# Patient Record
Sex: Female | Born: 1955 | Race: White | Hispanic: No | Smoking: Former smoker
Health system: Southern US, Community
[De-identification: ages and names within clinical notes are randomized; demographics above are authoritative.]

## PROBLEM LIST (undated history)

## (undated) DIAGNOSIS — F419 Anxiety disorder, unspecified: Secondary | ICD-10-CM

## (undated) DIAGNOSIS — J45909 Unspecified asthma, uncomplicated: Secondary | ICD-10-CM

## (undated) DIAGNOSIS — G4730 Sleep apnea, unspecified: Secondary | ICD-10-CM

## (undated) DIAGNOSIS — O24419 Gestational diabetes mellitus in pregnancy, unspecified control: Secondary | ICD-10-CM

## (undated) DIAGNOSIS — R112 Nausea with vomiting, unspecified: Secondary | ICD-10-CM

## (undated) DIAGNOSIS — K5900 Constipation, unspecified: Secondary | ICD-10-CM

## (undated) DIAGNOSIS — I10 Essential (primary) hypertension: Secondary | ICD-10-CM

## (undated) DIAGNOSIS — E079 Disorder of thyroid, unspecified: Secondary | ICD-10-CM

## (undated) DIAGNOSIS — E785 Hyperlipidemia, unspecified: Secondary | ICD-10-CM

## (undated) DIAGNOSIS — M1990 Unspecified osteoarthritis, unspecified site: Secondary | ICD-10-CM

## (undated) DIAGNOSIS — N200 Calculus of kidney: Secondary | ICD-10-CM

## (undated) DIAGNOSIS — T8859XA Other complications of anesthesia, initial encounter: Secondary | ICD-10-CM

## (undated) DIAGNOSIS — F329 Major depressive disorder, single episode, unspecified: Secondary | ICD-10-CM

## (undated) DIAGNOSIS — C73 Malignant neoplasm of thyroid gland: Secondary | ICD-10-CM

## (undated) DIAGNOSIS — C801 Malignant (primary) neoplasm, unspecified: Secondary | ICD-10-CM

## (undated) DIAGNOSIS — E039 Hypothyroidism, unspecified: Secondary | ICD-10-CM

## (undated) DIAGNOSIS — F32A Depression, unspecified: Secondary | ICD-10-CM

## (undated) DIAGNOSIS — K219 Gastro-esophageal reflux disease without esophagitis: Secondary | ICD-10-CM

## (undated) DIAGNOSIS — Z87828 Personal history of other (healed) physical injury and trauma: Secondary | ICD-10-CM

## (undated) DIAGNOSIS — T7840XA Allergy, unspecified, initial encounter: Secondary | ICD-10-CM

## (undated) DIAGNOSIS — F4310 Post-traumatic stress disorder, unspecified: Secondary | ICD-10-CM

## (undated) DIAGNOSIS — Z9889 Other specified postprocedural states: Secondary | ICD-10-CM

## (undated) DIAGNOSIS — T4145XA Adverse effect of unspecified anesthetic, initial encounter: Secondary | ICD-10-CM

## (undated) DIAGNOSIS — Z87442 Personal history of urinary calculi: Secondary | ICD-10-CM

## (undated) HISTORY — DX: Essential (primary) hypertension: I10

## (undated) HISTORY — DX: Allergy, unspecified, initial encounter: T78.40XA

## (undated) HISTORY — PX: TUBAL LIGATION: SHX77

## (undated) HISTORY — DX: Sleep apnea, unspecified: G47.30

## (undated) HISTORY — DX: Major depressive disorder, single episode, unspecified: F32.9

## (undated) HISTORY — DX: Anxiety disorder, unspecified: F41.9

## (undated) HISTORY — PX: THYROID SURGERY: SHX805

## (undated) HISTORY — DX: Hyperlipidemia, unspecified: E78.5

## (undated) HISTORY — DX: Gastro-esophageal reflux disease without esophagitis: K21.9

## (undated) HISTORY — DX: Malignant (primary) neoplasm, unspecified: C80.1

## (undated) HISTORY — DX: Unspecified osteoarthritis, unspecified site: M19.90

## (undated) HISTORY — PX: COLONOSCOPY: SHX174

## (undated) HISTORY — DX: Malignant neoplasm of thyroid gland: C73

## (undated) HISTORY — DX: Calculus of kidney: N20.0

## (undated) HISTORY — DX: Unspecified asthma, uncomplicated: J45.909

## (undated) HISTORY — DX: Depression, unspecified: F32.A

## (undated) HISTORY — DX: Disorder of thyroid, unspecified: E07.9

---

## 2007-03-20 HISTORY — PX: KNEE SURGERY: SHX244

## 2013-01-13 ENCOUNTER — Ambulatory Visit (INDEPENDENT_AMBULATORY_CARE_PROVIDER_SITE_OTHER): Payer: BC Managed Care – PPO | Admitting: Family Medicine

## 2013-01-13 ENCOUNTER — Encounter: Payer: Self-pay | Admitting: Family Medicine

## 2013-01-13 VITALS — BP 110/62 | HR 84 | Temp 98.2°F | Resp 18 | Ht 67.0 in | Wt 243.0 lb

## 2013-01-13 DIAGNOSIS — M179 Osteoarthritis of knee, unspecified: Secondary | ICD-10-CM

## 2013-01-13 DIAGNOSIS — F329 Major depressive disorder, single episode, unspecified: Secondary | ICD-10-CM

## 2013-01-13 DIAGNOSIS — I10 Essential (primary) hypertension: Secondary | ICD-10-CM

## 2013-01-13 DIAGNOSIS — E039 Hypothyroidism, unspecified: Secondary | ICD-10-CM

## 2013-01-13 DIAGNOSIS — M1710 Unilateral primary osteoarthritis, unspecified knee: Secondary | ICD-10-CM

## 2013-01-13 DIAGNOSIS — F5104 Psychophysiologic insomnia: Secondary | ICD-10-CM

## 2013-01-13 DIAGNOSIS — F411 Generalized anxiety disorder: Secondary | ICD-10-CM

## 2013-01-13 DIAGNOSIS — E669 Obesity, unspecified: Secondary | ICD-10-CM

## 2013-01-13 DIAGNOSIS — IMO0002 Reserved for concepts with insufficient information to code with codable children: Secondary | ICD-10-CM

## 2013-01-13 DIAGNOSIS — G4700 Insomnia, unspecified: Secondary | ICD-10-CM

## 2013-01-13 MED ORDER — LISINOPRIL 10 MG PO TABS: 10.0000 mg | ORAL_TABLET | Freq: Every day | ORAL | Status: DC

## 2013-01-13 MED ORDER — RANITIDINE HCL 150 MG PO TABS: 150.0000 mg | ORAL_TABLET | Freq: Two times a day (BID) | ORAL | Status: DC

## 2013-01-13 NOTE — Patient Instructions (Addendum)
Release of records Dr. Joylene Draft - [place]  Release or records from [facility] Continue current medications I will review records Work on the exercise F/U 4 months

## 2013-01-15 DIAGNOSIS — E039 Hypothyroidism, unspecified: Secondary | ICD-10-CM | POA: Insufficient documentation

## 2013-01-15 DIAGNOSIS — E66812 Obesity, class 2: Secondary | ICD-10-CM | POA: Insufficient documentation

## 2013-01-15 DIAGNOSIS — F5104 Psychophysiologic insomnia: Secondary | ICD-10-CM | POA: Insufficient documentation

## 2013-01-15 DIAGNOSIS — M179 Osteoarthritis of knee, unspecified: Secondary | ICD-10-CM | POA: Insufficient documentation

## 2013-01-15 DIAGNOSIS — M1710 Unilateral primary osteoarthritis, unspecified knee: Secondary | ICD-10-CM | POA: Insufficient documentation

## 2013-01-15 DIAGNOSIS — E669 Obesity, unspecified: Secondary | ICD-10-CM | POA: Insufficient documentation

## 2013-01-15 DIAGNOSIS — F411 Generalized anxiety disorder: Secondary | ICD-10-CM | POA: Insufficient documentation

## 2013-01-15 DIAGNOSIS — F329 Major depressive disorder, single episode, unspecified: Secondary | ICD-10-CM | POA: Insufficient documentation

## 2013-01-15 DIAGNOSIS — I10 Essential (primary) hypertension: Secondary | ICD-10-CM | POA: Insufficient documentation

## 2013-01-15 NOTE — Assessment & Plan Note (Signed)
Armour thyroid, has been stable, obtain recent labs Discussed with pt, I am not comfortable with this type of replacement hormone and if her levels become a problem will have to be transitioned to endocrinology

## 2013-01-15 NOTE — Assessment & Plan Note (Signed)
Continue wellbutrin

## 2013-01-15 NOTE — Assessment & Plan Note (Signed)
Continue OTC supplement, declines other meds

## 2013-01-15 NOTE — Progress Notes (Signed)
  Subjective:    Patient ID: Crystal Waters, female    DOB: [birth year], 57 y.o.   MRN: [id number]  HPI  Pt here to establish care, previous PCP in [place]. She is establishing care with her adult son who has Autism disorder as well. Moved due to transfer of job with [employer] Medications and history reviewed GAD/Depression - on meds many years, tried on a couple of things, wellbutrin works well, uses xanax very rarely has bottle for > 6 months ago ( 30 tablets). Had divorce from husband which caused a lot of stressors, son also with autism requiring more care. She is closer to family here. HTN- on meds past couple of years, no history of hear disease Hypothyrodism- history of thyroid cancer, s/p thyroidectomy resulting in current state. Did not do well with levothyroxine. Chronic knee pain- history of arthroscopy, told she has severe OA and history of torn meniscus, does not want surgical intervention takes NEM Egg shell membrane for pain. Did not like NSAIDS. Obesity- gained 20-30lbs past 4 months since move here.  Note gluten sensitive   TDAP 2013  Review of Systems  GEN- denies fatigue, fever, weight loss,weakness, recent illness HEENT- denies eye drainage, change in vision, nasal discharge, CVS- denies chest pain, palpitations RESP- denies SOB, cough, wheeze ABD- denies N/V, change in stools, abd pain GU- denies dysuria, hematuria, dribbling, incontinence MSK- + joint pain, muscle aches, injury Neuro- denies headache, dizziness, syncope, seizure activity      Objective:   Physical Exam GEN- NAD, alert and oriented x3, obese HEENT- PERRL, EOMI, non injected sclera, pink conjunctiva, MMM, oropharynx clear Neck- Supple,  CVS- RRR, no murmur RESP-CTAB MSK- Bilateral knees, normal inspection, mild crepitus, fair ROM, decreased flexion, no effusion EXT- No edema Psych- normal affect and mood Pulses- Radial, DP- 2+        Assessment & Plan:

## 2013-01-15 NOTE — Assessment & Plan Note (Signed)
Continue xanax 

## 2013-01-15 NOTE — Assessment & Plan Note (Signed)
Well controlled, obtain PCP records and labs

## 2013-04-19 ENCOUNTER — Other Ambulatory Visit: Payer: Self-pay | Admitting: Family Medicine

## 2013-06-18 ENCOUNTER — Other Ambulatory Visit: Payer: Self-pay | Admitting: Family Medicine

## 2013-06-18 NOTE — Telephone Encounter (Signed)
Medication filled x1 with no refills.   Requires office visit before any further refills can be given.  

## 2013-07-15 ENCOUNTER — Other Ambulatory Visit: Payer: Self-pay | Admitting: Family Medicine

## 2013-07-16 NOTE — Telephone Encounter (Signed)
Medication filled x1 with no refills.   Requires office visit before any further refills can be given.  

## 2013-08-03 ENCOUNTER — Encounter: Payer: Self-pay | Admitting: Family Medicine

## 2013-08-03 ENCOUNTER — Ambulatory Visit (INDEPENDENT_AMBULATORY_CARE_PROVIDER_SITE_OTHER): Payer: BC Managed Care – PPO | Admitting: Family Medicine

## 2013-08-03 VITALS — BP 122/78 | HR 78 | Temp 98.3°F | Resp 14 | Ht 64.5 in | Wt 249.0 lb

## 2013-08-03 DIAGNOSIS — Z1321 Encounter for screening for nutritional disorder: Secondary | ICD-10-CM

## 2013-08-03 DIAGNOSIS — F329 Major depressive disorder, single episode, unspecified: Secondary | ICD-10-CM

## 2013-08-03 DIAGNOSIS — E039 Hypothyroidism, unspecified: Secondary | ICD-10-CM

## 2013-08-03 DIAGNOSIS — M179 Osteoarthritis of knee, unspecified: Secondary | ICD-10-CM

## 2013-08-03 DIAGNOSIS — IMO0002 Reserved for concepts with insufficient information to code with codable children: Secondary | ICD-10-CM

## 2013-08-03 DIAGNOSIS — F411 Generalized anxiety disorder: Secondary | ICD-10-CM

## 2013-08-03 DIAGNOSIS — G4700 Insomnia, unspecified: Secondary | ICD-10-CM

## 2013-08-03 DIAGNOSIS — Z13228 Encounter for screening for other metabolic disorders: Secondary | ICD-10-CM

## 2013-08-03 DIAGNOSIS — I10 Essential (primary) hypertension: Secondary | ICD-10-CM

## 2013-08-03 DIAGNOSIS — M1710 Unilateral primary osteoarthritis, unspecified knee: Secondary | ICD-10-CM

## 2013-08-03 DIAGNOSIS — F5104 Psychophysiologic insomnia: Secondary | ICD-10-CM

## 2013-08-03 DIAGNOSIS — Z1329 Encounter for screening for other suspected endocrine disorder: Secondary | ICD-10-CM

## 2013-08-03 DIAGNOSIS — E669 Obesity, unspecified: Secondary | ICD-10-CM

## 2013-08-03 DIAGNOSIS — Z130 Encounter for screening for diseases of the blood and blood-forming organs and certain disorders involving the immune mechanism: Secondary | ICD-10-CM

## 2013-08-03 MED ORDER — LISINOPRIL 10 MG PO TABS: 10.0000 mg | ORAL_TABLET | Freq: Every day | ORAL | Status: DC

## 2013-08-03 MED ORDER — ALPRAZOLAM 0.25 MG PO TABS: 0.2500 mg | ORAL_TABLET | Freq: Every evening | ORAL | Status: DC | PRN

## 2013-08-03 MED ORDER — DOXEPIN HCL 25 MG PO CAPS: ORAL_CAPSULE | ORAL | Status: DC

## 2013-08-03 MED ORDER — NAPROXEN 500 MG PO TABS: 500.0000 mg | ORAL_TABLET | Freq: Two times a day (BID) | ORAL | Status: DC

## 2013-08-03 MED ORDER — THYROID 60 MG PO TABS: ORAL_TABLET | ORAL | Status: DC

## 2013-08-03 MED ORDER — BUPROPION HCL ER (XL) 300 MG PO TB24: ORAL_TABLET | ORAL | Status: DC

## 2013-08-03 MED ORDER — RANITIDINE HCL 150 MG PO TABS: 150.0000 mg | ORAL_TABLET | Freq: Two times a day (BID) | ORAL | Status: DC

## 2013-08-03 NOTE — Patient Instructions (Addendum)
We will call with lab results Try new medication for knees- Naprosyn twice a dya New medication doxepin for sleep  [facility] 3 months

## 2013-08-03 NOTE — Assessment & Plan Note (Signed)
Naprosyn twice a day to be started. Referral to orthopedic

## 2013-08-03 NOTE — Assessment & Plan Note (Signed)
Blood pressure well controlled check nonfasting labs today

## 2013-08-03 NOTE — Assessment & Plan Note (Signed)
Continue Wellbutrin. 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

## 2013-08-03 NOTE — Progress Notes (Signed)
Patient ID: Crystal Waters, female   DOB: 08/14/1955, 59 y.o.   MRN: [id number]   Subjective:    Patient ID: Crystal Waters, female    DOB: October 27, 1955, 58 y.o.   MRN: [id number]  Patient presents for Stress issues, thyroid check and B knee OA pain  patient here to followup chronic medical problems. She's due for repeat thyroid studies as well as labs. She is nonfasting today but has never had any difficulties with her lipid panel.  Number a lot of stress with work and continues to have chronic insomnia. She's tried Ambien in the past as well as trazodone she believes it other sleeping aids such as melatonin with no improvement. She's tried to be transferred to another branch within her job due to the stressful conditions at work.  She also continues to have pain in her knees. She has known osteoporosis of the knees and has been told that she will likely need a second surgery which she does not want to have. She hasn't taken some ibuprofen which gives her some relief the morning she is very stiff especially after sitting. She also feels like her legs lock up on her and occasionally give out. She would like to have referral for a consultation with a new orthopedist here she's not been seen since she was in [place]:  GEN- denies fatigue, fever, weight loss,weakness, recent illness HEENT- denies eye drainage, change in vision, nasal discharge, CVS- denies chest pain, palpitations RESP- denies SOB, cough, wheeze ABD- denies N/V, change in stools, abd pain GU- denies dysuria, hematuria, dribbling, incontinence MSK- + joint pain, muscle aches, injury Neuro- denies headache, dizziness, syncope, seizure activity       Objective:    BP 122/78  Pulse 78  Temp(Src) 98.3 F (36.8 C) (Oral)  Resp 14  Ht 5' 4.5" (1.638 m)  Wt 249 lb (112.946 kg)  BMI 42.10 kg/m2 GEN- NAD, alert and oriented x3, obese HEENT- PERRL, EOMI, non injected sclera, pink conjunctiva,  MMM, oropharynx clear Neck- Supple,  CVS- RRR, no murmur RESP-CTAB MSK- Bilateral knees, normal inspection, mild crepitus, fair ROM, decreased flexion, no effusion EXT- No edema Psych- normal affect and mood Pulses- Radial, DP- 2+         Assessment & Plan:      Problem List Items Addressed This Visit   Obesity, unspecified   OA (osteoarthritis) of knee - Primary   Relevant Medications      naproxen (NAPROSYN) tablet   Major depressive disorder   Relevant Medications      doxepin (SINEQUAN) capsule      buPROPion (WELLBUTRIN XL) 24 hr tablet      ALPRAZolam (XANAX) tablet   Hypothyroidism   Relevant Medications      thyroid (ARMOUR) tablet   Other Relevant Orders      TSH      T3, Free      T4, Free   GAD (generalized anxiety disorder)   Essential hypertension, benign   Relevant Medications      lisinopril (PRINIVIL,ZESTRIL) tablet   Other Relevant Orders      CBC with Differential      Comprehensive metabolic panel      Lipid panel   Chronic insomnia    Other Visit Diagnoses   Encounter for vitamin deficiency screening        Relevant Orders       Vitamin D, 25-hydroxy       Note:  This dictation was prepared with Dragon dictation along with smaller phrase technology. Any transcriptional errors that result from this process are unintentional.

## 2013-08-03 NOTE — Assessment & Plan Note (Signed)
Trial of doxepin 25-50 mg each bedtime

## 2013-08-04 LAB — LIPID PANEL
Cholesterol: 250 mg/dL — ABNORMAL HIGH (ref 0–200)
HDL: 70 mg/dL (ref >39)
LDL Cholesterol: 116 mg/dL — ABNORMAL HIGH (ref 0–99)
Total CHOL/HDL Ratio: 3.6 Ratio
Triglycerides: 318 mg/dL — ABNORMAL HIGH (ref <150)
VLDL: 64 mg/dL — ABNORMAL HIGH (ref 0–40)

## 2013-08-04 LAB — CBC WITH DIFFERENTIAL/PLATELET
Basophils Absolute: 0 10*3/uL (ref 0.0–0.1)
Basophils Relative: 0 % (ref 0–1)
Eosinophils Absolute: 0.1 10*3/uL (ref 0.0–0.7)
Eosinophils Relative: 2 % (ref 0–5)
HEMATOCRIT: 40.3 % (ref 36.0–46.0)
Hemoglobin: 13.5 g/dL (ref 12.0–15.0)
Lymphocytes Relative: 30 % (ref 12–46)
Lymphs Abs: 2.1 10*3/uL (ref 0.7–4.0)
MCH: 30 pg (ref 26.0–34.0)
MCHC: 33.5 g/dL (ref 30.0–36.0)
MCV: 89.6 fL (ref 78.0–100.0)
MONO ABS: 0.6 10*3/uL (ref 0.1–1.0)
Monocytes Relative: 9 % (ref 3–12)
Neutro Abs: 4.1 10*3/uL (ref 1.7–7.7)
Neutrophils Relative %: 59 % (ref 43–77)
Platelets: 222 10*3/uL (ref 150–400)
RBC: 4.5 MIL/uL (ref 3.87–5.11)
RDW: 13.8 % (ref 11.5–15.5)
WBC: 7 10*3/uL (ref 4.0–10.5)

## 2013-08-04 LAB — COMPREHENSIVE METABOLIC PANEL
ALBUMIN: 4.1 g/dL (ref 3.5–5.2)
ALT: 15 U/L (ref 0–35)
AST: 12 U/L (ref 0–37)
Alkaline Phosphatase: 55 U/L (ref 39–117)
BUN: 21 mg/dL (ref 6–23)
CALCIUM: 9.5 mg/dL (ref 8.4–10.5)
CHLORIDE: 104 meq/L (ref 96–112)
CO2: 28 mEq/L (ref 19–32)
Creat: 1.08 mg/dL (ref 0.50–1.10)
Glucose, Bld: 80 mg/dL (ref 70–99)
Potassium: 4.6 mEq/L (ref 3.5–5.3)
Sodium: 140 mEq/L (ref 135–145)
Total Bilirubin: 0.3 mg/dL (ref 0.2–1.2)
Total Protein: 6.8 g/dL (ref 6.0–8.3)

## 2013-08-04 LAB — T3, FREE: T3 FREE: 3 pg/mL (ref 2.3–4.2)

## 2013-08-04 LAB — VITAMIN D 25 HYDROXY (VIT D DEFICIENCY, FRACTURES): VIT D 25 HYDROXY: 29 ng/mL — AB (ref 30–89)

## 2013-08-04 LAB — TSH: TSH: 1.78 u[IU]/mL (ref 0.350–4.500)

## 2013-08-04 LAB — T4, FREE: Free T4: 0.83 ng/dL (ref 0.80–1.80)

## 2013-08-23 ENCOUNTER — Other Ambulatory Visit: Payer: Self-pay | Admitting: Family Medicine

## 2013-08-24 NOTE — Telephone Encounter (Signed)
Refill appropriate and filled per protocol. 

## 2013-09-20 ENCOUNTER — Other Ambulatory Visit: Payer: Self-pay | Admitting: Family Medicine

## 2013-09-21 NOTE — Telephone Encounter (Signed)
Refill appropriate and filled per protocol. 

## 2013-10-20 ENCOUNTER — Other Ambulatory Visit: Payer: Self-pay | Admitting: Family Medicine

## 2013-10-21 NOTE — Telephone Encounter (Signed)
Refill appropriate and filled per protocol. 

## 2013-10-25 ENCOUNTER — Other Ambulatory Visit: Payer: Self-pay | Admitting: Family Medicine

## 2013-10-26 NOTE — Telephone Encounter (Signed)
Refill appropriate and filled per protocol. 

## 2013-10-27 ENCOUNTER — Telehealth: Payer: Self-pay | Admitting: *Deleted

## 2013-10-27 NOTE — Telephone Encounter (Signed)
Call pt she will need to go to another pharmacy, since she in on the NP thyroid and not the synthroid,okay to provide a new script

## 2013-10-27 NOTE — Telephone Encounter (Signed)
Call placed to pharmacy.   Was advised that medication is not on back order, the manufacturer is out of stock.   Medication should be in pharmacy within 2 days.   Will F/U on Thursday.

## 2013-10-27 NOTE — Telephone Encounter (Signed)
Received fax from pharmacy.   Reports that NP thyroid is on back order from the manufacturer and is unavailable at this time.   Requested to have MD change prescription.   MD please advise.

## 2013-10-28 NOTE — Telephone Encounter (Signed)
Call placed to patient. LMTRC.  

## 2013-10-28 NOTE — Telephone Encounter (Signed)
Did you speak to pt, so that she knows her options, I think it is best to keep her on the same brand as it is an alternative thyroid, other option is armour thyroid which should work the same.  She can either go to a different pharmacy or switch to Armour same dose

## 2013-10-28 NOTE — Telephone Encounter (Signed)
Christy from [facility] calling you regarding this patients medication please call her back at [phone]

## 2013-10-28 NOTE — Telephone Encounter (Signed)
Returned call to [facility].   Reports that the NP thyroid is on backorder per manufacturer, but there are (2) alternatives to the natural thyroid: Armour Thyroid and WP Thyroid.   States that both medications are also measured in grains and work in the same manner.   MD please advise.

## 2013-10-29 ENCOUNTER — Encounter: Payer: Self-pay | Admitting: Family Medicine

## 2013-10-29 ENCOUNTER — Ambulatory Visit (INDEPENDENT_AMBULATORY_CARE_PROVIDER_SITE_OTHER): Payer: BC Managed Care – PPO | Admitting: Family Medicine

## 2013-10-29 VITALS — BP 126/74 | HR 66 | Temp 98.2°F | Resp 12 | Ht 66.0 in | Wt 254.0 lb

## 2013-10-29 DIAGNOSIS — G4700 Insomnia, unspecified: Secondary | ICD-10-CM

## 2013-10-29 DIAGNOSIS — E669 Obesity, unspecified: Secondary | ICD-10-CM

## 2013-10-29 DIAGNOSIS — D229 Melanocytic nevi, unspecified: Secondary | ICD-10-CM | POA: Insufficient documentation

## 2013-10-29 DIAGNOSIS — I10 Essential (primary) hypertension: Secondary | ICD-10-CM

## 2013-10-29 DIAGNOSIS — E038 Other specified hypothyroidism: Secondary | ICD-10-CM

## 2013-10-29 DIAGNOSIS — F5104 Psychophysiologic insomnia: Secondary | ICD-10-CM

## 2013-10-29 DIAGNOSIS — D239 Other benign neoplasm of skin, unspecified: Secondary | ICD-10-CM

## 2013-10-29 MED ORDER — THYROID 60 MG PO TABS: 150.0000 mg | ORAL_TABLET | Freq: Every day | ORAL | Status: DC

## 2013-10-29 MED ORDER — NAPROXEN 500 MG PO TABS: 500.0000 mg | ORAL_TABLET | Freq: Two times a day (BID) | ORAL | Status: DC

## 2013-10-29 MED ORDER — BUPROPION HCL ER (XL) 300 MG PO TB24: ORAL_TABLET | ORAL | Status: DC

## 2013-10-29 MED ORDER — DOXEPIN HCL 25 MG PO CAPS: ORAL_CAPSULE | ORAL | Status: DC

## 2013-10-29 MED ORDER — LISINOPRIL 10 MG PO TABS: ORAL_TABLET | ORAL | Status: DC

## 2013-10-29 NOTE — Assessment & Plan Note (Signed)
Doing well on doxepin

## 2013-10-29 NOTE — Telephone Encounter (Signed)
Patient has appointment scheduled on 10/29/2013. Will discuss with patient then.

## 2013-10-29 NOTE — Assessment & Plan Note (Signed)
Well controlled, no change to meds 

## 2013-10-29 NOTE — Assessment & Plan Note (Signed)
Dermatology referral 

## 2013-10-29 NOTE — Assessment & Plan Note (Signed)
Change to armour thyroid

## 2013-10-29 NOTE — Progress Notes (Signed)
Patient ID: Crystal Waters, female   DOB: 01/06/1956, 58 y.o.   MRN: [id number]   Subjective:    Patient ID: Crystal Waters, female    DOB: Nov 30, 1955, 58 y.o.   MRN: [id number]  Patient presents for 3 month F/U and Assess mole on L leg Pt here to f/u chronic medical problems, concerned about 2 moles on her legs that appear darker and have changed shape and size. She was followed by derm before she moved here.  Her NP thyroid is on backorder, will change to Armour thyroid HTN- taking meds as prescribed, she has not been very adherent to low fat diet/low carb diet     Review Of Systems:  GEN- denies fatigue, fever, weight loss,weakness, recent illness HEENT- denies eye drainage, change in vision, nasal discharge, CVS- denies chest pain, palpitations RESP- denies SOB, cough, wheeze ABD- denies N/V, change in stools, abd pain GU- denies dysuria, hematuria, dribbling, incontinence MSK- + joint pain, muscle aches, injury Neuro- denies headache, dizziness, syncope, seizure activity       Objective:    BP 126/74  Pulse 66  Temp(Src) 98.2 F (36.8 C) (Oral)  Resp 12  Ht 5\' 6"  (1.676 m)  Wt 254 lb (115.214 kg)  BMI 41.02 kg/m2 GEN- NAD, alert and oriented x3 HEENT- PERRL, EOMI, non injected sclera, pink conjunctiva, MMM, oropharynx clear Neck- Supple, no thyromegaly CVS- RRR, no murmur RESP-CTAB EXT- No edema Pulses- Radial, DP- 2+ Skin- multiple nevi, irregular enlongted hyperpigemented nevus right lower leg inner aspect and 1 lesion on left thigh       Assessment & Plan:      Problem List Items Addressed This Visit   Obesity, unspecified - Primary   Hypothyroidism   Relevant Medications      thyroid (ARMOUR) tablet   Essential hypertension, benign   Relevant Medications      lisinopril (PRINIVIL,ZESTRIL) tablet   Chronic insomnia   Atypical nevi      Note: This dictation was prepared with Dragon dictation along with smaller phrase technology. Any transcriptional  errors that result from this process are unintentional.

## 2013-10-29 NOTE — Patient Instructions (Signed)
Return for fasting labs next Wednesday Continue current medications Work on the low fat , low carb diet Referral to dermatology F/U 4 months

## 2013-10-29 NOTE — Telephone Encounter (Signed)
Patient in office and states that Armour thyroid works the same as NP thyroid.   Prescription sent to pharmacy.

## 2013-11-04 ENCOUNTER — Ambulatory Visit: Payer: BC Managed Care – PPO | Admitting: Family Medicine

## 2013-11-06 ENCOUNTER — Other Ambulatory Visit: Payer: BC Managed Care – PPO

## 2013-11-06 ENCOUNTER — Other Ambulatory Visit: Payer: Self-pay | Admitting: Family Medicine

## 2013-11-06 DIAGNOSIS — I10 Essential (primary) hypertension: Secondary | ICD-10-CM

## 2013-11-06 LAB — COMPREHENSIVE METABOLIC PANEL
ALT: 13 U/L (ref 0–35)
AST: 14 U/L (ref 0–37)
Albumin: 4.1 g/dL (ref 3.5–5.2)
Alkaline Phosphatase: 55 U/L (ref 39–117)
BILIRUBIN TOTAL: 0.3 mg/dL (ref 0.2–1.2)
BUN: 23 mg/dL (ref 6–23)
CALCIUM: 9.3 mg/dL (ref 8.4–10.5)
CHLORIDE: 106 meq/L (ref 96–112)
CO2: 24 meq/L (ref 19–32)
CREATININE: 0.9 mg/dL (ref 0.50–1.10)
Glucose, Bld: 108 mg/dL — ABNORMAL HIGH (ref 70–99)
Potassium: 5 mEq/L (ref 3.5–5.3)
Sodium: 140 mEq/L (ref 135–145)
Total Protein: 6.7 g/dL (ref 6.0–8.3)

## 2013-11-06 LAB — CBC WITH DIFFERENTIAL/PLATELET
BASOS ABS: 0 10*3/uL (ref 0.0–0.1)
Basophils Relative: 0 % (ref 0–1)
EOS PCT: 3 % (ref 0–5)
Eosinophils Absolute: 0.2 10*3/uL (ref 0.0–0.7)
HCT: 41.1 % (ref 36.0–46.0)
Hemoglobin: 13.6 g/dL (ref 12.0–15.0)
LYMPHS ABS: 1.7 10*3/uL (ref 0.7–4.0)
Lymphocytes Relative: 33 % (ref 12–46)
MCH: 29.2 pg (ref 26.0–34.0)
MCHC: 33.1 g/dL (ref 30.0–36.0)
MCV: 88.2 fL (ref 78.0–100.0)
MONO ABS: 0.5 10*3/uL (ref 0.1–1.0)
Monocytes Relative: 10 % (ref 3–12)
Neutro Abs: 2.8 10*3/uL (ref 1.7–7.7)
Neutrophils Relative %: 54 % (ref 43–77)
PLATELETS: 203 10*3/uL (ref 150–400)
RBC: 4.66 MIL/uL (ref 3.87–5.11)
RDW: 14 % (ref 11.5–15.5)
WBC: 5.1 10*3/uL (ref 4.0–10.5)

## 2013-11-06 LAB — LIPID PANEL
CHOL/HDL RATIO: 3 ratio
CHOLESTEROL: 213 mg/dL — AB (ref 0–200)
HDL: 71 mg/dL (ref >39)
LDL Cholesterol: 124 mg/dL — ABNORMAL HIGH (ref 0–99)
Triglycerides: 90 mg/dL (ref <150)
VLDL: 18 mg/dL (ref 0–40)

## 2013-11-09 LAB — HEMOGLOBIN A1C
Hgb A1c MFr Bld: 5.7 % — ABNORMAL HIGH (ref <5.7)
MEAN PLASMA GLUCOSE: 117 mg/dL — AB (ref <117)

## 2013-11-24 ENCOUNTER — Other Ambulatory Visit: Payer: Self-pay | Admitting: Family Medicine

## 2014-01-07 ENCOUNTER — Other Ambulatory Visit: Payer: Self-pay | Admitting: Family Medicine

## 2014-01-08 NOTE — Telephone Encounter (Signed)
Refill appropriate and filled per protocol. 

## 2014-01-27 ENCOUNTER — Other Ambulatory Visit: Payer: Self-pay | Admitting: Family Medicine

## 2014-01-27 NOTE — Telephone Encounter (Signed)
Medication refilled per protocol. 

## 2014-03-02 ENCOUNTER — Other Ambulatory Visit: Payer: BC Managed Care – PPO

## 2014-03-02 DIAGNOSIS — I10 Essential (primary) hypertension: Secondary | ICD-10-CM

## 2014-03-02 DIAGNOSIS — E039 Hypothyroidism, unspecified: Secondary | ICD-10-CM

## 2014-03-02 DIAGNOSIS — R739 Hyperglycemia, unspecified: Secondary | ICD-10-CM

## 2014-03-02 DIAGNOSIS — Z79899 Other long term (current) drug therapy: Secondary | ICD-10-CM

## 2014-03-02 LAB — LIPID PANEL
CHOL/HDL RATIO: 2.8 ratio
Cholesterol: 213 mg/dL — ABNORMAL HIGH (ref 0–200)
HDL: 76 mg/dL (ref >39)
LDL Cholesterol: 122 mg/dL — ABNORMAL HIGH (ref 0–99)
Triglycerides: 75 mg/dL (ref <150)
VLDL: 15 mg/dL (ref 0–40)

## 2014-03-02 LAB — HEMOGLOBIN A1C
Hgb A1c MFr Bld: 5.6 % (ref <5.7)
Mean Plasma Glucose: 114 mg/dL (ref <117)

## 2014-03-02 LAB — COMPLETE METABOLIC PANEL WITH GFR
ALK PHOS: 59 U/L (ref 39–117)
ALT: 13 U/L (ref 0–35)
AST: 15 U/L (ref 0–37)
Albumin: 4 g/dL (ref 3.5–5.2)
BUN: 16 mg/dL (ref 6–23)
CALCIUM: 9.6 mg/dL (ref 8.4–10.5)
CO2: 29 meq/L (ref 19–32)
Chloride: 106 mEq/L (ref 96–112)
Creat: 0.97 mg/dL (ref 0.50–1.10)
GFR, EST AFRICAN AMERICAN: 74 mL/min
GFR, EST NON AFRICAN AMERICAN: 65 mL/min
GLUCOSE: 93 mg/dL (ref 70–99)
POTASSIUM: 4.7 meq/L (ref 3.5–5.3)
Sodium: 142 mEq/L (ref 135–145)
TOTAL PROTEIN: 6.5 g/dL (ref 6.0–8.3)
Total Bilirubin: 0.4 mg/dL (ref 0.2–1.2)

## 2014-03-02 LAB — TSH: TSH: 1.63 u[IU]/mL (ref 0.350–4.500)

## 2014-03-09 ENCOUNTER — Ambulatory Visit (INDEPENDENT_AMBULATORY_CARE_PROVIDER_SITE_OTHER): Payer: BC Managed Care – PPO | Admitting: Family Medicine

## 2014-03-09 ENCOUNTER — Encounter: Payer: Self-pay | Admitting: Family Medicine

## 2014-03-09 VITALS — BP 142/88 | HR 78 | Temp 98.4°F | Resp 16 | Ht 66.0 in | Wt 248.0 lb

## 2014-03-09 DIAGNOSIS — F5104 Psychophysiologic insomnia: Secondary | ICD-10-CM

## 2014-03-09 DIAGNOSIS — G4700 Insomnia, unspecified: Secondary | ICD-10-CM

## 2014-03-09 DIAGNOSIS — E669 Obesity, unspecified: Secondary | ICD-10-CM

## 2014-03-09 DIAGNOSIS — E038 Other specified hypothyroidism: Secondary | ICD-10-CM

## 2014-03-09 DIAGNOSIS — I10 Essential (primary) hypertension: Secondary | ICD-10-CM

## 2014-03-09 MED ORDER — LISINOPRIL 10 MG PO TABS: ORAL_TABLET | ORAL | Status: DC

## 2014-03-09 MED ORDER — BUPROPION HCL ER (XL) 300 MG PO TB24: 300.0000 mg | ORAL_TABLET | Freq: Every day | ORAL | Status: DC

## 2014-03-09 MED ORDER — ALPRAZOLAM 0.25 MG PO TABS: 0.2500 mg | ORAL_TABLET | Freq: Every evening | ORAL | Status: DC | PRN

## 2014-03-09 MED ORDER — DOXEPIN HCL 25 MG PO CAPS: ORAL_CAPSULE | ORAL | Status: DC

## 2014-03-09 NOTE — Assessment & Plan Note (Signed)
Continue doxepin, if she has any other episodes  With amnesia, sleep walking will discontinue

## 2014-03-09 NOTE — Progress Notes (Signed)
Patient ID: Crystal Waters, female   DOB: [birth year], 58 y.o.   MRN: [id number]   Subjective:    Patient ID: Crystal Waters, female    DOB: 1955-04-11, 58 y.o.   MRN: [id number]  Patient presents for F/U  patient here to follow chronic medical problems. She has no specific concerns today. She still going to orthopedics airplane for Synvisc shot in her knees. She is back on her regular in NP thyroid medication.  her mood has been good with her current medications. She does not use her Xanax very often. Declines flu shot   her doxepin works very well for her. She did experience one night when she did not remember a phone call with a friend but this was after she had taken her medication. She has not had any sleepwalking her other abnormal behavior  Review Of Systems:  GEN- denies fatigue, fever, weight loss,weakness, recent illness HEENT- denies eye drainage, change in vision, nasal discharge, CVS- denies chest pain, palpitations RESP- denies SOB, cough, wheeze ABD- denies N/V, change in stools, abd pain GU- denies dysuria, hematuria, dribbling, incontinence MSK- denies joint pain, muscle aches, injury Neuro- denies headache, dizziness, syncope, seizure activity       Objective:    BP 142/88 mmHg  Pulse 78  Temp(Src) 98.4 F (36.9 C) (Oral)  Resp 16  Ht 5\' 6"  (1.676 m)  Wt 248 lb (112.492 kg)  BMI 40.05 kg/m2 GEN- NAD, alert and oriented x3 HEENT- PERRL, EOMI, non injected sclera, pink conjunctiva, MMM, oropharynx clear Neck- Supple, no thyromegaly CVS- RRR, no murmur RESP-CTAB Psych- normal affect and mood EXT- No edema Pulses- Radial 2+        Assessment & Plan:      Problem List Items Addressed This Visit      Unprioritized   Hypothyroidism   Essential hypertension, benign - Primary   Chronic insomnia      Note: This dictation was prepared with Dragon dictation along with smaller phrase technology. Any transcriptional errors that result from this process are  unintentional.

## 2014-03-09 NOTE — Patient Instructions (Signed)
Continue current medications Keep working on weight loss F/U 4 months

## 2014-03-09 NOTE — Assessment & Plan Note (Signed)
BP elevated a little today, most readings, normal , no change to dose

## 2014-03-09 NOTE — Assessment & Plan Note (Signed)
TSH normal, continue current dose.

## 2014-03-09 NOTE — Assessment & Plan Note (Signed)
Weight loss noted  

## 2014-04-05 ENCOUNTER — Other Ambulatory Visit: Payer: Self-pay | Admitting: Family Medicine

## 2014-04-05 NOTE — Telephone Encounter (Signed)
Refill appropriate and filled per protocol. 

## 2014-04-23 ENCOUNTER — Other Ambulatory Visit: Payer: Self-pay | Admitting: Family Medicine

## 2014-04-23 NOTE — Telephone Encounter (Signed)
Refill appropriate and filled per protocol. 

## 2014-07-13 ENCOUNTER — Ambulatory Visit: Payer: Self-pay | Admitting: Family Medicine

## 2014-07-14 ENCOUNTER — Ambulatory Visit: Payer: Self-pay | Admitting: Family Medicine

## 2014-07-21 ENCOUNTER — Other Ambulatory Visit: Payer: Self-pay | Admitting: Family Medicine

## 2014-07-21 ENCOUNTER — Other Ambulatory Visit: Payer: Self-pay

## 2014-07-21 DIAGNOSIS — I10 Essential (primary) hypertension: Secondary | ICD-10-CM

## 2014-07-21 DIAGNOSIS — E669 Obesity, unspecified: Secondary | ICD-10-CM

## 2014-07-21 DIAGNOSIS — Z79899 Other long term (current) drug therapy: Secondary | ICD-10-CM

## 2014-07-21 DIAGNOSIS — F411 Generalized anxiety disorder: Secondary | ICD-10-CM

## 2014-07-21 DIAGNOSIS — E038 Other specified hypothyroidism: Secondary | ICD-10-CM

## 2014-07-21 LAB — CBC WITH DIFFERENTIAL/PLATELET
BASOS ABS: 0 10*3/uL (ref 0.0–0.1)
BASOS PCT: 0 % (ref 0–1)
Eosinophils Absolute: 0.2 10*3/uL (ref 0.0–0.7)
Eosinophils Relative: 3 % (ref 0–5)
HEMATOCRIT: 42.1 % (ref 36.0–46.0)
Hemoglobin: 13.5 g/dL (ref 12.0–15.0)
Lymphocytes Relative: 32 % (ref 12–46)
Lymphs Abs: 1.8 10*3/uL (ref 0.7–4.0)
MCH: 28.9 pg (ref 26.0–34.0)
MCHC: 32.1 g/dL (ref 30.0–36.0)
MCV: 90.1 fL (ref 78.0–100.0)
MPV: 11.2 fL (ref 8.6–12.4)
Monocytes Absolute: 0.5 10*3/uL (ref 0.1–1.0)
Monocytes Relative: 9 % (ref 3–12)
NEUTROS ABS: 3.1 10*3/uL (ref 1.7–7.7)
Neutrophils Relative %: 56 % (ref 43–77)
PLATELETS: 194 10*3/uL (ref 150–400)
RBC: 4.67 MIL/uL (ref 3.87–5.11)
RDW: 13.6 % (ref 11.5–15.5)
WBC: 5.5 10*3/uL (ref 4.0–10.5)

## 2014-07-21 LAB — COMPLETE METABOLIC PANEL WITH GFR
ALBUMIN: 3.7 g/dL (ref 3.5–5.2)
ALK PHOS: 46 U/L (ref 39–117)
ALT: 17 U/L (ref 0–35)
AST: 15 U/L (ref 0–37)
BUN: 23 mg/dL (ref 6–23)
CALCIUM: 8.9 mg/dL (ref 8.4–10.5)
CO2: 24 mEq/L (ref 19–32)
CREATININE: 0.97 mg/dL (ref 0.50–1.10)
Chloride: 107 mEq/L (ref 96–112)
GFR, Est African American: 74 mL/min
GFR, Est Non African American: 65 mL/min
Glucose, Bld: 92 mg/dL (ref 70–99)
Potassium: 4.5 mEq/L (ref 3.5–5.3)
Sodium: 141 mEq/L (ref 135–145)
Total Bilirubin: 0.4 mg/dL (ref 0.2–1.2)
Total Protein: 6.5 g/dL (ref 6.0–8.3)

## 2014-07-21 LAB — HEMOGLOBIN A1C
HEMOGLOBIN A1C: 5.7 % — AB (ref <5.7)
MEAN PLASMA GLUCOSE: 117 mg/dL — AB (ref <117)

## 2014-07-21 LAB — LIPID PANEL
Cholesterol: 197 mg/dL (ref 0–200)
HDL: 74 mg/dL (ref >=46)
LDL CALC: 104 mg/dL — AB (ref 0–99)
TRIGLYCERIDES: 94 mg/dL (ref <150)
Total CHOL/HDL Ratio: 2.7 Ratio
VLDL: 19 mg/dL (ref 0–40)

## 2014-07-21 LAB — TSH: TSH: 5.686 u[IU]/mL — ABNORMAL HIGH (ref 0.350–4.500)

## 2014-07-23 ENCOUNTER — Encounter: Payer: Self-pay | Admitting: Family Medicine

## 2014-07-23 ENCOUNTER — Ambulatory Visit (INDEPENDENT_AMBULATORY_CARE_PROVIDER_SITE_OTHER): Payer: BLUE CROSS/BLUE SHIELD | Admitting: Family Medicine

## 2014-07-23 VITALS — BP 130/68 | HR 68 | Temp 98.4°F | Resp 14 | Ht 67.0 in | Wt 254.0 lb

## 2014-07-23 DIAGNOSIS — E669 Obesity, unspecified: Secondary | ICD-10-CM

## 2014-07-23 DIAGNOSIS — I10 Essential (primary) hypertension: Secondary | ICD-10-CM

## 2014-07-23 DIAGNOSIS — G4700 Insomnia, unspecified: Secondary | ICD-10-CM | POA: Diagnosis not present

## 2014-07-23 DIAGNOSIS — F5104 Psychophysiologic insomnia: Secondary | ICD-10-CM

## 2014-07-23 DIAGNOSIS — E038 Other specified hypothyroidism: Secondary | ICD-10-CM

## 2014-07-23 LAB — T4, FREE: FREE T4: 0.77 ng/dL — AB (ref 0.80–1.80)

## 2014-07-23 LAB — T3, FREE: T3 FREE: 2.4 pg/mL (ref 2.3–4.2)

## 2014-07-23 NOTE — Assessment & Plan Note (Addendum)
T3 and T4 pending. We may need to adjust her Armour Thyroid and if that is the case I will call in endocrinology as I do not feel comfortable adjusting the alternative hormones I also reiterated how she is to take her thyroid medication before any other medications.

## 2014-07-23 NOTE — Patient Instructions (Signed)
Continue current medications We will call with thyroid medication and adjustments Take the thyroid medication by itself nothing else within 30 minute of the dose F/U 6 months for PHYSICAL

## 2014-07-23 NOTE — Assessment & Plan Note (Signed)
Blood pressure is well-controlled and change in medication

## 2014-07-23 NOTE — Assessment & Plan Note (Signed)
Unfortunately she is significantly overeating at night time and very unhealthy foods as well. With her blood pressure as well as her regular medication she needs to be able to evenly space out her meals and her lunch break that she is given his extremely early for an 8-6pm job. I've written a letter discussing my concerns and need for later lunch for her to help with her glucose metabolism and to help with her medication management.

## 2014-07-23 NOTE — Assessment & Plan Note (Signed)
Continue doxepin as needed

## 2014-07-23 NOTE — Progress Notes (Signed)
Patient ID: Crystal Waters, female   DOB: 01-05-1956, 59 y.o.   MRN: [id number]   Subjective:    Patient ID: Crystal Waters, female    DOB: 30-Sep-1955, 59 y.o.   MRN: [id number]  Patient presents for 4 month F/U  Patient follow-up chronic medical problems. She has no particular concerns today. She is upset that she continues to gain weight. Unfortunately with her job they're making her eat lunch at 10:30 in the morning before she goes about 6 or 7 eyes before eating anything else and then overeats significantly at home with a lot of junk food and snack food plus her regular dinner. She is not exercising on a regular basis either. She has done some improvement in her joints from her injections that she had done a few months ago. On review of medications there no concern with the meds. We also reviewed fasting labs she is on Armour Thyroid which she has been on the same dose for many years however her TSH was a little elevated free T3 and T4 now pending.   Review Of Systems:  GEN- denies fatigue, fever, weight loss,weakness, recent illness HEENT- denies eye drainage, change in vision, nasal discharge, CVS- denies chest pain, palpitations RESP- denies SOB, cough, wheeze ABD- denies N/V, change in stools, abd pain GU- denies dysuria, hematuria, dribbling, incontinence MSK- +joint pain, muscle aches, injury Neuro- denies headache, dizziness, syncope, seizure activity       Objective:    BP 130/68 mmHg  Pulse 68  Temp(Src) 98.4 F (36.9 C) (Oral)  Resp 14  Ht 5\' 7"  (1.702 m)  Wt 254 lb (115.214 kg)  BMI 39.77 kg/m2 GEN- NAD, alert and oriented x3.obese HEENT- PERRL, EOMI, non injected sclera, pink conjunctiva, MMM, oropharynx clear Neck- Supple, no thyromegaly, no LAD CVS- RRR, no murmur RESP-CTAB EXT- No edema Pulses- Radial, DP- 2+        Assessment & Plan:      Problem List Items Addressed This Visit    Obesity - Primary   Hypothyroidism   Essential hypertension, benign    Chronic insomnia      Note: This dictation was prepared with Dragon dictation along with smaller phrase technology. Any transcriptional errors that result from this process are unintentional.

## 2014-07-28 ENCOUNTER — Other Ambulatory Visit: Payer: Self-pay | Admitting: *Deleted

## 2014-07-28 DIAGNOSIS — E039 Hypothyroidism, unspecified: Secondary | ICD-10-CM

## 2014-09-21 ENCOUNTER — Telehealth: Payer: Self-pay | Admitting: Family Medicine

## 2014-09-21 NOTE — Telephone Encounter (Signed)
Call placed to patient. LMTRC.  

## 2014-09-21 NOTE — Telephone Encounter (Signed)
Please call patient back as his messages left on Saturday and today is Tuesday likely her symptoms have resolved and not I need to update on what's going on

## 2014-09-21 NOTE — Telephone Encounter (Signed)
MD please advise

## 2014-09-21 NOTE — Telephone Encounter (Signed)
[phone] PT called and left VM stating that on Saturday she was out in the sun to long and she believes she may had got dehydrated and she has been sick on her stomach since then and she is wanting to know what she needs to do.

## 2014-09-22 NOTE — Telephone Encounter (Signed)
Call placed to patient. LMTRC.  

## 2014-09-23 NOTE — Telephone Encounter (Signed)
Call placed to patient.   States that she noted symptoms after being outside over the weekend and assumed she was dehydrated. Reports that it began with upset stomach, and has since progressed to overall weakness.   Reports that she has not had much of an appetite, and has felt fatigued since this weekend. States that she was out of work on 09/22/2014, and returned on 09/23/2014, but still feels very tired and weak.   Reports that she is improving slowly, so she thinks it may have been GI issue and not dehydration.   Advised to continue to push fluids, especially if she has no appetite, and increase rest. If S/Sx persist, patient should contact office on Monday for OV.   MD to be made aware.

## 2014-09-24 NOTE — Telephone Encounter (Signed)
Agree with above 

## 2014-10-02 ENCOUNTER — Other Ambulatory Visit: Payer: Self-pay | Admitting: Family Medicine

## 2014-10-05 NOTE — Telephone Encounter (Signed)
Medication refilled per protocol. 

## 2014-10-05 NOTE — Telephone Encounter (Signed)
Ok to refill 

## 2014-10-25 ENCOUNTER — Other Ambulatory Visit: Payer: Self-pay | Admitting: Family Medicine

## 2014-10-25 DIAGNOSIS — Z1231 Encounter for screening mammogram for malignant neoplasm of breast: Secondary | ICD-10-CM

## 2014-10-29 ENCOUNTER — Ambulatory Visit (HOSPITAL_COMMUNITY)
Admission: RE | Admit: 2014-10-29 | Discharge: 2014-10-29 | Disposition: A | Discharge status: Home / Self Care | Payer: BLUE CROSS/BLUE SHIELD | Source: Ambulatory Visit | Attending: Family Medicine | Admitting: Family Medicine

## 2014-10-29 DIAGNOSIS — Z1231 Encounter for screening mammogram for malignant neoplasm of breast: Secondary | ICD-10-CM | POA: Diagnosis not present

## 2014-11-07 ENCOUNTER — Other Ambulatory Visit: Payer: Self-pay | Admitting: Family Medicine

## 2014-11-08 NOTE — Telephone Encounter (Signed)
Patient requires labs before any further refills can be given.   Medication filled x1 with no refills.

## 2014-12-08 ENCOUNTER — Other Ambulatory Visit: Payer: Self-pay | Admitting: Family Medicine

## 2014-12-08 ENCOUNTER — Other Ambulatory Visit: Payer: BLUE CROSS/BLUE SHIELD

## 2014-12-08 DIAGNOSIS — E039 Hypothyroidism, unspecified: Secondary | ICD-10-CM

## 2014-12-08 LAB — T4, FREE: FREE T4: 0.62 ng/dL — AB (ref 0.80–1.80)

## 2014-12-08 LAB — TSH: TSH: 1.171 u[IU]/mL (ref 0.350–4.500)

## 2014-12-08 LAB — T3, FREE: T3 FREE: 2.3 pg/mL (ref 2.3–4.2)

## 2014-12-09 NOTE — Telephone Encounter (Signed)
Refill appropriate and filled per protocol. 

## 2015-01-07 ENCOUNTER — Other Ambulatory Visit: Payer: Self-pay | Admitting: Family Medicine

## 2015-01-07 NOTE — Telephone Encounter (Signed)
Medication refilled per protocol. 

## 2015-02-07 ENCOUNTER — Encounter: Payer: Self-pay | Admitting: Family Medicine

## 2015-02-07 ENCOUNTER — Ambulatory Visit (INDEPENDENT_AMBULATORY_CARE_PROVIDER_SITE_OTHER): Payer: BLUE CROSS/BLUE SHIELD | Admitting: Family Medicine

## 2015-02-07 VITALS — BP 136/72 | HR 74 | Temp 98.7°F | Resp 14 | Ht 67.0 in | Wt 254.0 lb

## 2015-02-07 DIAGNOSIS — Z0000 Encounter for general adult medical examination without abnormal findings: Secondary | ICD-10-CM | POA: Diagnosis not present

## 2015-02-07 DIAGNOSIS — Z1159 Encounter for screening for other viral diseases: Secondary | ICD-10-CM

## 2015-02-07 DIAGNOSIS — I10 Essential (primary) hypertension: Secondary | ICD-10-CM

## 2015-02-07 DIAGNOSIS — Z124 Encounter for screening for malignant neoplasm of cervix: Secondary | ICD-10-CM | POA: Diagnosis not present

## 2015-02-07 DIAGNOSIS — R7302 Impaired glucose tolerance (oral): Secondary | ICD-10-CM | POA: Diagnosis not present

## 2015-02-07 DIAGNOSIS — R7303 Prediabetes: Secondary | ICD-10-CM | POA: Insufficient documentation

## 2015-02-07 DIAGNOSIS — E669 Obesity, unspecified: Secondary | ICD-10-CM

## 2015-02-07 DIAGNOSIS — F3342 Major depressive disorder, recurrent, in full remission: Secondary | ICD-10-CM | POA: Diagnosis not present

## 2015-02-07 MED ORDER — LISINOPRIL 10 MG PO TABS: 10.0000 mg | ORAL_TABLET | Freq: Every day | ORAL | Status: DC

## 2015-02-07 MED ORDER — BUPROPION HCL ER (XL) 300 MG PO TB24: 300.0000 mg | ORAL_TABLET | Freq: Every day | ORAL | Status: DC

## 2015-02-07 MED ORDER — ALPRAZOLAM 0.25 MG PO TABS: 0.2500 mg | ORAL_TABLET | Freq: Every evening | ORAL | Status: DC | PRN

## 2015-02-07 NOTE — Progress Notes (Signed)
Patient ID: Crystal Waters, female   DOB: 09-22-1955, 59 y.o.   MRN: [id number]   Subjective:    Patient ID: Crystal Waters, female    DOB: 02/03/1956, 59 y.o.   MRN: [id number]  Patient presents for CPE with PAP  here for complete physical exam. She is also due for Pap smear last was in 2011. Her colonoscopy is up-to-date her mammogram is up-to-date. She declines flu shot otherwise immunizations are up-to-date. She is menopausal.  She does not have any particular health concerns. She has been trying to eat a little better but has noticed that if she does not eat she feels like her blood glucoses dropping.  She does request a hepatitis C screen her sister was recently positive for hepatitis C.  Depression/insomnia. She is doing well with her Wellbutrin. 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 She often will just break this in half. When she tried the doxepin this makes her too groggy into the next day.  Review Of Systems:  GEN- denies fatigue, fever, weight loss,weakness, recent illness HEENT- denies eye drainage, change in vision, nasal discharge, CVS- denies chest pain, palpitations RESP- denies SOB, cough, wheeze ABD- denies N/V, change in stools, abd pain GU- denies dysuria, hematuria, dribbling, incontinence MSK- + joint pain, muscle aches, injury Neuro- denies headache, dizziness, syncope, seizure activity       Objective:    BP 136/72 mmHg  Pulse 74  Temp(Src) 98.7 F (37.1 C) (Oral)  Resp 14  Ht 5\' 7"  (1.702 m)  Wt 254 lb (115.214 kg)  BMI 39.77 kg/m2 GEN- NAD, alert and oriented x3,obese HEENT- PERRL, EOMI, non injected sclera, pink conjunctiva, MMM, oropharynx clear Neck- Supple, no thyromegaly CVS- RRR, no murmur RESP-CTAB Breast- normal symmetry, no nipple inversion,no nipple drainage, no nodules or lumps felt Nodes- no axillary nodes ABD-NABS,soft,NT,ND GU- normal external genitalia,  vaginal mucosa pink and moist, cervix visualized no growth, no blood form os, No discharge, no CMT, no ovarian masses, uterus normal size,rectum normal tone, FOBT negative Psych- normal affect and mood  EXT- No edema Pulses- Radial, DP- 2+        Assessment & Plan:      Problem List Items Addressed This Visit    None      Note: This dictation was prepared with Dragon dictation along with smaller phrase technology. Any transcriptional errors that result from this process are unintentional.

## 2015-02-07 NOTE — Assessment & Plan Note (Signed)
Check CBG, small meals throughout the day

## 2015-02-07 NOTE — Assessment & Plan Note (Signed)
Continue wellbutrin and xanax, advised 1 at bedtime

## 2015-02-07 NOTE — Patient Instructions (Signed)
I recommend eye visit once a year I recommend dental visit every 6 months Goal is to  Exercise 30 minutes 5 days a week We will send a letter with lab results  F/U 6 months  

## 2015-02-07 NOTE — Assessment & Plan Note (Signed)
Well controlled, no change to meds 

## 2015-02-08 LAB — HEMOGLOBIN A1C
Hgb A1c MFr Bld: 5.8 % — ABNORMAL HIGH (ref <5.7)
MEAN PLASMA GLUCOSE: 120 mg/dL — AB (ref <117)

## 2015-02-08 LAB — COMPREHENSIVE METABOLIC PANEL
ALBUMIN: 3.6 g/dL (ref 3.6–5.1)
ALT: 13 U/L (ref 6–29)
AST: 14 U/L (ref 10–35)
Alkaline Phosphatase: 56 U/L (ref 33–130)
BUN: 18 mg/dL (ref 7–25)
CALCIUM: 8.7 mg/dL (ref 8.6–10.4)
CO2: 28 mmol/L (ref 20–31)
Chloride: 103 mmol/L (ref 98–110)
Creat: 0.93 mg/dL (ref 0.50–1.05)
Glucose, Bld: 74 mg/dL (ref 70–99)
Potassium: 4.1 mmol/L (ref 3.5–5.3)
Sodium: 140 mmol/L (ref 135–146)
Total Bilirubin: 0.3 mg/dL (ref 0.2–1.2)
Total Protein: 6.3 g/dL (ref 6.1–8.1)

## 2015-02-08 LAB — PAP, THIN PREP W/HPV RFLX HPV TYPE 16/18: HPV DNA HIGH RISK: NOT DETECTED

## 2015-02-08 LAB — CBC WITH DIFFERENTIAL/PLATELET
BASOS ABS: 0 10*3/uL (ref 0.0–0.1)
Basophils Relative: 0 % (ref 0–1)
EOS ABS: 0.1 10*3/uL (ref 0.0–0.7)
EOS PCT: 2 % (ref 0–5)
HEMATOCRIT: 39.6 % (ref 36.0–46.0)
Hemoglobin: 13.2 g/dL (ref 12.0–15.0)
Lymphocytes Relative: 25 % (ref 12–46)
Lymphs Abs: 1.9 10*3/uL (ref 0.7–4.0)
MCH: 29.3 pg (ref 26.0–34.0)
MCHC: 33.3 g/dL (ref 30.0–36.0)
MCV: 87.8 fL (ref 78.0–100.0)
MPV: 10.8 fL (ref 8.6–12.4)
Monocytes Absolute: 0.6 10*3/uL (ref 0.1–1.0)
Monocytes Relative: 8 % (ref 3–12)
Neutro Abs: 4.8 10*3/uL (ref 1.7–7.7)
Neutrophils Relative %: 65 % (ref 43–77)
Platelets: 212 10*3/uL (ref 150–400)
RBC: 4.51 MIL/uL (ref 3.87–5.11)
RDW: 14.2 % (ref 11.5–15.5)
WBC: 7.4 10*3/uL (ref 4.0–10.5)

## 2015-02-08 LAB — HEPATITIS C ANTIBODY: HCV Ab: NEGATIVE

## 2015-04-05 ENCOUNTER — Ambulatory Visit: Payer: BLUE CROSS/BLUE SHIELD | Admitting: Family Medicine

## 2015-04-05 VITALS — BP 131/72 | HR 82

## 2015-04-05 DIAGNOSIS — I10 Essential (primary) hypertension: Secondary | ICD-10-CM

## 2015-04-05 NOTE — Progress Notes (Signed)
Pt came for BP check by nurse.  Brought her meter from home.  Was concerned because her BP was up when she saw eye doctor.  Michela Pitcher they has used a wrist monitor.  Recent readings off her machine were 134/80, 141/81, 146/79, 151/86, 145/93, 135/81 and 141/82.

## 2015-04-10 ENCOUNTER — Encounter: Payer: Self-pay | Admitting: Family Medicine

## 2015-04-11 ENCOUNTER — Ambulatory Visit (INDEPENDENT_AMBULATORY_CARE_PROVIDER_SITE_OTHER): Payer: BLUE CROSS/BLUE SHIELD | Admitting: Physician Assistant

## 2015-04-11 ENCOUNTER — Ambulatory Visit: Payer: Self-pay | Admitting: Family Medicine

## 2015-04-11 VITALS — BP 120/80 | HR 82 | Temp 98.4°F | Resp 18 | Wt 252.0 lb

## 2015-04-11 DIAGNOSIS — J029 Acute pharyngitis, unspecified: Secondary | ICD-10-CM | POA: Diagnosis not present

## 2015-04-11 DIAGNOSIS — R509 Fever, unspecified: Secondary | ICD-10-CM | POA: Diagnosis not present

## 2015-04-11 DIAGNOSIS — R5383 Other fatigue: Secondary | ICD-10-CM

## 2015-04-11 LAB — URINALYSIS, ROUTINE W REFLEX MICROSCOPIC
BILIRUBIN URINE: NEGATIVE
Glucose, UA: NEGATIVE
Ketones, ur: NEGATIVE
NITRITE: NEGATIVE
Specific Gravity, Urine: 1.025 (ref 1.001–1.035)
pH: 5.5 (ref 5.0–8.0)

## 2015-04-11 LAB — URINALYSIS, MICROSCOPIC ONLY
Casts: NONE SEEN [LPF]
Crystals: NONE SEEN [HPF]
Yeast: NONE SEEN [HPF]

## 2015-04-11 NOTE — Progress Notes (Signed)
Patient ID: Crystal Waters MRN: [id number], DOB: 1956-03-14, 60 y.o. Date of Encounter: @DATE @  Chief Complaint:  Chief Complaint  Patient presents with  . OTHER    been running low grade fever on and off since X friday the 13th  . Fatigue    HPI: 60 y.o. year old female  presents with above.   She says that usually she feels cold. Says that on Friday, January 13 she felt feverish. Says that weekend she felt feverish. Says that that Tuesday she took off work "because she felt wiped out". Says her temperature was 99.3 this morning and 99.1 last night. Says that usually her temperature runs low.  Says that back around the time of January 13 she had a little bit of congestion and a little bit of sore throat at night but that has resolved. Having no cough or chest congestion. Says that she has had no abdominal pain no nausea vomiting or diarrhea. Has had no dysuria frequency or urgency with urination.   Past Medical History  Diagnosis Date  . Allergy     gluten, seasonal  . Anxiety   . Asthma   . Cancer ([place])   . Thyroid disease   . Hypertension   . Depression   . GERD (gastroesophageal reflux disease)      Home Meds: Outpatient Prescriptions Prior to Visit  Medication Sig Dispense Refill  . ALPRAZolam (XANAX) 0.25 MG tablet Take 1 tablet (0.25 mg total) by mouth at bedtime as needed for sleep. 30 tablet 3  . buPROPion (WELLBUTRIN XL) 300 MG 24 hr tablet Take 1 tablet (300 mg total) by mouth daily. 90 tablet 3  . Cholecalciferol (VITAMIN D) 2000 UNITS tablet Take 2,000 Units by mouth daily.    Marland Kitchen doxepin (SINEQUAN) 25 MG capsule Take 1-2 tabs QHS prn insomnia 60 capsule 6  . lisinopril (PRINIVIL,ZESTRIL) 10 MG tablet Take 1 tablet (10 mg total) by mouth daily. 90 tablet 3  . loratadine (CLARITIN) 10 MG tablet Take 10 mg by mouth daily.    . naproxen (NAPROSYN) 500 MG tablet TAKE 1 TABLET BY MOUTH TWICE A DAY WITH A MEAL 60 tablet 5  . NP THYROID 60 MG tablet TAKE 2 AND  1/2 TABLET BY MOUTH DAILY BEFORE BREAKFAST 75 tablet 5  . ranitidine (ZANTAC) 150 MG tablet Take 1 tablet (150 mg total) by mouth 2 (two) times daily. 180 tablet 0   No facility-administered medications prior to visit.    Allergies:  Allergies  Allergen Reactions  . Zoloft [Sertraline Hcl]     Causes stomach aches    Social History   Social History  . Marital Status: Unknown    Spouse Name: N/A  . Number of Children: N/A  . Years of Education: N/A   Occupational History  . Not on file.   Social History Main Topics  . Smoking status: Former Research scientist (life sciences)  . Smokeless tobacco: Never Used     Comment: social smoker  . Alcohol Use: Yes     Comment: occasionally   . Drug Use: No  . Sexual Activity: Not Currently   Other Topics Concern  . Not on file   Social History Narrative    Family History  Problem Relation Age of Onset  . Diabetes Father   . Heart disease Father   . Hyperlipidemia Father   . Hypertension Father   . Diabetes Brother   . Autism Son   . Cancer Paternal Grandmother  Review of Systems:  See HPI for pertinent ROS. All other ROS negative.    Physical Exam: Blood pressure 120/80, pulse 82, temperature 98.4 F (36.9 C), temperature source Oral, resp. rate 18, weight 252 lb (114.306 kg)., Body mass index is 39.46 kg/(m^2). General: Obese WF. Appears in no acute distress. Head: Normocephalic, atraumatic, eyes without discharge, sclera non-icteric, nares are without discharge. Bilateral auditory canals clear, TM's are without perforation, pearly grey and translucent with reflective cone of light bilaterally. Oral cavity moist, posterior pharynx without exudate, erythema, peritonsillar abscess.  Neck: Supple. No thyromegaly. No lymphadenopathy. Lungs: Clear bilaterally to auscultation without wheezes, rales, or rhonchi. Breathing is unlabored. Heart: RRR with S1 S2. No murmurs, rubs, or gallops. Abdomen: Soft, non-tender, non-distended with normoactive  bowel sounds. No hepatomegaly. No rebound/guarding. No obvious abdominal masses. Musculoskeletal:  Strength and tone normal for age. Extremities/Skin: Warm and dry.  No rashes. Neuro: Alert and oriented X 3. Moves all extremities spontaneously. Gait is normal. CNII-XII grossly in tact. Psych:  Responds to questions appropriately with a normal affect.     ASSESSMENT AND PLAN:  60 y.o. year old female with  1. Fever, unspecified -Rapid Strep Test - CBC with Differential/Platelet - COMPLETE METABOLIC PANEL WITH GFR - TSH - Mononucleosis screen - Urine culture - Urinalysis, Routine w reflex microscopic (not at [facility]) - Culture, blood (single) w Reflex to ID Panel  2. Acute pharyngitis, unspecified etiology -Rapid Strep Test - Mononucleosis screen  3. Other fatigue -Rapid Strep Test - CBC with Differential/Platelet - COMPLETE METABOLIC PANEL WITH GFR - TSH - Mononucleosis screen - Urine culture - Urinalysis, Routine w reflex microscopic (not at [facility]) - Culture, blood (single) w Reflex to ID Panel  Will check above labs. Will Follow-up with patient once I get lab results. Told her to follow-up with [place] sooner if fever increases in the interim.  [address] [place], [place], [facility] 04/11/2015 4:55 PM

## 2015-04-12 LAB — COMPLETE METABOLIC PANEL WITH GFR
ALT: 14 U/L (ref 6–29)
AST: 15 U/L (ref 10–35)
Albumin: 4 g/dL (ref 3.6–5.1)
Alkaline Phosphatase: 59 U/L (ref 33–130)
BUN: 24 mg/dL (ref 7–25)
CHLORIDE: 104 mmol/L (ref 98–110)
CO2: 27 mmol/L (ref 20–31)
Calcium: 9.1 mg/dL (ref 8.6–10.4)
Creat: 1.03 mg/dL (ref 0.50–1.05)
GFR, Est African American: 69 mL/min (ref >=60)
GFR, Est Non African American: 60 mL/min (ref >=60)
Glucose, Bld: 98 mg/dL (ref 70–99)
Potassium: 4.1 mmol/L (ref 3.5–5.3)
Sodium: 140 mmol/L (ref 135–146)
Total Bilirubin: 0.3 mg/dL (ref 0.2–1.2)
Total Protein: 6.7 g/dL (ref 6.1–8.1)

## 2015-04-12 LAB — CBC WITH DIFFERENTIAL/PLATELET
BASOS PCT: 0 % (ref 0–1)
Basophils Absolute: 0 10*3/uL (ref 0.0–0.1)
EOS PCT: 1 % (ref 0–5)
Eosinophils Absolute: 0.1 10*3/uL (ref 0.0–0.7)
HCT: 42.3 % (ref 36.0–46.0)
HEMOGLOBIN: 13.5 g/dL (ref 12.0–15.0)
Lymphocytes Relative: 25 % (ref 12–46)
Lymphs Abs: 1.9 10*3/uL (ref 0.7–4.0)
MCH: 28.4 pg (ref 26.0–34.0)
MCHC: 31.9 g/dL (ref 30.0–36.0)
MCV: 88.9 fL (ref 78.0–100.0)
MONO ABS: 0.5 10*3/uL (ref 0.1–1.0)
MPV: 11.4 fL (ref 8.6–12.4)
Monocytes Relative: 7 % (ref 3–12)
NEUTROS ABS: 5 10*3/uL (ref 1.7–7.7)
Neutrophils Relative %: 67 % (ref 43–77)
PLATELETS: 218 10*3/uL (ref 150–400)
RBC: 4.76 MIL/uL (ref 3.87–5.11)
RDW: 13.5 % (ref 11.5–15.5)
WBC: 7.4 10*3/uL (ref 4.0–10.5)

## 2015-04-12 LAB — TSH: TSH: 1.088 u[IU]/mL (ref 0.350–4.500)

## 2015-04-12 LAB — MONONUCLEOSIS SCREEN: Heterophile, Mono Screen: NEGATIVE

## 2015-04-13 LAB — URINE CULTURE

## 2015-04-14 ENCOUNTER — Other Ambulatory Visit: Payer: Self-pay | Admitting: Family Medicine

## 2015-04-14 DIAGNOSIS — Z87448 Personal history of other diseases of urinary system: Secondary | ICD-10-CM

## 2015-04-18 LAB — CULTURE, BLOOD (SINGLE): Organism ID, Bacteria: NO GROWTH

## 2015-04-25 ENCOUNTER — Other Ambulatory Visit: Payer: BLUE CROSS/BLUE SHIELD

## 2015-04-25 DIAGNOSIS — Z87448 Personal history of other diseases of urinary system: Secondary | ICD-10-CM

## 2015-04-25 LAB — URINALYSIS, ROUTINE W REFLEX MICROSCOPIC
BILIRUBIN URINE: NEGATIVE
GLUCOSE, UA: NEGATIVE
Ketones, ur: NEGATIVE
Nitrite: NEGATIVE
PH: 6.5 (ref 5.0–8.0)
Protein, ur: NEGATIVE
Specific Gravity, Urine: 1.015 (ref 1.001–1.035)

## 2015-04-25 LAB — URINALYSIS, MICROSCOPIC ONLY
CRYSTALS: NONE SEEN [HPF]
Casts: NONE SEEN [LPF]
YEAST: NONE SEEN [HPF]

## 2015-04-28 ENCOUNTER — Other Ambulatory Visit: Payer: Self-pay | Admitting: Family Medicine

## 2015-04-28 DIAGNOSIS — R319 Hematuria, unspecified: Secondary | ICD-10-CM

## 2015-05-16 ENCOUNTER — Encounter: Payer: Self-pay | Admitting: Family Medicine

## 2015-05-17 MED ORDER — THYROID 97.5 MG PO TABS
ORAL_TABLET | ORAL | Status: DC
Start: 2015-05-17 — End: 2015-07-11

## 2015-05-18 ENCOUNTER — Other Ambulatory Visit: Payer: BLUE CROSS/BLUE SHIELD

## 2015-05-18 ENCOUNTER — Other Ambulatory Visit: Payer: Self-pay | Admitting: *Deleted

## 2015-05-18 DIAGNOSIS — R319 Hematuria, unspecified: Secondary | ICD-10-CM

## 2015-05-18 LAB — URINALYSIS, MICROSCOPIC ONLY: Yeast: NONE SEEN [HPF]

## 2015-05-18 LAB — URINALYSIS, ROUTINE W REFLEX MICROSCOPIC
BILIRUBIN URINE: NEGATIVE
Glucose, UA: NEGATIVE
Ketones, ur: NEGATIVE
Nitrite: NEGATIVE
SPECIFIC GRAVITY, URINE: 1.02 (ref 1.001–1.035)
pH: 5.5 (ref 5.0–8.0)

## 2015-05-21 LAB — URINE CULTURE: Colony Count: 15000

## 2015-05-31 ENCOUNTER — Encounter: Payer: Self-pay | Admitting: Family Medicine

## 2015-05-31 ENCOUNTER — Ambulatory Visit (INDEPENDENT_AMBULATORY_CARE_PROVIDER_SITE_OTHER): Payer: BLUE CROSS/BLUE SHIELD | Admitting: Family Medicine

## 2015-05-31 VITALS — BP 122/78 | HR 72 | Temp 98.4°F | Resp 14 | Ht 67.0 in | Wt 249.0 lb

## 2015-05-31 DIAGNOSIS — R6889 Other general symptoms and signs: Secondary | ICD-10-CM

## 2015-05-31 DIAGNOSIS — B349 Viral infection, unspecified: Secondary | ICD-10-CM

## 2015-05-31 MED ORDER — GUAIFENESIN-CODEINE 100-10 MG/5ML PO SOLN: 5.0000 mL | Freq: Four times a day (QID) | ORAL | Status: DC | PRN

## 2015-05-31 NOTE — Patient Instructions (Signed)
Push fluids Rest Prescription sent  Give work note covering 3/13-3/15, can return on 06/02/15 Viral Infections A virus is a type of germ. Viruses can cause:  Minor sore throats.  Aches and pains.  Headaches.  Runny nose.  Rashes.  Watery eyes.  Tiredness.  Coughs.  Loss of appetite.  Feeling sick to your stomach (nausea).  Throwing up (vomiting).  Watery poop (diarrhea). HOME CARE   Only take medicines as told by your doctor.  Drink enough water and fluids to keep your pee (urine) clear or pale yellow. Sports drinks are a good choice.  Get plenty of rest and eat healthy. Soups and broths with crackers or rice are fine. GET HELP RIGHT AWAY IF:   You have a very bad headache.  You have shortness of breath.  You have chest pain or neck pain.  You have an unusual rash.  You cannot stop throwing up.  You have watery poop that does not stop.  You cannot keep fluids down.  You or your child has a temperature by mouth above 102 F (38.9 C), not controlled by medicine.  Your baby is older than 3 months with a rectal temperature of 102 F (38.9 C) or higher.  Your baby is 84 months old or younger with a rectal temperature of 100.4 F (38 C) or higher. MAKE SURE YOU:   Understand these instructions.  Will watch this condition.  Will get help right away if you are not doing well or get worse.   This information is not intended to replace advice given to you by your health care provider. Make sure you discuss any questions you have with your health care provider.   Document Released: 02/16/2008 Document Revised: 05/28/2011 Document Reviewed: 08/11/2014 Elsevier Interactive Patient Education [employer].

## 2015-05-31 NOTE — Progress Notes (Signed)
Patient ID: Crystal Waters, female   DOB: 06/03/1955, 60 y.o.   MRN: [id number]   Subjective:    Patient ID: Crystal Waters, female    DOB: 27-Oct-1955, 60 y.o.   MRN: [id number]  Patient presents for Illness Patient here with illness for the past 4 days. On Thursday she began having cough with mild production this worsened on Friday. Positive sick contact with some coworkers.  The weekend she then developed nausea vomiting and some diarrhea rehabilitation which resolved by Sunday. She's also had some mild muscle aches. She is now left with residual cough with mild production. She does use spell some which helps some. She did have some fever last night 100.82F was the maximum. She has been unable to tolerate food now.    Review Of Systems:  GEN- + fatigue,+ fever, weight loss,weakness, recent illness HEENT- denies eye drainage, change in vision,+ nasal discharge, CVS- denies chest pain, palpitations RESP- denies SOB,+ cough, wheeze ABD- denies N/V, change in stools, abd pain GU- denies dysuria, hematuria, dribbling, incontinence MSK- denies joint pain, muscle aches, injury Neuro- denies headache, dizziness, syncope, seizure activity       Objective:    BP 122/78 mmHg  Pulse 72  Temp(Src) 98.4 F (36.9 C) (Oral)  Resp 14  Ht 5\' 7"  (1.702 m)  Wt 249 lb (112.946 kg)  BMI 38.99 kg/m2  SpO2 98% GEN- NAD, alert and oriented x3,fatigued appearing  HEENT- PERRL, EOMI, non injected sclera, pink conjunctiva, MMM, oropharynx clear TM clear bilat no effusion, no  maxillary sinus tenderness, clear  Nasal drainage  Neck- Supple, no LAD CVS- RRR, no murmur RESP-CTAB EXT- No edema Pulses- Radial 2+         Assessment & Plan:      Problem List Items Addressed This Visit    None    Visit Diagnoses    Viral illness    -  Primary    Based on history flu like illness. Now 4 days in, so Tamiflu not helpful. Continue fever reducer, change to robitussin with codiene, advised to stay out  of work today and tomorrow. Fluids.     Flu-like symptoms           Note: This dictation was prepared with Dragon dictation along with smaller phrase technology. Any transcriptional errors that result from this process are unintentional.

## 2015-06-02 ENCOUNTER — Encounter: Payer: Self-pay | Admitting: Family Medicine

## 2015-06-02 NOTE — Telephone Encounter (Signed)
Pt called back and new work note given per providers approval.

## 2015-06-25 ENCOUNTER — Other Ambulatory Visit: Payer: Self-pay | Admitting: Family Medicine

## 2015-06-27 NOTE — Telephone Encounter (Signed)
Refill appropriate and filled per protocol. 

## 2015-07-05 ENCOUNTER — Encounter: Payer: Self-pay | Admitting: Family Medicine

## 2015-07-06 ENCOUNTER — Encounter: Payer: Self-pay | Admitting: Family Medicine

## 2015-07-06 ENCOUNTER — Ambulatory Visit (INDEPENDENT_AMBULATORY_CARE_PROVIDER_SITE_OTHER): Payer: BLUE CROSS/BLUE SHIELD | Admitting: Family Medicine

## 2015-07-06 VITALS — BP 128/78 | HR 72 | Temp 98.4°F | Resp 16 | Ht 67.0 in | Wt 251.0 lb

## 2015-07-06 DIAGNOSIS — M25512 Pain in left shoulder: Secondary | ICD-10-CM

## 2015-07-06 DIAGNOSIS — F3342 Major depressive disorder, recurrent, in full remission: Secondary | ICD-10-CM | POA: Diagnosis not present

## 2015-07-06 DIAGNOSIS — F411 Generalized anxiety disorder: Secondary | ICD-10-CM

## 2015-07-06 DIAGNOSIS — I10 Essential (primary) hypertension: Secondary | ICD-10-CM | POA: Diagnosis not present

## 2015-07-06 LAB — CBC WITH DIFFERENTIAL/PLATELET
BASOS PCT: 0 %
Basophils Absolute: 0 cells/uL (ref 0–200)
EOS PCT: 2 %
Eosinophils Absolute: 116 cells/uL (ref 15–500)
HCT: 40.7 % (ref 35.0–45.0)
Hemoglobin: 13.4 g/dL (ref 12.0–15.0)
LYMPHS PCT: 25 %
Lymphs Abs: 1450 cells/uL (ref 850–3900)
MCH: 29.6 pg (ref 27.0–33.0)
MCHC: 32.9 g/dL (ref 32.0–36.0)
MCV: 90 fL (ref 80.0–100.0)
MPV: 10.5 fL (ref 7.5–12.5)
Monocytes Absolute: 406 cells/uL (ref 200–950)
Monocytes Relative: 7 %
NEUTROS PCT: 66 %
Neutro Abs: 3828 cells/uL (ref 1500–7800)
Platelets: 189 10*3/uL (ref 140–400)
RBC: 4.52 MIL/uL (ref 3.80–5.10)
RDW: 14 % (ref 11.0–15.0)
WBC: 5.8 10*3/uL (ref 3.8–10.8)

## 2015-07-06 LAB — COMPREHENSIVE METABOLIC PANEL
ALT: 17 U/L (ref 6–29)
AST: 17 U/L (ref 10–35)
Albumin: 3.7 g/dL (ref 3.6–5.1)
Alkaline Phosphatase: 58 U/L (ref 33–130)
BILIRUBIN TOTAL: 0.4 mg/dL (ref 0.2–1.2)
BUN: 20 mg/dL (ref 7–25)
CO2: 27 mmol/L (ref 20–31)
CREATININE: 1.08 mg/dL — AB (ref 0.50–1.05)
Calcium: 9.3 mg/dL (ref 8.6–10.4)
Chloride: 105 mmol/L (ref 98–110)
GLUCOSE: 97 mg/dL (ref 70–99)
Potassium: 5 mmol/L (ref 3.5–5.3)
SODIUM: 142 mmol/L (ref 135–146)
Total Protein: 6.6 g/dL (ref 6.1–8.1)

## 2015-07-06 LAB — LIPID PANEL
Cholesterol: 213 mg/dL — ABNORMAL HIGH (ref 125–200)
HDL: 76 mg/dL (ref >=46)
LDL Cholesterol: 115 mg/dL (ref <130)
Total CHOL/HDL Ratio: 2.8 Ratio (ref <=5.0)
Triglycerides: 108 mg/dL (ref <150)
VLDL: 22 mg/dL (ref <30)

## 2015-07-06 MED ORDER — TRIAMCINOLONE ACETONIDE 0.1 % EX CREA: 1.0000 "application " | TOPICAL_CREAM | Freq: Two times a day (BID) | CUTANEOUS | Status: DC

## 2015-07-06 NOTE — Patient Instructions (Addendum)
Take the naprosyn twice a day  Look into HR for leave of absence We will call with lab results  F/U as previous
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
## 2015-07-07 ENCOUNTER — Encounter: Payer: Self-pay | Admitting: Family Medicine

## 2015-07-07 NOTE — Assessment & Plan Note (Signed)
EKG reassuring, chest pain atypical, more MSK from shoulder pain, likley overuse But also with a lot of anxiety and stress from work. I think she would beneift from a leave of abscence for overall mental health reasons. She is describing a toxic work environment and she seems to have little support. I recommended that she go speak with her HR department or someone higher up to voice her concerns. She will also look into LOA paperwork

## 2015-07-07 NOTE — Assessment & Plan Note (Signed)
Continue wellbutrin and Xanax

## 2015-07-11 ENCOUNTER — Encounter: Payer: Self-pay | Admitting: Family Medicine

## 2015-07-11 ENCOUNTER — Other Ambulatory Visit: Payer: Self-pay | Admitting: *Deleted

## 2015-07-11 MED ORDER — THYROID 97.5 MG PO TABS: ORAL_TABLET | ORAL | Status: DC

## 2015-07-11 NOTE — Telephone Encounter (Signed)
Received fax requesting refill on nature thyroid.   Refill appropriate and filled per protocol.

## 2015-07-25 ENCOUNTER — Telehealth: Payer: Self-pay | Admitting: *Deleted

## 2015-07-25 NOTE — Telephone Encounter (Signed)
Received fax from [facility] for chart notes in regards to April 2017 for disability claim.   Chart notes printed and routed to provider.

## 2015-07-27 NOTE — Telephone Encounter (Signed)
Received completed notes from provider.   No charge per provider.   Faxed to [facility].

## 2015-08-01 ENCOUNTER — Encounter: Payer: Self-pay | Admitting: Family Medicine

## 2015-08-03 ENCOUNTER — Ambulatory Visit: Payer: Self-pay | Admitting: Family Medicine

## 2015-08-10 ENCOUNTER — Ambulatory Visit (INDEPENDENT_AMBULATORY_CARE_PROVIDER_SITE_OTHER): Payer: BLUE CROSS/BLUE SHIELD | Admitting: Family Medicine

## 2015-08-10 ENCOUNTER — Encounter: Payer: Self-pay | Admitting: Family Medicine

## 2015-08-10 VITALS — BP 128/74 | HR 62 | Temp 98.7°F | Resp 16 | Ht 67.0 in | Wt 252.0 lb

## 2015-08-10 DIAGNOSIS — F3342 Major depressive disorder, recurrent, in full remission: Secondary | ICD-10-CM | POA: Diagnosis not present

## 2015-08-10 DIAGNOSIS — G4700 Insomnia, unspecified: Secondary | ICD-10-CM

## 2015-08-10 DIAGNOSIS — M170 Bilateral primary osteoarthritis of knee: Secondary | ICD-10-CM

## 2015-08-10 DIAGNOSIS — S46912D Strain of unspecified muscle, fascia and tendon at shoulder and upper arm level, left arm, subsequent encounter: Secondary | ICD-10-CM

## 2015-08-10 DIAGNOSIS — F5104 Psychophysiologic insomnia: Secondary | ICD-10-CM

## 2015-08-10 DIAGNOSIS — F411 Generalized anxiety disorder: Secondary | ICD-10-CM | POA: Diagnosis not present

## 2015-08-10 NOTE — Assessment & Plan Note (Signed)
She will continue follow-up with Dr. Ricki Rodriguez she does need her knee injections which will improve her overall mobility and function this will be done the first week of June

## 2015-08-10 NOTE — Progress Notes (Signed)
Patient ID: Crystal Waters, female   DOB: 07/22/1955, 61 y.o.   MRN: [id number]    Subjective:    Patient ID: Crystal Waters, female    DOB: 06/03/1955, 60 y.o.   MRN: [id number]  Patient presents for Anxiety  Patient here for interim follow-up she's currently on medical leave due to increased stress and anxiety at work she also was having some shoulder difficulties more of a tendinitis which she's had treatment with her chiropractor and she also has Naprosyn. She's been on leave for the past 4 weeks and will like to extend another week as she has a couple of follow-up appointments with Chiropracter as well as her orthopedics to have Synvisc injections done for her bilateral osteoarthritis. She did speak with her human resources regarding her stressors and difficulties at work she is healthy old that something will come out of that. She is still taking her Wellbutrin and her xanax as needed. She does continue to have difficulty with sleep   Review Of Systems:  GEN- denies fatigue, fever, weight loss,weakness, recent illness HEENT- denies eye drainage, change in vision, nasal discharge, CVS- denies chest pain, palpitations RESP- denies SOB, cough, wheeze ABD- denies N/V, change in stools, abd pain GU- denies dysuria, hematuria, dribbling, incontinence MSK- + joint pain, muscle aches, injury Neuro- denies headache, dizziness, syncope, seizure activity       Objective:    BP 128/74 mmHg  Pulse 62  Temp(Src) 98.7 F (37.1 C) (Oral)  Resp 16  Ht 5\' 7"  (1.702 m)  Wt 252 lb (114.306 kg)  BMI 39.46 kg/m2 GEN- NAD, alert and oriented x3 Psych- Normal afffect and mood, more relaxed today , normal thought process.  MSK- Good ROM upper ext, shoulders, strength in tact        Assessment & Plan:    Approx 20 minutes spent with pt > 50% on counseling, medication management   Problem List Items Addressed This Visit    OA (osteoarthritis) of knee    She will continue follow-up with Dr.  Ricki Rodriguez she does need her knee injections which will improve her overall mobility and function this will be done the first week of June      Major depressive disorder ([place]) - Primary    We'll continue the Wellbutrin and alprazolam at the current dose. Hopefully she will gets improvement in her toxic work environment by discussing her concerns with human resources and how she has been treated. I think she can safely return to work on June 12      GAD (generalized anxiety disorder)   Chronic insomnia    Discussed turn off the TV radio and electronic devices when she does wake up in the middle the night as this hinders her from trying to return to sleep       Other Visit Diagnoses    Shoulder strain, left, subsequent encounter        complete therapy per chiropracter, much improved. I think this was result of repetitive movements at work       Note: This dictation was prepared with Diplomatic Services operational officer dictation along with smaller Company secretary. Any transcriptional errors that result from this process are unintentional.

## 2015-08-10 NOTE — Patient Instructions (Signed)
Release of records- [facility] to current medications F/U 3 months

## 2015-08-10 NOTE — Assessment & Plan Note (Signed)
We'll continue the Wellbutrin and alprazolam at the current dose. Hopefully she will gets improvement in her toxic work environment by discussing her concerns with human resources and how she has been treated. I think she can safely return to work on June 12

## 2015-08-10 NOTE — Assessment & Plan Note (Signed)
Discussed turn off the TV radio and electronic devices when she does wake up in the middle the night as this hinders her from trying to return to sleep

## 2015-09-05 ENCOUNTER — Encounter: Payer: Self-pay | Admitting: Family Medicine

## 2015-09-12 ENCOUNTER — Encounter: Payer: Self-pay | Admitting: Family Medicine

## 2015-09-12 ENCOUNTER — Ambulatory Visit (INDEPENDENT_AMBULATORY_CARE_PROVIDER_SITE_OTHER): Payer: BLUE CROSS/BLUE SHIELD | Admitting: Family Medicine

## 2015-09-12 VITALS — BP 142/82 | HR 82 | Temp 98.7°F | Resp 14 | Ht 67.0 in | Wt 252.0 lb

## 2015-09-12 DIAGNOSIS — F411 Generalized anxiety disorder: Secondary | ICD-10-CM | POA: Diagnosis not present

## 2015-09-12 DIAGNOSIS — G4700 Insomnia, unspecified: Secondary | ICD-10-CM

## 2015-09-12 DIAGNOSIS — F3342 Major depressive disorder, recurrent, in full remission: Secondary | ICD-10-CM

## 2015-09-12 DIAGNOSIS — F5104 Psychophysiologic insomnia: Secondary | ICD-10-CM

## 2015-09-12 DIAGNOSIS — Z566 Other physical and mental strain related to work: Secondary | ICD-10-CM | POA: Diagnosis not present

## 2015-09-12 NOTE — Patient Instructions (Signed)
Call [employer] for your work Continue meds Start leave on 7/3 - 8/7 F/U 3 weeks

## 2015-09-12 NOTE — Assessment & Plan Note (Signed)
Major depression along with anxiety which is worsening with her workplace situation. She is not functioning very well known getting other somatic issues from her stress. I think she would benefit from therapy counseling to just handle the situations at work going further.  Have her set up with psychologist through her company. I will continue the Wellbutrin and alprazolam for right now. She will follow-up in 3 weeks. For now will take her out of work for the next month she is also getting contact these Paramedic that she needs to speak with.
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
## 2015-09-21 ENCOUNTER — Telehealth: Payer: Self-pay | Admitting: *Deleted

## 2015-09-21 NOTE — Telephone Encounter (Signed)
Received fax from [facility] for FMLA forms.   Call placed to patient for more information.   Reason FMLA requested: stressful work environment causing somatic sx- last [place] 6/26 in regards to workplace stress  Requested Beginning Date: 09/19/2015 Return to Work Date: 10/24/2015.  Verbalized that fee may be charged and is per provider prerogative.   Forms routed to provider.

## 2015-09-23 ENCOUNTER — Other Ambulatory Visit: Payer: Self-pay | Admitting: Family Medicine

## 2015-09-23 NOTE — Telephone Encounter (Signed)
Refill appropriate and filled per protocol. 

## 2015-09-28 ENCOUNTER — Encounter: Payer: Self-pay | Admitting: Family Medicine

## 2015-09-28 NOTE — Telephone Encounter (Signed)
noted 

## 2015-09-28 NOTE — Telephone Encounter (Signed)
Received completed FMLA from provider.   No charge per provider.   Faxed to [facility].

## 2015-09-28 NOTE — Telephone Encounter (Signed)
Received forms from provider.   Provider states that forms include pages 1-2 of 14. Inquired about pages 3-14.  Call placed to [facility]. Was advised that entire packet was mailed to patient (pages 1-14). Advised that pages 1-2 are for MD to complete, and pages 3-14 are patient information.   MD to be made aware.

## 2015-10-03 ENCOUNTER — Encounter: Payer: Self-pay | Admitting: Family Medicine

## 2015-10-03 ENCOUNTER — Ambulatory Visit (INDEPENDENT_AMBULATORY_CARE_PROVIDER_SITE_OTHER): Payer: BLUE CROSS/BLUE SHIELD | Admitting: Family Medicine

## 2015-10-03 VITALS — BP 132/72 | HR 78 | Temp 97.8°F | Resp 12 | Ht 67.0 in | Wt 257.0 lb

## 2015-10-03 DIAGNOSIS — M25512 Pain in left shoulder: Secondary | ICD-10-CM | POA: Diagnosis not present

## 2015-10-03 DIAGNOSIS — F3342 Major depressive disorder, recurrent, in full remission: Secondary | ICD-10-CM

## 2015-10-03 DIAGNOSIS — G4700 Insomnia, unspecified: Secondary | ICD-10-CM | POA: Diagnosis not present

## 2015-10-03 DIAGNOSIS — F5104 Psychophysiologic insomnia: Secondary | ICD-10-CM

## 2015-10-03 NOTE — Assessment & Plan Note (Signed)
continue medications, I think she is benefiting from leave and working with therapist and groups She is learning how to handle her situation at work  Expect to return to work as scheduled on 10/24/15

## 2015-10-03 NOTE — Patient Instructions (Addendum)
F/U as previous on Sept  Continue current medications
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
## 2015-10-03 NOTE — Assessment & Plan Note (Signed)
Left shoulder pain, ? Nerve impingment ROM is normal, doubt significant tear She declines further evaluation due to cost at this time

## 2015-10-11 ENCOUNTER — Telehealth: Payer: Self-pay | Admitting: *Deleted

## 2015-10-11 NOTE — Telephone Encounter (Signed)
Received fax from [facility] for FMLA forms.   Call placed to patient for more information.   Of note, forms are blank copies of the ones completed on 09/27/2015.  Reason FMLA requested: stressful work environment causing somatic sx- last [place] 7/17 in regards to workplace stress  Requested Beginning Date: 09/19/2015 Return to Work Date: 10/24/2015  Verbalized that fee may be charged and is per provider prerogative.   Forms routed to provider.

## 2015-10-12 NOTE — Telephone Encounter (Signed)
Received completed FMLA forms from provider.   No charge per provider.   Faxed to [employer] [facility] [facility] .

## 2015-10-14 ENCOUNTER — Other Ambulatory Visit: Payer: Self-pay | Admitting: Family Medicine

## 2015-10-14 NOTE — Telephone Encounter (Signed)
Medication called to pharmacy. 

## 2015-10-14 NOTE — Telephone Encounter (Signed)
Okay to refill? 

## 2015-10-14 NOTE — Telephone Encounter (Signed)
Ok to refill??  Last office visit 10/03/2015.  Last refill 02/02/2015, #3 refills.

## 2015-10-19 ENCOUNTER — Encounter: Payer: Self-pay | Admitting: Family Medicine

## 2015-10-21 ENCOUNTER — Encounter: Payer: Self-pay | Admitting: *Deleted

## 2015-11-01 ENCOUNTER — Encounter: Payer: Self-pay | Admitting: Family Medicine

## 2015-11-22 ENCOUNTER — Encounter: Payer: Self-pay | Admitting: Family Medicine

## 2015-11-22 ENCOUNTER — Ambulatory Visit (INDEPENDENT_AMBULATORY_CARE_PROVIDER_SITE_OTHER): Payer: BLUE CROSS/BLUE SHIELD | Admitting: Family Medicine

## 2015-11-22 VITALS — BP 128/64 | HR 72 | Temp 98.5°F | Resp 14 | Ht 67.0 in | Wt 251.0 lb

## 2015-11-22 DIAGNOSIS — F3342 Major depressive disorder, recurrent, in full remission: Secondary | ICD-10-CM | POA: Diagnosis not present

## 2015-11-22 DIAGNOSIS — E038 Other specified hypothyroidism: Secondary | ICD-10-CM

## 2015-11-22 DIAGNOSIS — E669 Obesity, unspecified: Secondary | ICD-10-CM | POA: Diagnosis not present

## 2015-11-22 DIAGNOSIS — I10 Essential (primary) hypertension: Secondary | ICD-10-CM

## 2015-11-22 DIAGNOSIS — R7302 Impaired glucose tolerance (oral): Secondary | ICD-10-CM

## 2015-11-22 DIAGNOSIS — Z23 Encounter for immunization: Secondary | ICD-10-CM

## 2015-11-22 LAB — COMPREHENSIVE METABOLIC PANEL
ALBUMIN: 3.9 g/dL (ref 3.6–5.1)
ALT: 13 U/L (ref 6–29)
AST: 13 U/L (ref 10–35)
Alkaline Phosphatase: 54 U/L (ref 33–130)
BUN: 15 mg/dL (ref 7–25)
CALCIUM: 9 mg/dL (ref 8.6–10.4)
CO2: 24 mmol/L (ref 20–31)
Chloride: 107 mmol/L (ref 98–110)
Creat: 1.18 mg/dL — ABNORMAL HIGH (ref 0.50–1.05)
GLUCOSE: 117 mg/dL — AB (ref 70–99)
Potassium: 4.3 mmol/L (ref 3.5–5.3)
Sodium: 141 mmol/L (ref 135–146)
Total Bilirubin: 0.4 mg/dL (ref 0.2–1.2)
Total Protein: 6.6 g/dL (ref 6.1–8.1)

## 2015-11-22 LAB — LIPID PANEL
CHOL/HDL RATIO: 3.1 ratio (ref <=5.0)
Cholesterol: 218 mg/dL — ABNORMAL HIGH (ref 125–200)
HDL: 71 mg/dL (ref >=46)
LDL CALC: 119 mg/dL (ref <130)
TRIGLYCERIDES: 141 mg/dL (ref <150)
VLDL: 28 mg/dL (ref <30)

## 2015-11-22 LAB — T4, FREE: Free T4: 0.7 ng/dL — ABNORMAL LOW (ref 0.8–1.8)

## 2015-11-22 LAB — CBC WITH DIFFERENTIAL/PLATELET
Basophils Absolute: 0 cells/uL (ref 0–200)
Basophils Relative: 0 %
Eosinophils Absolute: 112 cells/uL (ref 15–500)
Eosinophils Relative: 2 %
HEMATOCRIT: 41.2 % (ref 35.0–45.0)
HEMOGLOBIN: 13.3 g/dL (ref 12.0–15.0)
LYMPHS ABS: 1400 {cells}/uL (ref 850–3900)
Lymphocytes Relative: 25 %
MCH: 29.4 pg (ref 27.0–33.0)
MCHC: 32.3 g/dL (ref 32.0–36.0)
MCV: 90.9 fL (ref 80.0–100.0)
MONO ABS: 448 {cells}/uL (ref 200–950)
MPV: 11.7 fL (ref 7.5–12.5)
Monocytes Relative: 8 %
Neutro Abs: 3640 cells/uL (ref 1500–7800)
Neutrophils Relative %: 65 %
Platelets: 198 10*3/uL (ref 140–400)
RBC: 4.53 MIL/uL (ref 3.80–5.10)
RDW: 13.6 % (ref 11.0–15.0)
WBC: 5.6 10*3/uL (ref 3.8–10.8)

## 2015-11-22 LAB — TSH: TSH: 9.05 mIU/L — ABNORMAL HIGH

## 2015-11-22 LAB — T3, FREE: T3, Free: 2.3 pg/mL (ref 2.3–4.2)

## 2015-11-22 NOTE — Patient Instructions (Signed)
We will call with lab results  F/U 3 months for Physical

## 2015-11-22 NOTE — Assessment & Plan Note (Signed)
Overall mood is much improved today. She will continue her Wellbutrin and her Xanax she will continue  with her therapist as needed

## 2015-11-22 NOTE — Progress Notes (Signed)
   Subjective:    Patient ID: Crystal Waters, female    DOB: October 27, 1955, 60 y.o.   MRN: [id number]  Patient presents for Follow-up (3 month- is fasting) Crystal Waters here to follow-up chronic medical problems she is also due for fasting labs. She's currently being treated for the thyroidism her symptoms have been stable no changes in her dose. Her cholesterol increased back in April she's working on dietary changes.  She's also on treatment for hypertension blood pressure has been stable with lisinopril  She is Now sees her therapist as needed. She's still taking her Wellbutrin her Xanax , she is looking for another job   History of glucose intolerance in setting of her obesity last A1c 5.7% a year ago. Weight is down 6lbs since last visit July , and try to follow ketogenic diet has had some cheek basophils like her weight is tall in. She would like to have her thyroid checked  No new concerns today   Review Of Systems:  GEN- denies fatigue, fever, weight loss,weakness, recent illness HEENT- denies eye drainage, change in vision, nasal discharge, CVS- denies chest pain, palpitations RESP- denies SOB, cough, wheeze ABD- denies N/V, change in stools, abd pain GU- denies dysuria, hematuria, dribbling, incontinence MSK- denies joint pain, muscle aches, injury Neuro- denies headache, dizziness, syncope, seizure activity       Objective:    BP 128/64 (BP Location: Left Arm, Patient Position: Sitting, Cuff Size: Large)   Pulse 72   Temp 98.5 F (36.9 C) (Oral)   Resp 14   Ht 5\' 7"  (1.702 m)   Wt 251 lb (113.9 kg)   BMI 39.31 kg/m  GEN- NAD, alert and oriented x3 HEENT- PERRL, EOMI, non injected sclera, pink conjunctiva, MMM, oropharynx clear Neck- Supple, CVS- RRR, no murmur RESP-CTAB Psych- normal affect and mood  EXT- No edema Pulses- Radial, DP- 2+        Assessment & Plan:      Problem List Items Addressed This Visit    Obesity    She does have some mild glucose  intolerance advised to continue with low-carb high-fat diet Watch her carbs a little bit closer.      Major depressive disorder (HCC)    Overall mood is much improved today. She will continue her Wellbutrin and her Xanax she will continue  with her therapist as needed      Hypothyroidism    We will check her thyroid levels today      Relevant Orders   TSH   T3, free   T4, free   Glucose intolerance (impaired glucose tolerance) - Primary   Relevant Orders   Hemoglobin A1c   Essential hypertension, benign    Blood pressure much improved      Relevant Orders   CBC with Differential/Platelet   Comprehensive metabolic panel   Lipid panel    Other Visit Diagnoses    Need for prophylactic vaccination and inoculation against influenza       Relevant Orders   Flu Vaccine QUAD 36+ mos PF IM (Fluarix & Fluzone Quad PF) (Completed)      Note: This dictation was prepared with Dragon dictation along with smaller phrase technology. Any transcriptional errors that result from this process are unintentional.

## 2015-11-22 NOTE — Assessment & Plan Note (Signed)
We will check her thyroid levels today

## 2015-11-22 NOTE — Assessment & Plan Note (Signed)
Blood pressure much improved 

## 2015-11-22 NOTE — Assessment & Plan Note (Signed)
She does have some mild glucose intolerance advised to continue with low-carb high-fat diet Watch her carbs a little bit closer.

## 2015-11-23 LAB — HEMOGLOBIN A1C
Hgb A1c MFr Bld: 5.4 % (ref <5.7)
Mean Plasma Glucose: 108 mg/dL

## 2015-11-28 ENCOUNTER — Other Ambulatory Visit: Payer: Self-pay | Admitting: *Deleted

## 2015-11-28 MED ORDER — THYROID 65 MG PO TABS: 162.5000 mg | ORAL_TABLET | Freq: Every day | ORAL | 3 refills | Status: DC

## 2015-12-30 ENCOUNTER — Encounter: Payer: Self-pay | Admitting: Family Medicine

## 2015-12-30 NOTE — Telephone Encounter (Signed)
Call placed to patient. LMTRC.  

## 2016-01-02 MED ORDER — THYROID 60 MG PO TABS: 150.0000 mg | ORAL_TABLET | Freq: Every day | ORAL | 1 refills | Status: DC

## 2016-01-02 NOTE — Telephone Encounter (Signed)
Call placed to patient and patient made aware.   Prescription sent to pharmacy.  

## 2016-01-19 ENCOUNTER — Other Ambulatory Visit: Payer: Self-pay | Admitting: Family Medicine

## 2016-01-19 DIAGNOSIS — Z1231 Encounter for screening mammogram for malignant neoplasm of breast: Secondary | ICD-10-CM

## 2016-01-26 ENCOUNTER — Other Ambulatory Visit: Payer: Self-pay | Admitting: *Deleted

## 2016-01-26 MED ORDER — THYROID 60 MG PO TABS: 150.0000 mg | ORAL_TABLET | Freq: Every day | ORAL | 1 refills | Status: DC

## 2016-01-26 NOTE — Telephone Encounter (Signed)
Received fax requesting refill on thyroid medication.   Refill appropriate and filled per protocol.

## 2016-02-14 ENCOUNTER — Other Ambulatory Visit: Payer: Self-pay | Admitting: Family Medicine

## 2016-02-20 ENCOUNTER — Ambulatory Visit
Admission: RE | Admit: 2016-02-20 | Discharge: 2016-02-20 | Disposition: A | Discharge status: Home / Self Care | Payer: BLUE CROSS/BLUE SHIELD | Source: Ambulatory Visit | Attending: Family Medicine | Admitting: Family Medicine

## 2016-02-20 DIAGNOSIS — Z1231 Encounter for screening mammogram for malignant neoplasm of breast: Secondary | ICD-10-CM

## 2016-02-21 ENCOUNTER — Ambulatory Visit (INDEPENDENT_AMBULATORY_CARE_PROVIDER_SITE_OTHER): Payer: BLUE CROSS/BLUE SHIELD | Admitting: Family Medicine

## 2016-02-21 ENCOUNTER — Encounter: Payer: Self-pay | Admitting: Family Medicine

## 2016-02-21 VITALS — BP 128/78 | HR 72 | Temp 98.6°F | Resp 14 | Ht 67.0 in | Wt 245.0 lb

## 2016-02-21 DIAGNOSIS — E038 Other specified hypothyroidism: Secondary | ICD-10-CM

## 2016-02-21 DIAGNOSIS — Z6838 Body mass index (BMI) 38.0-38.9, adult: Secondary | ICD-10-CM

## 2016-02-21 DIAGNOSIS — Z0000 Encounter for general adult medical examination without abnormal findings: Secondary | ICD-10-CM

## 2016-02-21 DIAGNOSIS — E6609 Other obesity due to excess calories: Secondary | ICD-10-CM

## 2016-02-21 DIAGNOSIS — I10 Essential (primary) hypertension: Secondary | ICD-10-CM

## 2016-02-21 DIAGNOSIS — IMO0001 Reserved for inherently not codable concepts without codable children: Secondary | ICD-10-CM

## 2016-02-21 DIAGNOSIS — R109 Unspecified abdominal pain: Secondary | ICD-10-CM | POA: Diagnosis not present

## 2016-02-21 LAB — LIPID PANEL
Cholesterol: 200 mg/dL — ABNORMAL HIGH (ref <200)
HDL: 69 mg/dL (ref >50)
LDL CALC: 104 mg/dL — AB (ref <100)
TRIGLYCERIDES: 135 mg/dL (ref <150)
Total CHOL/HDL Ratio: 2.9 Ratio (ref <5.0)
VLDL: 27 mg/dL (ref <30)

## 2016-02-21 LAB — URINALYSIS, MICROSCOPIC ONLY
CRYSTALS: NONE SEEN [HPF]
Casts: NONE SEEN [LPF]
Yeast: NONE SEEN [HPF]

## 2016-02-21 LAB — CBC WITH DIFFERENTIAL/PLATELET
BASOS PCT: 0 %
Basophils Absolute: 0 cells/uL (ref 0–200)
EOS PCT: 1 %
Eosinophils Absolute: 60 cells/uL (ref 15–500)
HEMATOCRIT: 41.5 % (ref 35.0–45.0)
HEMOGLOBIN: 13.3 g/dL (ref 12.0–15.0)
LYMPHS ABS: 1440 {cells}/uL (ref 850–3900)
Lymphocytes Relative: 24 %
MCH: 29.4 pg (ref 27.0–33.0)
MCHC: 32 g/dL (ref 32.0–36.0)
MCV: 91.8 fL (ref 80.0–100.0)
MONO ABS: 480 {cells}/uL (ref 200–950)
MPV: 11.3 fL (ref 7.5–12.5)
Monocytes Relative: 8 %
NEUTROS ABS: 4020 {cells}/uL (ref 1500–7800)
NEUTROS PCT: 67 %
Platelets: 202 10*3/uL (ref 140–400)
RBC: 4.52 MIL/uL (ref 3.80–5.10)
RDW: 13.1 % (ref 11.0–15.0)
WBC: 6 10*3/uL (ref 3.8–10.8)

## 2016-02-21 LAB — COMPREHENSIVE METABOLIC PANEL
ALBUMIN: 3.8 g/dL (ref 3.6–5.1)
ALK PHOS: 60 U/L (ref 33–130)
ALT: 13 U/L (ref 6–29)
AST: 13 U/L (ref 10–35)
BILIRUBIN TOTAL: 0.4 mg/dL (ref 0.2–1.2)
BUN: 18 mg/dL (ref 7–25)
CALCIUM: 8.9 mg/dL (ref 8.6–10.4)
CO2: 27 mmol/L (ref 20–31)
CREATININE: 0.91 mg/dL (ref 0.50–0.99)
Chloride: 106 mmol/L (ref 98–110)
Glucose, Bld: 98 mg/dL (ref 70–99)
Potassium: 4.7 mmol/L (ref 3.5–5.3)
SODIUM: 139 mmol/L (ref 135–146)
Total Protein: 6.3 g/dL (ref 6.1–8.1)

## 2016-02-21 LAB — T4, FREE: FREE T4: 0.8 ng/dL (ref 0.8–1.8)

## 2016-02-21 LAB — URINALYSIS, ROUTINE W REFLEX MICROSCOPIC
Bilirubin Urine: NEGATIVE
GLUCOSE, UA: NEGATIVE
Ketones, ur: NEGATIVE
Nitrite: NEGATIVE
SPECIFIC GRAVITY, URINE: 1.02 (ref 1.001–1.035)
pH: 7 (ref 5.0–8.0)

## 2016-02-21 LAB — TSH: TSH: 0.51 m[IU]/L

## 2016-02-21 LAB — T3, FREE: T3, Free: 2.6 pg/mL (ref 2.3–4.2)

## 2016-02-21 MED ORDER — DICLOFENAC SODIUM 1 % TD GEL: TRANSDERMAL | 2 refills | Status: DC

## 2016-02-21 NOTE — Assessment & Plan Note (Signed)
Recheck TFT 

## 2016-02-21 NOTE — Patient Instructions (Signed)
I recommend eye visit once a year I recommend dental visit every 6 months Goal is to  Exercise 30 minutes 5 days a week We will send a letter with lab results  F/U 6 months  

## 2016-02-21 NOTE — Progress Notes (Signed)
   Subjective:    Patient ID: Crystal Waters, female    DOB: [birth year], 60 y.o.   MRN: [id number]  Patient presents for CPE (is fasting)  Medications reviewed Mammogram- Had yesterday Due for shingles vaccine- declines today  TDAP UTD Flu shot UTD   Has pain on right side, between RUQ andright flank, has urinary frequency at night which is not new ,She denies any hematuria denies any burning with urination. She's been going to the chiropractor but is still very tender on the right side especially if you push on it. She denies any particular injury but does often rest the right side on her desk at work.   Review Of Systems:  GEN- denies fatigue, fever, weight loss,weakness, recent illness HEENT- denies eye drainage, change in vision, nasal discharge, CVS- denies chest pain, palpitations RESP- denies SOB, cough, wheeze ABD- denies N/V, change in stools, abd pain GU- denies dysuria, hematuria, dribbling, incontinence MSK- denies joint pain, muscle aches, injury Neuro- denies headache, dizziness, syncope, seizure activity       Objective:    BP 128/78 (BP Location: Left Arm, Patient Position: Sitting, Cuff Size: Large)   Pulse 72   Temp 98.6 F (37 C) (Oral)   Resp 14   Ht 5\' 7"  (1.702 m)   Wt 245 lb (111.1 kg)   SpO2 98%   BMI 38.37 kg/m  GEN- NAD, alert and oriented x3 HEENT- PERRL, EOMI, non injected sclera, pink conjunctiva, MMM, oropharynx clear Neck- Supple, no thyromegaly CVS- RRR, no murmur RESP-CTAB ABD-NABS,soft,NT,ND, equivical CVA tenderness Chest wall- TTP Lower right ribs  EXT- No edema Pulses- Radial, DP- 2+        Assessment & Plan:      Problem List Items Addressed This Visit    Obesity   Hypothyroidism    Recheck TFT      Relevant Orders   TSH   T3, free   T4, free   Essential hypertension, benign    Controlled no changes       Relevant Orders   CBC with Differential/Platelet    Other Visit Diagnoses    Routine general medical  examination at a health care facility    -  Primary   CPE done, prevention UTD, fasting labs today, discussed weight, exercise   Relevant Orders   CBC with Differential/Platelet   Comprehensive metabolic panel   Lipid panel   Right flank pain       UA shows large hematuria, ? Stone but exam moer consistent with MSK pain on right side. Send culture, obtain KUB   Relevant Orders   Urinalysis, Routine w reflex microscopic (Completed)   Urine culture   DG Abd 1 View      Note: This dictation was prepared with Dragon dictation along with smaller phrase technology. Any transcriptional errors that result from this process are unintentional.

## 2016-02-21 NOTE — Assessment & Plan Note (Signed)
Controlled no changes 

## 2016-02-22 ENCOUNTER — Ambulatory Visit
Admission: RE | Admit: 2016-02-22 | Discharge: 2016-02-22 | Disposition: A | Discharge status: Home / Self Care | Payer: BLUE CROSS/BLUE SHIELD | Source: Ambulatory Visit | Attending: Family Medicine | Admitting: Family Medicine

## 2016-02-22 ENCOUNTER — Other Ambulatory Visit: Payer: Self-pay | Admitting: *Deleted

## 2016-02-22 DIAGNOSIS — R109 Unspecified abdominal pain: Secondary | ICD-10-CM

## 2016-02-22 DIAGNOSIS — K8000 Calculus of gallbladder with acute cholecystitis without obstruction: Secondary | ICD-10-CM

## 2016-02-22 LAB — URINE CULTURE

## 2016-02-27 ENCOUNTER — Telehealth: Payer: Self-pay | Admitting: *Deleted

## 2016-02-27 NOTE — Telephone Encounter (Signed)
Received call from patient.   Reports that she was reviewing imaging report and noted phlebolith finding.   States that she has some concerns in regards to phlebolith.   Advised that phlebolith is an incidental finding of calcification within a vein that is very common in the veins of the lower part of the pelvis, and are generally of no clinical importance.  Verbalized understanding.

## 2016-02-28 ENCOUNTER — Telehealth: Payer: Self-pay | Admitting: Family Medicine

## 2016-02-28 NOTE — Telephone Encounter (Signed)
Pt seen dentist on the 5th.  Seeing Surgery next week about gall bladder.  Dentist is reporting she has a tooth abscess.  Dentist is wanting to remove but pt wants to wait until after gall bladder surgery.  Will she need antibiotics to take to prevent problems??

## 2016-02-28 NOTE — Telephone Encounter (Signed)
Give amoxicillin 500mg  TID for 7 days

## 2016-02-29 MED ORDER — AMOXICILLIN 500 MG PO CAPS: 500.0000 mg | ORAL_CAPSULE | Freq: Three times a day (TID) | ORAL | 0 refills | Status: DC

## 2016-02-29 NOTE — Telephone Encounter (Signed)
Prescription sent to pharmacy. .   Call placed to patient and patient made aware.  

## 2016-03-07 ENCOUNTER — Ambulatory Visit: Payer: Self-pay | Admitting: General Surgery

## 2016-03-08 ENCOUNTER — Encounter: Payer: Self-pay | Admitting: Family Medicine

## 2016-03-09 MED ORDER — AMOXICILLIN 500 MG PO CAPS: 500.0000 mg | ORAL_CAPSULE | Freq: Three times a day (TID) | ORAL | 0 refills | Status: DC

## 2016-03-20 ENCOUNTER — Encounter: Payer: Self-pay | Admitting: Family Medicine

## 2016-04-03 ENCOUNTER — Inpatient Hospital Stay (HOSPITAL_COMMUNITY)
Admission: RE | Admit: 2016-04-03 | Discharge: 2016-04-03 | Disposition: A | Discharge status: Home / Self Care | Payer: BLUE CROSS/BLUE SHIELD | Source: Ambulatory Visit

## 2016-04-03 NOTE — Pre-Procedure Instructions (Signed)
Mahnoor Fitzner  04/03/2016      [id number] [place], [place] - [address] [place] [address] Phone: [phone] Fax: [phone]    Your procedure is scheduled on  Wednesday  04/11/16  Report to [facility] Admitting at 1145 A.M.  Call this number if you have problems the morning of surgery:  [phone]   Remember:  Do not eat food or drink liquids after midnight.  Take these medicines the morning of surgery with A SIP OF WATER   ALPRAZOLAM IF NEEDED, BUPROPION, RANITIDINE, THYROID   (STOP 7 DAYS PRIOR TO SURGERY ASPIRIN OR ASPIRIN PRODUCTS, IBUPROFEN/ ADVIL/ MOTRIN, DICLOFENAC/ VOLTAREN GEL, GOODY POWDERS, BC'S ,HERBAL MEDICINES)   Do not wear jewelry, make-up or nail polish.  Do not wear lotions, powders, or perfumes, or deoderant.  Do not shave 48 hours prior to surgery.  Men may shave face and neck.  Do not bring valuables to the hospital.  [facility] is not responsible for any belongings or valuables.  Contacts, dentures or bridgework may not be worn into surgery.  Leave your suitcase in the car.  After surgery it may be brought to your room.  For patients admitted to the hospital, discharge time will be determined by your treatment team.  Patients discharged the day of surgery will not be allowed to drive home.   Name and phone number of your driver:    Special instructions:  [facility] - Preparing for Surgery  Before surgery, you can play an important role.  Because skin is not sterile, your skin needs to be as free of germs as possible.  You can reduce the number of germs on you skin by washing with CHG (chlorahexidine gluconate) soap before surgery.  CHG is an antiseptic cleaner which kills germs and bonds with the skin to continue killing germs even after washing.  Please DO NOT use if you have an allergy to CHG or antibacterial soaps.  If your skin becomes reddened/irritated stop using  the CHG and inform your nurse when you arrive at Short Stay.  Do not shave (including legs and underarms) for at least 48 hours prior to the first CHG shower.  You may shave your face.  Please follow these instructions carefully:   1.  Shower with CHG Soap the night before surgery and the                                morning of Surgery.  2.  If you choose to wash your hair, wash your hair first as usual with your       normal shampoo.  3.  After you shampoo, rinse your hair and body thoroughly to remove the                      Shampoo.  4.  Use CHG as you would any other liquid soap.  You can apply chg directly       to the skin and wash gently with scrungie or a clean washcloth.  5.  Apply the CHG Soap to your body ONLY FROM THE NECK DOWN.        Do not use on open wounds or open sores.  Avoid contact with your eyes,       ears, mouth and genitals (private parts).  Wash genitals (private parts)  with your normal soap.  6.  Wash thoroughly, paying special attention to the area where your surgery        will be performed.  7.  Thoroughly rinse your body with warm water from the neck down.  8.  DO NOT shower/wash with your normal soap after using and rinsing off       the CHG Soap.  9.  Pat yourself dry with a clean towel.            10.  Wear clean pajamas.            11.  Place clean sheets on your bed the night of your first shower and do not        sleep with pets.  Day of Surgery  Do not apply any lotions/deoderants the morning of surgery.  Please wear clean clothes to the hospital/surgery center.    Please read over the following fact sheets that you were given. Surgical Site Infection Prevention

## 2016-04-10 ENCOUNTER — Encounter (HOSPITAL_COMMUNITY)
Admission: RE | Admit: 2016-04-10 | Discharge: 2016-04-10 | Disposition: A | Discharge status: Home / Self Care | Payer: BLUE CROSS/BLUE SHIELD | Source: Ambulatory Visit | Attending: General Surgery | Admitting: General Surgery

## 2016-04-10 ENCOUNTER — Encounter (HOSPITAL_COMMUNITY): Payer: Self-pay

## 2016-04-10 ENCOUNTER — Encounter (HOSPITAL_COMMUNITY): Payer: Self-pay | Admitting: *Deleted

## 2016-04-10 DIAGNOSIS — I10 Essential (primary) hypertension: Secondary | ICD-10-CM | POA: Diagnosis not present

## 2016-04-10 DIAGNOSIS — K8010 Calculus of gallbladder with chronic cholecystitis without obstruction: Secondary | ICD-10-CM | POA: Diagnosis not present

## 2016-04-10 DIAGNOSIS — Z87891 Personal history of nicotine dependence: Secondary | ICD-10-CM | POA: Diagnosis not present

## 2016-04-10 DIAGNOSIS — K219 Gastro-esophageal reflux disease without esophagitis: Secondary | ICD-10-CM | POA: Diagnosis not present

## 2016-04-10 DIAGNOSIS — E039 Hypothyroidism, unspecified: Secondary | ICD-10-CM | POA: Diagnosis not present

## 2016-04-10 DIAGNOSIS — Z79899 Other long term (current) drug therapy: Secondary | ICD-10-CM | POA: Diagnosis not present

## 2016-04-10 DIAGNOSIS — R1011 Right upper quadrant pain: Secondary | ICD-10-CM | POA: Diagnosis present

## 2016-04-10 DIAGNOSIS — F329 Major depressive disorder, single episode, unspecified: Secondary | ICD-10-CM | POA: Diagnosis not present

## 2016-04-10 HISTORY — DX: Personal history of other (healed) physical injury and trauma: Z87.828

## 2016-04-10 HISTORY — DX: Hypothyroidism, unspecified: E03.9

## 2016-04-10 HISTORY — DX: Personal history of urinary calculi: Z87.442

## 2016-04-10 HISTORY — DX: Constipation, unspecified: K59.00

## 2016-04-10 HISTORY — DX: Gestational diabetes mellitus in pregnancy, unspecified control: O24.419

## 2016-04-10 HISTORY — DX: Post-traumatic stress disorder, unspecified: F43.10

## 2016-04-10 LAB — CBC WITH DIFFERENTIAL/PLATELET
BASOS PCT: 0 %
Basophils Absolute: 0 10*3/uL (ref 0.0–0.1)
EOS ABS: 0.1 10*3/uL (ref 0.0–0.7)
EOS PCT: 1 %
HCT: 40.9 % (ref 36.0–46.0)
Hemoglobin: 13.3 g/dL (ref 12.0–15.0)
Lymphocytes Relative: 26 %
Lymphs Abs: 1.5 10*3/uL (ref 0.7–4.0)
MCH: 29 pg (ref 26.0–34.0)
MCHC: 32.5 g/dL (ref 30.0–36.0)
MCV: 89.1 fL (ref 78.0–100.0)
MONO ABS: 0.5 10*3/uL (ref 0.1–1.0)
Monocytes Relative: 8 %
NEUTROS PCT: 65 %
Neutro Abs: 3.6 10*3/uL (ref 1.7–7.7)
PLATELETS: 172 10*3/uL (ref 150–400)
RBC: 4.59 MIL/uL (ref 3.87–5.11)
RDW: 13.1 % (ref 11.5–15.5)
WBC: 5.6 10*3/uL (ref 4.0–10.5)

## 2016-04-10 LAB — COMPREHENSIVE METABOLIC PANEL
ALBUMIN: 3.7 g/dL (ref 3.5–5.0)
ALT: 17 U/L (ref 14–54)
ANION GAP: 7 (ref 5–15)
AST: 19 U/L (ref 15–41)
Alkaline Phosphatase: 57 U/L (ref 38–126)
BUN: 11 mg/dL (ref 6–20)
CO2: 25 mmol/L (ref 22–32)
Calcium: 9.5 mg/dL (ref 8.9–10.3)
Chloride: 107 mmol/L (ref 101–111)
Creatinine, Ser: 1.03 mg/dL — ABNORMAL HIGH (ref 0.44–1.00)
GFR calc non Af Amer: 58 mL/min — ABNORMAL LOW (ref >60)
GLUCOSE: 91 mg/dL (ref 65–99)
POTASSIUM: 4.5 mmol/L (ref 3.5–5.1)
SODIUM: 139 mmol/L (ref 135–145)
TOTAL PROTEIN: 6.9 g/dL (ref 6.5–8.1)
Total Bilirubin: 0.4 mg/dL (ref 0.3–1.2)

## 2016-04-10 NOTE — Progress Notes (Signed)
Crystal Waters had not been instructed to stop NSAIDs, she took 2, 200 mg this am.  Patient instructed to not take Advil or use Voltaren gel or take vitamins, herbal products ,naproxen or Aspirin, Aspirin Products.

## 2016-04-11 ENCOUNTER — Encounter (HOSPITAL_COMMUNITY)
Admission: RE | Disposition: A | Discharge status: Home / Self Care | Payer: Self-pay | Source: Ambulatory Visit | Attending: General Surgery

## 2016-04-11 ENCOUNTER — Encounter (HOSPITAL_COMMUNITY): Payer: Self-pay | Admitting: *Deleted

## 2016-04-11 ENCOUNTER — Ambulatory Visit (HOSPITAL_COMMUNITY)
Admission: RE | Admit: 2016-04-11 | Discharge: 2016-04-11 | Disposition: A | Discharge status: Home / Self Care | Payer: BLUE CROSS/BLUE SHIELD | Source: Ambulatory Visit | Attending: General Surgery | Admitting: General Surgery

## 2016-04-11 ENCOUNTER — Ambulatory Visit (HOSPITAL_COMMUNITY): Payer: BLUE CROSS/BLUE SHIELD | Admitting: Certified Registered Nurse Anesthetist

## 2016-04-11 DIAGNOSIS — K8010 Calculus of gallbladder with chronic cholecystitis without obstruction: Secondary | ICD-10-CM | POA: Diagnosis not present

## 2016-04-11 DIAGNOSIS — F329 Major depressive disorder, single episode, unspecified: Secondary | ICD-10-CM | POA: Insufficient documentation

## 2016-04-11 DIAGNOSIS — Z79899 Other long term (current) drug therapy: Secondary | ICD-10-CM | POA: Insufficient documentation

## 2016-04-11 DIAGNOSIS — K219 Gastro-esophageal reflux disease without esophagitis: Secondary | ICD-10-CM | POA: Insufficient documentation

## 2016-04-11 DIAGNOSIS — Z87891 Personal history of nicotine dependence: Secondary | ICD-10-CM | POA: Insufficient documentation

## 2016-04-11 DIAGNOSIS — I10 Essential (primary) hypertension: Secondary | ICD-10-CM | POA: Insufficient documentation

## 2016-04-11 DIAGNOSIS — E039 Hypothyroidism, unspecified: Secondary | ICD-10-CM | POA: Insufficient documentation

## 2016-04-11 HISTORY — DX: Other specified postprocedural states: Z98.890

## 2016-04-11 HISTORY — PX: CHOLECYSTECTOMY: SHX55

## 2016-04-11 HISTORY — DX: Adverse effect of unspecified anesthetic, initial encounter: T41.45XA

## 2016-04-11 HISTORY — DX: Other complications of anesthesia, initial encounter: T88.59XA

## 2016-04-11 HISTORY — DX: Other specified postprocedural states: R11.2

## 2016-04-11 SURGERY — LAPAROSCOPIC CHOLECYSTECTOMY WITH INTRAOPERATIVE CHOLANGIOGRAM
Anesthesia: General | Site: Abdomen

## 2016-04-11 MED ORDER — LACTATED RINGERS IV SOLN
INTRAVENOUS | Status: DC
Start: 2016-04-11 — End: 2016-04-11
  Administered 2016-04-11 (×3): via INTRAVENOUS

## 2016-04-11 MED ORDER — ROCURONIUM BROMIDE 100 MG/10ML IV SOLN
INTRAVENOUS | Status: DC | PRN
  Administered 2016-04-11 (×4): 10 mg via INTRAVENOUS
  Administered 2016-04-11: 50 mg via INTRAVENOUS

## 2016-04-11 MED ORDER — SUGAMMADEX SODIUM 200 MG/2ML IV SOLN
INTRAVENOUS | Status: DC | PRN
Start: 2016-04-11 — End: 2016-04-11
  Administered 2016-04-11: 200 mg via INTRAVENOUS

## 2016-04-11 MED ORDER — FENTANYL CITRATE (PF) 100 MCG/2ML IJ SOLN
INTRAMUSCULAR | Status: DC | PRN
  Administered 2016-04-11: 50 ug via INTRAVENOUS
  Administered 2016-04-11 (×2): 25 ug via INTRAVENOUS
  Administered 2016-04-11: 50 ug via INTRAVENOUS
  Administered 2016-04-11: 100 ug via INTRAVENOUS

## 2016-04-11 MED ORDER — FENTANYL CITRATE (PF) 100 MCG/2ML IJ SOLN
INTRAMUSCULAR | Status: AC
  Filled 2016-04-11: qty 2

## 2016-04-11 MED ORDER — MIDAZOLAM HCL 5 MG/5ML IJ SOLN
INTRAMUSCULAR | Status: DC | PRN
  Administered 2016-04-11: 2 mg via INTRAVENOUS

## 2016-04-11 MED ORDER — IOPAMIDOL (ISOVUE-300) INJECTION 61%
INTRAVENOUS | Status: AC
  Filled 2016-04-11: qty 50

## 2016-04-11 MED ORDER — LIDOCAINE 2% (20 MG/ML) 5 ML SYRINGE
INTRAMUSCULAR | Status: AC
  Filled 2016-04-11: qty 5

## 2016-04-11 MED ORDER — HEMOSTATIC AGENTS (NO CHARGE) OPTIME
TOPICAL | Status: DC | PRN
  Administered 2016-04-11: 1 via TOPICAL

## 2016-04-11 MED ORDER — SUGAMMADEX SODIUM 200 MG/2ML IV SOLN
INTRAVENOUS | Status: AC
  Filled 2016-04-11: qty 2

## 2016-04-11 MED ORDER — ACETAMINOPHEN 160 MG/5ML PO SOLN
960.0000 mg | Freq: Once | ORAL | Status: AC
  Administered 2016-04-11: 960 mg via ORAL
  Filled 2016-04-11: qty 30

## 2016-04-11 MED ORDER — SUCCINYLCHOLINE CHLORIDE 20 MG/ML IJ SOLN
INTRAMUSCULAR | Status: DC | PRN
  Administered 2016-04-11: 80 mg via INTRAVENOUS

## 2016-04-11 MED ORDER — BUPIVACAINE HCL (PF) 0.25 % IJ SOLN
INTRAMUSCULAR | Status: AC
  Filled 2016-04-11: qty 30

## 2016-04-11 MED ORDER — EPHEDRINE 5 MG/ML INJ
INTRAVENOUS | Status: AC
  Filled 2016-04-11: qty 10

## 2016-04-11 MED ORDER — LIDOCAINE HCL (CARDIAC) 20 MG/ML IV SOLN
INTRAVENOUS | Status: DC | PRN
  Administered 2016-04-11: 60 mg via INTRAVENOUS

## 2016-04-11 MED ORDER — SODIUM CHLORIDE 0.9 % IR SOLN
Status: DC | PRN
  Administered 2016-04-11: 1000 mL

## 2016-04-11 MED ORDER — BUPIVACAINE HCL (PF) 0.25 % IJ SOLN
INTRAMUSCULAR | Status: DC | PRN
  Administered 2016-04-11: 27 mL

## 2016-04-11 MED ORDER — ROCURONIUM BROMIDE 50 MG/5ML IV SOSY
PREFILLED_SYRINGE | INTRAVENOUS | Status: AC
  Filled 2016-04-11: qty 10

## 2016-04-11 MED ORDER — PHENYLEPHRINE 40 MCG/ML (10ML) SYRINGE FOR IV PUSH (FOR BLOOD PRESSURE SUPPORT)
PREFILLED_SYRINGE | INTRAVENOUS | Status: AC
  Filled 2016-04-11: qty 10

## 2016-04-11 MED ORDER — PHENYLEPHRINE HCL 10 MG/ML IJ SOLN
INTRAMUSCULAR | Status: DC | PRN
  Administered 2016-04-11: 80 ug via INTRAVENOUS

## 2016-04-11 MED ORDER — PROPOFOL 10 MG/ML IV BOLUS
INTRAVENOUS | Status: AC
  Filled 2016-04-11: qty 20

## 2016-04-11 MED ORDER — FENTANYL CITRATE (PF) 100 MCG/2ML IJ SOLN
INTRAMUSCULAR | Status: AC
  Filled 2016-04-11: qty 4

## 2016-04-11 MED ORDER — ONDANSETRON HCL 4 MG/2ML IJ SOLN
INTRAMUSCULAR | Status: AC
  Filled 2016-04-11: qty 2

## 2016-04-11 MED ORDER — SODIUM CHLORIDE 0.9 % IV SOLN
INTRAVENOUS | Status: DC | PRN
  Administered 2016-04-11: .1 mL

## 2016-04-11 MED ORDER — PROPOFOL 10 MG/ML IV BOLUS
INTRAVENOUS | Status: DC | PRN
  Administered 2016-04-11: 20 mg via INTRAVENOUS
  Administered 2016-04-11: 140 mg via INTRAVENOUS
  Administered 2016-04-11: 20 mg via INTRAVENOUS

## 2016-04-11 MED ORDER — MIDAZOLAM HCL 2 MG/2ML IJ SOLN
INTRAMUSCULAR | Status: AC
  Filled 2016-04-11: qty 2

## 2016-04-11 MED ORDER — FENTANYL CITRATE (PF) 100 MCG/2ML IJ SOLN
25.0000 ug | INTRAMUSCULAR | Status: DC | PRN
  Administered 2016-04-11 (×2): 50 ug via INTRAVENOUS

## 2016-04-11 MED ORDER — CEFAZOLIN SODIUM 1 G IJ SOLR
INTRAMUSCULAR | Status: DC | PRN
Start: 2016-04-11 — End: 2016-04-11
  Administered 2016-04-11: 2 g via INTRAMUSCULAR

## 2016-04-11 MED ORDER — ONDANSETRON HCL 4 MG/2ML IJ SOLN
INTRAMUSCULAR | Status: DC | PRN
  Administered 2016-04-11: 4 mg via INTRAVENOUS

## 2016-04-11 MED ORDER — 0.9 % SODIUM CHLORIDE (POUR BTL) OPTIME
TOPICAL | Status: DC | PRN
  Administered 2016-04-11: 1000 mL

## 2016-04-11 MED ORDER — SCOPOLAMINE 1 MG/3DAYS TD PT72
1.0000 | MEDICATED_PATCH | Freq: Once | TRANSDERMAL | Status: DC
  Administered 2016-04-11: 1.5 mg via TRANSDERMAL
  Filled 2016-04-11: qty 1

## 2016-04-11 MED ORDER — HYDROCODONE-ACETAMINOPHEN 5-325 MG PO TABS: 1.0000 | ORAL_TABLET | ORAL | 0 refills | Status: DC | PRN

## 2016-04-11 SURGICAL SUPPLY — 49 items
APPLIER CLIP 5 13 M/L LIGAMAX5 (MISCELLANEOUS) ×2
BLADE SURG ROTATE 9660 (MISCELLANEOUS) IMPLANT
CANISTER SUCTION 2500CC (MISCELLANEOUS) ×2 IMPLANT
CHLORAPREP W/TINT 26ML (MISCELLANEOUS) ×2 IMPLANT
CLIP APPLIE 5 13 M/L LIGAMAX5 (MISCELLANEOUS) ×1 IMPLANT
COVER MAYO STAND STRL (DRAPES) ×4 IMPLANT
COVER SURGICAL LIGHT HANDLE (MISCELLANEOUS) ×2 IMPLANT
DECANTER SPIKE VIAL GLASS SM (MISCELLANEOUS) ×2 IMPLANT
DERMABOND ADVANCED (GAUZE/BANDAGES/DRESSINGS) ×1
DERMABOND ADVANCED .7 DNX12 (GAUZE/BANDAGES/DRESSINGS) ×1 IMPLANT
DRAPE C-ARM 42X72 X-RAY (DRAPES) ×2 IMPLANT
ELECT REM PT RETURN 9FT ADLT (ELECTROSURGICAL) ×2
ELECTRODE REM PT RTRN 9FT ADLT (ELECTROSURGICAL) ×1 IMPLANT
ENDOLOOP SUT PDS II  0 18 (SUTURE) ×1
ENDOLOOP SUT PDS II 0 18 (SUTURE) ×1 IMPLANT
FILTER SMOKE EVAC LAPAROSHD (FILTER) ×2 IMPLANT
GLOVE BIO SURGEON STRL SZ8 (GLOVE) ×2 IMPLANT
GLOVE BIOGEL PI IND STRL 8 (GLOVE) ×1 IMPLANT
GLOVE BIOGEL PI INDICATOR 8 (GLOVE) ×1
GLOVE EUDERMIC 7 POWDERFREE (GLOVE) ×4 IMPLANT
GOWN STRL REUS W/ TWL LRG LVL3 (GOWN DISPOSABLE) ×3 IMPLANT
GOWN STRL REUS W/ TWL XL LVL3 (GOWN DISPOSABLE) ×2 IMPLANT
GOWN STRL REUS W/TWL LRG LVL3 (GOWN DISPOSABLE) ×3
GOWN STRL REUS W/TWL XL LVL3 (GOWN DISPOSABLE) ×2
KIT BASIN OR (CUSTOM PROCEDURE TRAY) ×2 IMPLANT
KIT ROOM TURNOVER OR (KITS) IMPLANT
L-HOOK LAP DISP 36CM (ELECTROSURGICAL) ×2
LHOOK LAP DISP 36CM (ELECTROSURGICAL) ×1 IMPLANT
NEEDLE 22X1 1/2 (OR ONLY) (NEEDLE) IMPLANT
NS IRRIG 1000ML POUR BTL (IV SOLUTION) ×2 IMPLANT
PAD ARMBOARD 7.5X6 YLW CONV (MISCELLANEOUS) ×2 IMPLANT
PENCIL BUTTON HOLSTER BLD 10FT (ELECTRODE) ×2 IMPLANT
POUCH RETRIEVAL ECOSAC 10 (ENDOMECHANICALS) ×1 IMPLANT
POUCH RETRIEVAL ECOSAC 10MM (ENDOMECHANICALS) ×1
SCISSORS LAP 5X35 DISP (ENDOMECHANICALS) ×2 IMPLANT
SET CHOLANGIOGRAPH 5 50 .035 (SET/KITS/TRAYS/PACK) ×2 IMPLANT
SET IRRIG TUBING LAPAROSCOPIC (IRRIGATION / IRRIGATOR) IMPLANT
SLEEVE ENDOPATH XCEL 5M (ENDOMECHANICALS) ×4 IMPLANT
SPECIMEN JAR SMALL (MISCELLANEOUS) ×2 IMPLANT
SUT NOVA 1 T20/GS 25DT (SUTURE) ×2 IMPLANT
SUT NOVA NAB DX-16 0-1 5-0 T12 (SUTURE) ×6 IMPLANT
SUT VIC AB 4-0 PS2 27 (SUTURE) ×2 IMPLANT
TOWEL OR 17X24 6PK STRL BLUE (TOWEL DISPOSABLE) IMPLANT
TOWEL OR 17X26 10 PK STRL BLUE (TOWEL DISPOSABLE) ×2 IMPLANT
TRAY LAPAROSCOPIC MC (CUSTOM PROCEDURE TRAY) ×2 IMPLANT
TROCAR BLADELESS 11MM (ENDOMECHANICALS) ×2 IMPLANT
TROCAR XCEL BLUNT TIP 100MML (ENDOMECHANICALS) ×2 IMPLANT
TROCAR XCEL NON-BLD 5MMX100MML (ENDOMECHANICALS) ×2 IMPLANT
TUBING INSUFFLATION (TUBING) ×2 IMPLANT

## 2016-04-11 NOTE — Transfer of Care (Signed)
Immediate Anesthesia Transfer of Care Note  Patient: Crystal Waters  Procedure(s) Performed: Procedure(s): LAPAROSCOPIC CHOLECYSTECTOMY WITH INTRAOPERATIVE CHOLANGIOGRAM (N/A)  Patient Location: PACU  Anesthesia Type:General  Level of Consciousness: lethargic and responds to stimulation  Airway & Oxygen Therapy: Patient Spontanous Breathing and Patient connected to nasal cannula oxygen  Post-op Assessment: Report given to RN  Post vital signs: Reviewed and stable  Last Vitals:  Vitals:   04/11/16 1143 04/11/16 1655  BP: (!) 171/69   Pulse: 86   Resp: 20   Temp: 37 C (P) 36.3 C    Last Pain:  Vitals:   04/11/16 1155  TempSrc:   PainSc: 1       Patients Stated Pain Goal: 4 ([id number])  Complications: No apparent anesthesia complications

## 2016-04-11 NOTE — Anesthesia Procedure Notes (Signed)
Procedure Name: Intubation Date/Time: 04/11/2016 2:41 PM Performed by: Scheryl Darter Pre-anesthesia Checklist: Patient identified, Emergency Drugs available, Suction available and Patient being monitored Patient Re-evaluated:Patient Re-evaluated prior to inductionOxygen Delivery Method: Circle System Utilized Preoxygenation: Pre-oxygenation with 100% oxygen Intubation Type: IV induction Ventilation: Mask ventilation without difficulty Laryngoscope Size: Miller and 2 Grade View: Grade I Tube type: Oral Tube size: 7.5 mm Number of attempts: 1 Airway Equipment and Method: Stylet and Oral airway Placement Confirmation: ETT inserted through vocal cords under direct vision,  positive ETCO2 and breath sounds checked- equal and bilateral Secured at: 21 cm Tube secured with: Tape Dental Injury: Teeth and Oropharynx as per pre-operative assessment
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
## 2016-04-11 NOTE — Anesthesia Preprocedure Evaluation (Signed)
Anesthesia Evaluation  Patient identified by MRN, date of birth, ID band Patient awake    Reviewed: Allergy & Precautions, H&P , Patient's Chart, lab work & pertinent test results, reviewed documented beta blocker date and time   Airway Mallampati: II  TM Distance: >3 FB Neck ROM: full    Dental no notable dental hx.    Pulmonary former smoker,    Pulmonary exam normal breath sounds clear to auscultation       Cardiovascular hypertension,  Rhythm:regular Rate:Normal     Neuro/Psych    GI/Hepatic   Endo/Other  diabetes  Renal/GU      Musculoskeletal   Abdominal   Peds  Hematology   Anesthesia Other Findings   Reproductive/Obstetrics                             Anesthesia Physical Anesthesia Plan  ASA: II  Anesthesia Plan: General   Post-op Pain Management:    Induction: Intravenous  Airway Management Planned: Oral ETT  Additional Equipment:   Intra-op Plan:   Post-operative Plan: Extubation in OR  Informed Consent: I have reviewed the patients History and Physical, chart, labs and discussed the procedure including the risks, benefits and alternatives for the proposed anesthesia with the patient or authorized representative who has indicated his/her understanding and acceptance.   Dental Advisory Given and Dental advisory given  Plan Discussed with: CRNA and Surgeon  Anesthesia Plan Comments: (  Discussed general anesthesia, including possible nausea, instrumentation of airway, sore throat,pulmonary aspiration, etc. I asked if the were any outstanding questions, or  concerns before we proceeded.)        Anesthesia Quick Evaluation

## 2016-04-11 NOTE — Op Note (Signed)
04/11/2016  4:43 PM  PATIENT:  Crystal Waters  61 y.o. female  PRE-OPERATIVE DIAGNOSIS:  CHRONIC CHOLECYSTITIS  POST-OPERATIVE DIAGNOSIS:  CHRONIC CHOLECYSTITIS  PROCEDURE:  Procedure(s): LAPAROSCOPIC CHOLECYSTECTOMY  SURGEON:  Georganna Skeans, MD  ASSISTANTS: Fanny Skates, MD   ANESTHESIA:   local and general  EBL:  Total I/O In: 1000 [I.V.:1000] Out: -   BLOOD ADMINISTERED:none  DRAINS: none   SPECIMEN:  Excision  DISPOSITION OF SPECIMEN:  PATHOLOGY  COUNTS:  YES  DICTATION: .Dragon Dictation Findings: Chronic cholecystitis, large gallstone cast of gallbladder  Procedure in detail: [name] presents for cholecystectomy. She was identified in the preop holding area. Informed consent was obtained. She received intravenous antibiotics. She was brought to the operating room and general endotracheal anesthesia was administered by the anesthesia staff. Her abdomen was prepped and draped in sterile fashion. Time out procedure was performed.The infraumbilical region was infiltrated with local. Infraumbilical incision was made. Subcutaneous tissues were dissected down revealing the anterior fascia. This was divided sharply along the midline. Peritoneal cavity was entered under direct vision without complication. A 0 Vicryl pursestring was placed around the fascial opening. Hassan trocar was inserted into the abdomen. The abdomen was insufflated with carbon dioxide in standard fashion. Under direct vision a 5 mm epigastric and 2 right-sided 5 mm ports were placed. Local was used at each port site. Laparoscopic expiration revealed evidence of chronic cholecystitis with omental adhesions to the gallbladder. These were swept away. The gallbladder was noted to be very firm consistent with a gallstone cast. We upsized the lateral right port to 11 mm to place a different retraction instrument. The dome the gallbladder was retracted superior medially and the infundibulum was retracted laterally.  We well-defined cystic artery initially. This was circumferentially dissected, clipped twice proximally and once distally and divided. We struggled with retraction as the gallbladder was difficult to grasp as it was entirely filled by one stone. Decision was made to transition to dome down approach. The gallbladder was dissected off the liver bed using cautery achieving excellent hemostasis. We were able to get down to the infundibulum carefully and with good visualization. Once we were happy with the anatomy, we divided the gallbladder at the distal infundibulum. It was placed in a bag and removed from the abdomen. A small stone was milked out of the cystic duct. We were unable to do a cholangiogram. The cystic duct was then secured with an Endoloop of PDS 2. There was excellent closure. Liver bed was cauterized to get good hemostasis and the whole area was copiously irrigated. Liver bed remained dry. Ports were removed under direct vision. Pneumoperitoneum was released. Infraumbilical fascia was closed in an open fashion with multiple interrupted #1 Novafil sutures. All 4 wounds were irrigated and the skin of each was closed with running 4 Vicryl followed by Dermabond. All counts were correct. She tolerated procedure well without apparent complications and was taken recovery in stable condition.  PATIENT DISPOSITION:  PACU - hemodynamically stable.   Delay start of Pharmacological VTE agent (>24hrs) due to surgical blood loss or risk of bleeding:  no  Georganna Skeans, MD, MPH, FACS Pager: [phone]  1/24/20184:43 PM

## 2016-04-11 NOTE — Interval H&P Note (Signed)
History and Physical Interval Note:  04/11/2016 1:47 PM  Crystal Waters  has presented today for surgery, with the diagnosis of CHRONIC CHOLECYSTITIS  The various methods of treatment have been discussed with the patient and family. After consideration of risks, benefits and other options for treatment, the patient has consented to  Procedure(s): LAPAROSCOPIC CHOLECYSTECTOMY WITH INTRAOPERATIVE CHOLANGIOGRAM (N/A) as a surgical intervention .  The patient's history has been reviewed, patient examined, no change in status, stable for surgery.  I have reviewed the patient's chart and labs.  Questions were answered to the patient's satisfaction.     Crystal Waters E

## 2016-04-12 ENCOUNTER — Encounter (HOSPITAL_COMMUNITY): Payer: Self-pay | Admitting: General Surgery

## 2016-04-12 NOTE — Anesthesia Postprocedure Evaluation (Signed)
Anesthesia Post Note  Patient: Crystal Waters  Procedure(s) Performed: Procedure(s) (LRB): LAPAROSCOPIC CHOLECYSTECTOMY WITH INTRAOPERATIVE CHOLANGIOGRAM (N/A)  Patient location during evaluation: PACU Anesthesia Type: General Level of consciousness: awake and alert Pain management: pain level controlled Vital Signs Assessment: post-procedure vital signs reviewed and stable Respiratory status: spontaneous breathing, nonlabored ventilation, respiratory function stable and patient connected to nasal cannula oxygen Cardiovascular status: blood pressure returned to baseline and stable Postop Assessment: no signs of nausea or vomiting Anesthetic complications: no       Last Vitals:  Vitals:   04/11/16 1925 04/11/16 1940  BP: (!) 154/75 (!) 150/89  Pulse: 71 76  Resp: 18 (!) 24  Temp:  36.1 C    Last Pain:  Vitals:   04/11/16 1940  TempSrc:   PainSc: 3                  Montez Hageman

## 2016-04-17 ENCOUNTER — Emergency Department (HOSPITAL_COMMUNITY): Payer: BLUE CROSS/BLUE SHIELD

## 2016-04-17 ENCOUNTER — Inpatient Hospital Stay (HOSPITAL_COMMUNITY)
Admission: EM | Admit: 2016-04-17 | Discharge: 2016-04-22 | DRG: 354 | Disposition: A | Discharge status: Home / Self Care | Payer: BLUE CROSS/BLUE SHIELD | Attending: General Surgery | Admitting: General Surgery

## 2016-04-17 ENCOUNTER — Encounter (HOSPITAL_COMMUNITY)
Admission: EM | Disposition: A | Discharge status: Home / Self Care | Payer: Self-pay | Source: Home / Self Care | Attending: General Surgery

## 2016-04-17 ENCOUNTER — Encounter (HOSPITAL_COMMUNITY): Payer: Self-pay | Admitting: Emergency Medicine

## 2016-04-17 DIAGNOSIS — K430 Incisional hernia with obstruction, without gangrene: Secondary | ICD-10-CM | POA: Diagnosis not present

## 2016-04-17 DIAGNOSIS — L0390 Cellulitis, unspecified: Secondary | ICD-10-CM | POA: Diagnosis not present

## 2016-04-17 DIAGNOSIS — R262 Difficulty in walking, not elsewhere classified: Secondary | ICD-10-CM

## 2016-04-17 DIAGNOSIS — Z87891 Personal history of nicotine dependence: Secondary | ICD-10-CM

## 2016-04-17 DIAGNOSIS — Z4659 Encounter for fitting and adjustment of other gastrointestinal appliance and device: Secondary | ICD-10-CM

## 2016-04-17 DIAGNOSIS — E119 Type 2 diabetes mellitus without complications: Secondary | ICD-10-CM | POA: Diagnosis present

## 2016-04-17 DIAGNOSIS — J45909 Unspecified asthma, uncomplicated: Secondary | ICD-10-CM | POA: Diagnosis present

## 2016-04-17 DIAGNOSIS — F329 Major depressive disorder, single episode, unspecified: Secondary | ICD-10-CM | POA: Diagnosis present

## 2016-04-17 DIAGNOSIS — E669 Obesity, unspecified: Secondary | ICD-10-CM | POA: Diagnosis present

## 2016-04-17 DIAGNOSIS — K56609 Unspecified intestinal obstruction, unspecified as to partial versus complete obstruction: Secondary | ICD-10-CM | POA: Diagnosis not present

## 2016-04-17 DIAGNOSIS — Z8249 Family history of ischemic heart disease and other diseases of the circulatory system: Secondary | ICD-10-CM

## 2016-04-17 DIAGNOSIS — F4310 Post-traumatic stress disorder, unspecified: Secondary | ICD-10-CM | POA: Diagnosis present

## 2016-04-17 DIAGNOSIS — K219 Gastro-esophageal reflux disease without esophagitis: Secondary | ICD-10-CM | POA: Diagnosis present

## 2016-04-17 DIAGNOSIS — Z91018 Allergy to other foods: Secondary | ICD-10-CM

## 2016-04-17 DIAGNOSIS — E039 Hypothyroidism, unspecified: Secondary | ICD-10-CM | POA: Diagnosis present

## 2016-04-17 DIAGNOSIS — I700 Atherosclerosis of aorta: Secondary | ICD-10-CM | POA: Diagnosis present

## 2016-04-17 DIAGNOSIS — E876 Hypokalemia: Secondary | ICD-10-CM | POA: Diagnosis present

## 2016-04-17 DIAGNOSIS — E871 Hypo-osmolality and hyponatremia: Secondary | ICD-10-CM | POA: Diagnosis present

## 2016-04-17 DIAGNOSIS — Z888 Allergy status to other drugs, medicaments and biological substances status: Secondary | ICD-10-CM

## 2016-04-17 DIAGNOSIS — F5104 Psychophysiologic insomnia: Secondary | ICD-10-CM | POA: Diagnosis present

## 2016-04-17 DIAGNOSIS — K567 Ileus, unspecified: Secondary | ICD-10-CM | POA: Diagnosis not present

## 2016-04-17 DIAGNOSIS — I10 Essential (primary) hypertension: Secondary | ICD-10-CM | POA: Diagnosis present

## 2016-04-17 DIAGNOSIS — M179 Osteoarthritis of knee, unspecified: Secondary | ICD-10-CM | POA: Diagnosis present

## 2016-04-17 HISTORY — PX: LAPAROTOMY: SHX154

## 2016-04-17 LAB — COMPREHENSIVE METABOLIC PANEL
ALBUMIN: 3.3 g/dL — AB (ref 3.5–5.0)
ALT: 88 U/L — ABNORMAL HIGH (ref 14–54)
ANION GAP: 12 (ref 5–15)
AST: 41 U/L (ref 15–41)
Alkaline Phosphatase: 74 U/L (ref 38–126)
BILIRUBIN TOTAL: 1.1 mg/dL (ref 0.3–1.2)
BUN: 14 mg/dL (ref 6–20)
CALCIUM: 9.6 mg/dL (ref 8.9–10.3)
CO2: 29 mmol/L (ref 22–32)
Chloride: 89 mmol/L — ABNORMAL LOW (ref 101–111)
Creatinine, Ser: 1.28 mg/dL — ABNORMAL HIGH (ref 0.44–1.00)
GFR calc Af Amer: 52 mL/min — ABNORMAL LOW (ref >60)
GFR, EST NON AFRICAN AMERICAN: 45 mL/min — AB (ref >60)
GLUCOSE: 158 mg/dL — AB (ref 65–99)
POTASSIUM: 3.6 mmol/L (ref 3.5–5.1)
Sodium: 130 mmol/L — ABNORMAL LOW (ref 135–145)
TOTAL PROTEIN: 6.7 g/dL (ref 6.5–8.1)

## 2016-04-17 LAB — CBC
HEMATOCRIT: 42.6 % (ref 36.0–46.0)
Hemoglobin: 14.5 g/dL (ref 12.0–15.0)
MCH: 29.2 pg (ref 26.0–34.0)
MCHC: 34 g/dL (ref 30.0–36.0)
MCV: 85.7 fL (ref 78.0–100.0)
Platelets: 269 10*3/uL (ref 150–400)
RBC: 4.97 MIL/uL (ref 3.87–5.11)
RDW: 12.7 % (ref 11.5–15.5)
WBC: 21.5 10*3/uL — ABNORMAL HIGH (ref 4.0–10.5)

## 2016-04-17 LAB — I-STAT CG4 LACTIC ACID, ED: LACTIC ACID, VENOUS: 1.82 mmol/L (ref 0.5–1.9)

## 2016-04-17 LAB — LIPASE, BLOOD: Lipase: 76 U/L — ABNORMAL HIGH (ref 11–51)

## 2016-04-17 SURGERY — LAPAROTOMY, EXPLORATORY
Anesthesia: General | Site: Abdomen

## 2016-04-17 MED ORDER — IOPAMIDOL (ISOVUE-300) INJECTION 61%
INTRAVENOUS | Status: AC
  Administered 2016-04-17: 100 mL
  Filled 2016-04-17: qty 100

## 2016-04-17 MED ORDER — ONDANSETRON HCL 4 MG/2ML IJ SOLN
4.0000 mg | Freq: Once | INTRAMUSCULAR | Status: AC
  Administered 2016-04-17: 4 mg via INTRAVENOUS

## 2016-04-17 MED ORDER — MIDAZOLAM HCL 2 MG/2ML IJ SOLN
INTRAMUSCULAR | Status: AC
  Filled 2016-04-17: qty 2

## 2016-04-17 MED ORDER — SUFENTANIL CITRATE 50 MCG/ML IV SOLN
INTRAVENOUS | Status: AC
  Filled 2016-04-17: qty 1

## 2016-04-17 MED ORDER — CEFAZOLIN SODIUM-DEXTROSE 2-4 GM/100ML-% IV SOLN
2.0000 g | Freq: Once | INTRAVENOUS | Status: AC
  Administered 2016-04-18: 2 g via INTRAVENOUS
  Filled 2016-04-17: qty 100

## 2016-04-17 MED ORDER — ONDANSETRON HCL 4 MG/2ML IJ SOLN
4.0000 mg | Freq: Once | INTRAMUSCULAR | Status: DC
  Filled 2016-04-17: qty 2

## 2016-04-17 MED ORDER — ONDANSETRON HCL 4 MG/2ML IJ SOLN
INTRAMUSCULAR | Status: AC
  Filled 2016-04-17: qty 2

## 2016-04-17 MED ORDER — SODIUM CHLORIDE 0.9 % IJ SOLN
INTRAMUSCULAR | Status: AC
  Filled 2016-04-17: qty 10

## 2016-04-17 MED ORDER — GI COCKTAIL ~~LOC~~
30.0000 mL | Freq: Once | ORAL | Status: DC
  Filled 2016-04-17: qty 30

## 2016-04-17 MED ORDER — SODIUM CHLORIDE 0.9 % IV BOLUS (SEPSIS)
1000.0000 mL | Freq: Once | INTRAVENOUS | Status: AC
  Administered 2016-04-17: 1000 mL via INTRAVENOUS

## 2016-04-17 MED ORDER — SUCCINYLCHOLINE CHLORIDE 200 MG/10ML IV SOSY
PREFILLED_SYRINGE | INTRAVENOUS | Status: AC
  Filled 2016-04-17: qty 10

## 2016-04-17 MED ORDER — ROCURONIUM BROMIDE 50 MG/5ML IV SOSY
PREFILLED_SYRINGE | INTRAVENOUS | Status: AC
  Filled 2016-04-17: qty 5

## 2016-04-17 MED ORDER — LACTATED RINGERS IV SOLN
INTRAVENOUS | Status: DC | PRN
  Administered 2016-04-17 – 2016-04-18 (×2): via INTRAVENOUS

## 2016-04-17 MED ORDER — SODIUM CHLORIDE 0.9 % IV BOLUS (SEPSIS): 500.0000 mL | Freq: Once | INTRAVENOUS | Status: DC

## 2016-04-17 MED ORDER — LIDOCAINE 2% (20 MG/ML) 5 ML SYRINGE
INTRAMUSCULAR | Status: AC
  Filled 2016-04-17: qty 5

## 2016-04-17 SURGICAL SUPPLY — 42 items
BLADE SURG ROTATE 9660 (MISCELLANEOUS) IMPLANT
CANISTER SUCTION 2500CC (MISCELLANEOUS) ×2 IMPLANT
CANISTER WOUND CARE 500ML ATS (WOUND CARE) ×2 IMPLANT
CHLORAPREP W/TINT 26ML (MISCELLANEOUS) ×2 IMPLANT
COVER SURGICAL LIGHT HANDLE (MISCELLANEOUS) ×2 IMPLANT
DRAPE LAPAROSCOPIC ABDOMINAL (DRAPES) ×2 IMPLANT
DRAPE WARM FLUID 44X44 (DRAPE) ×2 IMPLANT
DRSG OPSITE POSTOP 4X10 (GAUZE/BANDAGES/DRESSINGS) IMPLANT
DRSG OPSITE POSTOP 4X8 (GAUZE/BANDAGES/DRESSINGS) IMPLANT
DRSG VAC ATS MED SENSATRAC (GAUZE/BANDAGES/DRESSINGS) ×2 IMPLANT
ELECT BLADE 6.5 EXT (BLADE) ×2 IMPLANT
ELECT CAUTERY BLADE 6.4 (BLADE) IMPLANT
ELECT REM PT RETURN 9FT ADLT (ELECTROSURGICAL) ×2
ELECTRODE REM PT RTRN 9FT ADLT (ELECTROSURGICAL) ×1 IMPLANT
GLOVE BIO SURGEON STRL SZ7 (GLOVE) ×2 IMPLANT
GLOVE BIOGEL PI IND STRL 7.5 (GLOVE) ×1 IMPLANT
GLOVE BIOGEL PI INDICATOR 7.5 (GLOVE) ×1
GOWN STRL REUS W/ TWL LRG LVL3 (GOWN DISPOSABLE) ×2 IMPLANT
GOWN STRL REUS W/TWL LRG LVL3 (GOWN DISPOSABLE) ×2
KIT BASIN OR (CUSTOM PROCEDURE TRAY) ×2 IMPLANT
KIT ROOM TURNOVER OR (KITS) ×2 IMPLANT
LIGASURE IMPACT 36 18CM CVD LR (INSTRUMENTS) IMPLANT
NS IRRIG 1000ML POUR BTL (IV SOLUTION) ×4 IMPLANT
PACK GENERAL/GYN (CUSTOM PROCEDURE TRAY) ×2 IMPLANT
PAD ARMBOARD 7.5X6 YLW CONV (MISCELLANEOUS) ×2 IMPLANT
SPECIMEN JAR LARGE (MISCELLANEOUS) IMPLANT
SPONGE LAP 18X18 X RAY DECT (DISPOSABLE) IMPLANT
STAPLER VISISTAT 35W (STAPLE) ×2 IMPLANT
SUCTION POOLE TIP (SUCTIONS) ×2 IMPLANT
SUT NOVA 1 T20/GS 25DT (SUTURE) ×8 IMPLANT
SUT PDS AB 1 TP1 96 (SUTURE) ×4 IMPLANT
SUT SILK 2 0 (SUTURE) ×1
SUT SILK 2 0 SH CR/8 (SUTURE) ×2 IMPLANT
SUT SILK 2-0 18XBRD TIE 12 (SUTURE) ×1 IMPLANT
SUT SILK 3 0 (SUTURE) ×1
SUT SILK 3 0 SH CR/8 (SUTURE) ×2 IMPLANT
SUT SILK 3-0 18XBRD TIE 12 (SUTURE) ×1 IMPLANT
SUT VIC AB 3-0 SH 27 (SUTURE)
SUT VIC AB 3-0 SH 27X BRD (SUTURE) IMPLANT
TOWEL OR 17X26 10 PK STRL BLUE (TOWEL DISPOSABLE) ×2 IMPLANT
TRAY FOLEY CATH 16FRSI W/METER (SET/KITS/TRAYS/PACK) ×2 IMPLANT
YANKAUER SUCT BULB TIP NO VENT (SUCTIONS) ×2 IMPLANT
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
## 2016-04-17 NOTE — ED Triage Notes (Signed)
Pt c/o vomiting after having gall bladder removed last wednesday

## 2016-04-17 NOTE — ED Provider Notes (Signed)
[facility] DEPT Provider Note   CSN: [id number] Arrival date & time: 04/17/16  1736     History   Chief Complaint Chief Complaint  Patient presents with  . Emesis    HPI Crystal Waters is a 61 y.o. female.  61 year old female with a history of asthma, hypertension, esophageal reflux, PTSD, and thyroid cancer presents to the emergency department for evaluation of abdominal pain. She is 6 days status post laparoscopic cholecystectomy by Dr. Grandville Silos. She reports that she has had persistent nausea and vomiting since her surgery. She has had inability to tolerate food or fluids by mouth despite Reglan. She reports no bowel movements and only recent passing of flatus. She passed a small amount of flatus when providing a urine sample in triage. She states that she feels as though her upper abdomen has become more distended. She states that it would "make funny noises when I pushed on it". She denies any fevers. She has had minimal abdominal pain; this is mostly sporadic and present in the epigastric abdomen. No reports of urinary symptoms. No drainage from her surgical wound sites.   The history is provided by the patient. No language interpreter was used.  Emesis      Past Medical History:  Diagnosis Date  . Allergy    gluten, seasonal  . Anxiety   . Arthritis   . Asthma    seasonal   . Cancer ([place])    Thyroid  . Complication of anesthesia   . Constipation   . Depression   . GERD (gastroesophageal reflux disease)   . Gestational diabetes mellitus    with 1 child.    . H/O nose injury    accidently hit in the nose, has a stuffy nose, can't lie flat, wears nasal strips at night  . History of kidney stones      x 2 passed  . Hypertension   . Hypothyroidism   . PONV (postoperative nausea and vomiting)   . PTSD (post-traumatic stress disorder)   . Thyroid disease     Patient Active Problem List   Diagnosis Date Noted  . Left shoulder pain 10/03/2015  . Glucose  intolerance (impaired glucose tolerance) 02/07/2015  . Atypical nevi 10/29/2013  . OA (osteoarthritis) of knee 01/15/2013  . Obesity 01/15/2013  . Essential hypertension, benign 01/15/2013  . Hypothyroidism 01/15/2013  . Chronic insomnia 01/15/2013  . Major depressive disorder 01/15/2013  . GAD (generalized anxiety disorder) 01/15/2013    Past Surgical History:  Procedure Laterality Date  . CHOLECYSTECTOMY N/A 04/11/2016   Procedure: LAPAROSCOPIC CHOLECYSTECTOMY WITH INTRAOPERATIVE CHOLANGIOGRAM;  Surgeon: Georganna Skeans, MD;  Location: [facility];  Service: General;  Laterality: N/A;  . COLONOSCOPY    . KNEE SURGERY Right 2009  . THYROID SURGERY     2001  . TUBAL LIGATION      OB History    No data available       Home Medications    Prior to Admission medications   Medication Sig Start Date End Date Taking? Authorizing Provider  ALPRAZolam (XANAX) 0.25 MG tablet TAKE 1 TABLET BY MOUTH AT BEDTIME AS NEEDED SLEEP OR ANXIETY 10/14/15   Alycia Rossetti, MD  buPROPion (WELLBUTRIN XL) 300 MG 24 hr tablet TAKE 1 TABLET (300 MG TOTAL) BY MOUTH DAILY. 02/14/16   Alycia Rossetti, MD  Cholecalciferol (VITAMIN D) 2000 UNITS tablet Take 2,000 Units by mouth daily.    Historical Provider, MD  diclofenac sodium (VOLTAREN) 1 % GEL Apply to  affected areas four times a day as needed 02/21/16   Alycia Rossetti, MD  HYDROcodone-acetaminophen (NORCO/VICODIN) 5-325 MG tablet Take 1-2 tablets by mouth every 4 (four) hours as needed for moderate pain or severe pain. 04/11/16   Georganna Skeans, MD  ibuprofen (ADVIL,MOTRIN) 200 MG tablet Take 400 mg by mouth every 8 (eight) hours as needed for mild pain or moderate pain.    Historical Provider, MD  lisinopril (PRINIVIL,ZESTRIL) 10 MG tablet TAKE 1 TABLET (10 MG TOTAL) BY MOUTH DAILY. 02/14/16   Alycia Rossetti, MD  loratadine (CLARITIN) 10 MG tablet Take 10 mg by mouth daily.    Historical Provider, MD  ranitidine (ZANTAC) 150 MG tablet TAKE 1 TABLET BY  MOUTH TWICE A DAY Patient taking differently: TAKE 1 TABLET BY MOUTH TWICE A DAY AS NEEDED 09/23/15   Alycia Rossetti, MD  thyroid [facility] THYROID) 60 MG tablet Take 2.5 tablets (150 mg total) by mouth daily before breakfast. 01/26/16   Alycia Rossetti, MD  triamcinolone cream (KENALOG) 0.1 % Apply 1 application topically 2 (two) times daily. Patient taking differently: Apply 1 application topically 2 (two) times daily as needed.  07/06/15   Alycia Rossetti, MD    Family History Family History  Problem Relation Age of Onset  . Diabetes Father   . Heart disease Father   . Hyperlipidemia Father   . Hypertension Father   . Diabetes Brother   . Autism Son   . Cancer Paternal Grandmother     Social History Social History  Substance Use Topics  . Smoking status: Former Research scientist (life sciences)  . Smokeless tobacco: Never Used     Comment: social smoker- never a habit  . Alcohol use Yes     Comment: occasionally      Allergies   Gluten meal and Zoloft [sertraline hcl]   Review of Systems Review of Systems  Gastrointestinal: Positive for vomiting.  Ten systems reviewed and are negative for acute change, except as noted in the HPI.    Physical Exam Updated Vital Signs BP (!) 135/54   Pulse 88   Temp 98.4 F (36.9 C) (Oral)   Resp 17   SpO2 97%   Physical Exam  Constitutional: She is oriented to person, place, and time. She appears well-developed and well-nourished. No distress.  Patient pleasant and in NAD  HENT:  Head: Normocephalic and atraumatic.  Eyes: Conjunctivae and EOM are normal. No scleral icterus.  Neck: Normal range of motion.  Cardiovascular: Normal rate, regular rhythm and intact distal pulses.   Pulmonary/Chest: Effort normal. No respiratory distress.  Respirations even and unlabored  Abdominal: She exhibits distension. Bowel sounds are increased.  Hyperactive bowel sounds in the upper abdomen. Surgical incision sites from laparoscopic cholecystectomy are C/D/I. There  is ecchymosis associated with the RUQ incision site. There is nonblanching erythema associated with supraumbilical incision site. No significant heat to touch. Distention in bilateral upper quadrants. Bilateral lower quadrants are soft and nontender. No voluntary guarding.  Musculoskeletal: Normal range of motion.  Neurological: She is alert and oriented to person, place, and time. She exhibits normal muscle tone. Coordination normal.  GCS 15. Patient moving all extremities.  Skin: Skin is warm and dry. No rash noted. She is not diaphoretic. No erythema. No pallor.  Psychiatric: She has a normal mood and affect. Her behavior is normal.  Nursing note and vitals reviewed.    ED Treatments / Results  Labs (all labs ordered are listed, but only abnormal results  are displayed) Labs Reviewed  LIPASE, BLOOD - Abnormal; Notable for the following:       Result Value   Lipase 76 (*)    All other components within normal limits  COMPREHENSIVE METABOLIC PANEL - Abnormal; Notable for the following:    Sodium 130 (*)    Chloride 89 (*)    Glucose, Bld 158 (*)    Creatinine, Ser 1.28 (*)    Albumin 3.3 (*)    ALT 88 (*)    GFR calc non Af Amer 45 (*)    GFR calc Af Amer 52 (*)    All other components within normal limits  CBC - Abnormal; Notable for the following:    WBC 21.5 (*)    All other components within normal limits  URINALYSIS, ROUTINE W REFLEX MICROSCOPIC  I-STAT CG4 LACTIC ACID, ED    EKG  EKG Interpretation None       Radiology Ct Abdomen Pelvis W Contrast  Result Date: 04/17/2016 CLINICAL DATA:  Vomiting. Vomiting post cholecystectomy 6 days prior. EXAM: CT ABDOMEN AND PELVIS WITH CONTRAST TECHNIQUE: Multidetector CT imaging of the abdomen and pelvis was performed using the standard protocol following bolus administration of intravenous contrast. CONTRAST:  144mL ISOVUE-300 IOPAMIDOL (ISOVUE-300) INJECTION 61% COMPARISON:  Abdominal radiographs 02/22/2016 FINDINGS: Lower  chest: Mild dependent atelectasis. Fluid distends the distal esophagus. Hepatobiliary: Postcholecystectomy with clips in the gallbladder fossa. There is a 5.5 x 3.3 cm fluid collection within the gallbladder fossa with ill-defined borders. No internal air. Minimal pneumobilia noted in the high right hepatic lobe. No common bile duct dilatation. There is a 15 mm low-density lesion in the right hepatic lobe. 9 mm low-density lesion in the left lobe. Pancreas: No ductal dilatation or inflammation. Spleen: Normal in size without focal abnormality. Adrenals/Urinary Tract: No adrenal nodule. Relatively severe left hydronephrosis and hydroureter, ureter is dilated to the midportion where there is a 17 x 11 x 11 mm stone. The more distal ureter is decompressed. Two adjacent stones measuring 8 mm are identified in the left ureter approximately 4 cm proximally. This is likely chronic as there is marked thinning of the left renal parenchyma. Probable cortical cyst in the upper left kidney. Delayed phase imaging demonstrates absent excretion from the left kidney. There is no perinephric edema. Two nonobstructing stones in the lower right kidney measure 10 and 12 mm respectively. There is no right hydronephrosis. Urinary bladder is decompressed. Stomach/Bowel: Fluid distending the distal esophagus which is dilated. Stomach distended with fluid. Proximal small bowel bowel is dilated and fluid-filled. Within a dilated pelvic small bowel loops is a round 9 mm density which may be ingested material, however is nonspecific. Small bowel transition point of a supraumbilical ventral abdominal wall hernia which contains small bowel and mesenteric vessels. The exiting small bowel is decompressed. More distal small bowel is decompressed. There is no pneumatosis, definite small bowel thickening or mesenteric edema. Normal appendix. Stool throughout the colon without colonic inflammation or distention. Vascular/Lymphatic: Atherosclerosis  without aneurysm. No adenopathy. Reproductive: Uterus and bilateral adnexa are unremarkable. Other: No intra-abdominal ascites. Other than gallbladder fossa fluid, no intra- abdominal fluid collection. No pneumoperitoneum. Midline ventral abdominal wall hernia neck measures 4.0 x 2.9 cm and represents the site of small bowel obstruction. This appears just superior to the umbilicus. Edema within the right lateral abdominal wall at likely site of port site, no subcutaneous abscess. Musculoskeletal: There are no acute or suspicious osseous abnormalities. Scattered bone islands in the pelvis. Degenerative change  throughout spine. IMPRESSION: 1. Small bowel obstruction secondary to a midline supraumbilical ventral abdominal wall hernia. No evidence of small bowel inflammation or perforation. 2. Small fluid collection in the gallbladder fossa measures 5.5 x 3.3 cm, may be postoperative seroma. Superimposed infection or biloma not excluded. 3. Minimal pneumobilia is likely postsurgical. 4. Left hydroureteronephrosis secondary to a large 17 mm stone in the mid left ureter, most certainly chronic. Chronicity suggested by marked renal parenchymal atrophy and lack of perinephric edema. There are additional stones within the dilated left ureter that are nonobstructing. Nonobstructing right renal stones without right hydronephrosis. 5. Aortic atherosclerosis. Electronically Signed   By: Jeb Levering M.D.   On: 04/17/2016 22:16    Procedures Procedures (including critical care time)  Medications Ordered in ED Medications  ondansetron [facility]) injection 4 mg (4 mg Intravenous Given 04/17/16 2113)  sodium chloride 0.9 % bolus 1,000 mL (1,000 mLs Intravenous New Bag/Given 04/17/16 2118)  iopamidol (ISOVUE-300) 61 % injection (100 mLs  Contrast Given 04/17/16 2141)     Initial Impression / Assessment and Plan / ED Course  I have reviewed the triage vital signs and the nursing notes.  Pertinent labs & imaging results  that were available during my care of the patient were reviewed by me and considered in my medical decision making (see chart for details).     Patient presenting for N/V after surgery. Hx of lap chole on 04/13/16. CCS to take to OR for surgical management. VSS.  Vitals:   04/17/16 1745 04/17/16 2059 04/17/16 2100 04/17/16 2130  BP: 102/64 130/63 126/68 (!) 135/54  Pulse: 110 92 89 88  Resp: 18 21 21 17   Temp: 98.5 F (36.9 C) 98.4 F (36.9 C)    TempSrc: Oral Oral    SpO2: 97% 98% 96% 97%    Final Clinical Impressions(s) / ED Diagnoses   Final diagnoses:  SBO (small bowel obstruction)    New Prescriptions New Prescriptions   No medications on file     Antonietta Breach, PA-C 04/17/16 [place], MD 04/18/16 [phone]

## 2016-04-17 NOTE — ED Notes (Signed)
Patient transported to CT 

## 2016-04-17 NOTE — ED Notes (Signed)
Personal belongings and valuables sent home with pt's brother in law.

## 2016-04-18 ENCOUNTER — Emergency Department (HOSPITAL_COMMUNITY): Payer: BLUE CROSS/BLUE SHIELD | Admitting: Anesthesiology

## 2016-04-18 ENCOUNTER — Inpatient Hospital Stay (HOSPITAL_COMMUNITY): Payer: BLUE CROSS/BLUE SHIELD

## 2016-04-18 ENCOUNTER — Encounter (HOSPITAL_COMMUNITY): Payer: Self-pay | Admitting: General Surgery

## 2016-04-18 DIAGNOSIS — Z91018 Allergy to other foods: Secondary | ICD-10-CM | POA: Diagnosis not present

## 2016-04-18 DIAGNOSIS — J45909 Unspecified asthma, uncomplicated: Secondary | ICD-10-CM | POA: Diagnosis present

## 2016-04-18 DIAGNOSIS — E669 Obesity, unspecified: Secondary | ICD-10-CM | POA: Diagnosis present

## 2016-04-18 DIAGNOSIS — L0390 Cellulitis, unspecified: Secondary | ICD-10-CM | POA: Diagnosis not present

## 2016-04-18 DIAGNOSIS — K430 Incisional hernia with obstruction, without gangrene: Secondary | ICD-10-CM | POA: Diagnosis present

## 2016-04-18 DIAGNOSIS — E871 Hypo-osmolality and hyponatremia: Secondary | ICD-10-CM | POA: Diagnosis present

## 2016-04-18 DIAGNOSIS — K56609 Unspecified intestinal obstruction, unspecified as to partial versus complete obstruction: Secondary | ICD-10-CM | POA: Diagnosis present

## 2016-04-18 DIAGNOSIS — E876 Hypokalemia: Secondary | ICD-10-CM | POA: Diagnosis present

## 2016-04-18 DIAGNOSIS — F329 Major depressive disorder, single episode, unspecified: Secondary | ICD-10-CM | POA: Diagnosis present

## 2016-04-18 DIAGNOSIS — K219 Gastro-esophageal reflux disease without esophagitis: Secondary | ICD-10-CM | POA: Diagnosis present

## 2016-04-18 DIAGNOSIS — Z87891 Personal history of nicotine dependence: Secondary | ICD-10-CM | POA: Diagnosis not present

## 2016-04-18 DIAGNOSIS — I10 Essential (primary) hypertension: Secondary | ICD-10-CM | POA: Diagnosis present

## 2016-04-18 DIAGNOSIS — Z8249 Family history of ischemic heart disease and other diseases of the circulatory system: Secondary | ICD-10-CM | POA: Diagnosis not present

## 2016-04-18 DIAGNOSIS — I700 Atherosclerosis of aorta: Secondary | ICD-10-CM | POA: Diagnosis present

## 2016-04-18 DIAGNOSIS — E039 Hypothyroidism, unspecified: Secondary | ICD-10-CM | POA: Diagnosis present

## 2016-04-18 DIAGNOSIS — M179 Osteoarthritis of knee, unspecified: Secondary | ICD-10-CM | POA: Diagnosis present

## 2016-04-18 DIAGNOSIS — E119 Type 2 diabetes mellitus without complications: Secondary | ICD-10-CM | POA: Diagnosis present

## 2016-04-18 DIAGNOSIS — K567 Ileus, unspecified: Secondary | ICD-10-CM | POA: Diagnosis not present

## 2016-04-18 DIAGNOSIS — Z888 Allergy status to other drugs, medicaments and biological substances status: Secondary | ICD-10-CM | POA: Diagnosis not present

## 2016-04-18 DIAGNOSIS — F5104 Psychophysiologic insomnia: Secondary | ICD-10-CM | POA: Diagnosis present

## 2016-04-18 DIAGNOSIS — F4310 Post-traumatic stress disorder, unspecified: Secondary | ICD-10-CM | POA: Diagnosis present

## 2016-04-18 LAB — URINALYSIS, ROUTINE W REFLEX MICROSCOPIC
BILIRUBIN URINE: NEGATIVE
Glucose, UA: NEGATIVE mg/dL
Hgb urine dipstick: NEGATIVE
KETONES UR: NEGATIVE mg/dL
Leukocytes, UA: NEGATIVE
Nitrite: NEGATIVE
Protein, ur: 30 mg/dL — AB
SPECIFIC GRAVITY, URINE: 1.03 (ref 1.005–1.030)
SQUAMOUS EPITHELIAL / LPF: NONE SEEN
pH: 6 (ref 5.0–8.0)

## 2016-04-18 LAB — BASIC METABOLIC PANEL
Anion gap: 10 (ref 5–15)
BUN: 14 mg/dL (ref 6–20)
CALCIUM: 8.4 mg/dL — AB (ref 8.9–10.3)
CO2: 28 mmol/L (ref 22–32)
CREATININE: 1.09 mg/dL — AB (ref 0.44–1.00)
Chloride: 92 mmol/L — ABNORMAL LOW (ref 101–111)
GFR calc non Af Amer: 54 mL/min — ABNORMAL LOW (ref >60)
Glucose, Bld: 130 mg/dL — ABNORMAL HIGH (ref 65–99)
Potassium: 3.4 mmol/L — ABNORMAL LOW (ref 3.5–5.1)
SODIUM: 130 mmol/L — AB (ref 135–145)

## 2016-04-18 LAB — CBC
HCT: 34.1 % — ABNORMAL LOW (ref 36.0–46.0)
Hemoglobin: 11.5 g/dL — ABNORMAL LOW (ref 12.0–15.0)
MCH: 29 pg (ref 26.0–34.0)
MCHC: 33.7 g/dL (ref 30.0–36.0)
MCV: 85.9 fL (ref 78.0–100.0)
PLATELETS: 190 10*3/uL (ref 150–400)
RBC: 3.97 MIL/uL (ref 3.87–5.11)
RDW: 13.3 % (ref 11.5–15.5)
WBC: 13.9 10*3/uL — AB (ref 4.0–10.5)

## 2016-04-18 LAB — GLUCOSE, CAPILLARY: Glucose-Capillary: 128 mg/dL — ABNORMAL HIGH (ref 65–99)

## 2016-04-18 MED ORDER — SCOPOLAMINE 1 MG/3DAYS TD PT72
MEDICATED_PATCH | TRANSDERMAL | Status: DC | PRN
  Administered 2016-04-18: 1 via TRANSDERMAL

## 2016-04-18 MED ORDER — 0.9 % SODIUM CHLORIDE (POUR BTL) OPTIME
TOPICAL | Status: DC | PRN
  Administered 2016-04-18 (×2): 1000 mL

## 2016-04-18 MED ORDER — SUFENTANIL CITRATE 50 MCG/ML IV SOLN
INTRAVENOUS | Status: DC | PRN
  Administered 2016-04-18: 10 ug via INTRAVENOUS
  Administered 2016-04-18: 5 ug via INTRAVENOUS
  Administered 2016-04-18: 15 ug via INTRAVENOUS
  Administered 2016-04-18: 5 ug via INTRAVENOUS
  Administered 2016-04-18: 10 ug via INTRAVENOUS
  Administered 2016-04-18: 5 ug via INTRAVENOUS

## 2016-04-18 MED ORDER — HYDROMORPHONE HCL 1 MG/ML IJ SOLN
INTRAMUSCULAR | Status: AC
  Filled 2016-04-18: qty 0.5

## 2016-04-18 MED ORDER — PHENOL 1.4 % MT LIQD: 1.0000 | OROMUCOSAL | Status: DC | PRN

## 2016-04-18 MED ORDER — ONDANSETRON HCL 4 MG/2ML IJ SOLN
INTRAMUSCULAR | Status: DC | PRN
  Administered 2016-04-18: 4 mg via INTRAVENOUS

## 2016-04-18 MED ORDER — ROCURONIUM BROMIDE 50 MG/5ML IV SOSY
PREFILLED_SYRINGE | INTRAVENOUS | Status: AC
  Filled 2016-04-18: qty 5

## 2016-04-18 MED ORDER — PROPOFOL 10 MG/ML IV BOLUS
INTRAVENOUS | Status: DC | PRN
  Administered 2016-04-18: 120 mg via INTRAVENOUS

## 2016-04-18 MED ORDER — SUGAMMADEX SODIUM 200 MG/2ML IV SOLN
INTRAVENOUS | Status: AC
  Filled 2016-04-18: qty 4

## 2016-04-18 MED ORDER — ACETAMINOPHEN 325 MG PO TABS
650.0000 mg | ORAL_TABLET | Freq: Four times a day (QID) | ORAL | Status: DC | PRN
  Administered 2016-04-20 – 2016-04-21 (×5): 650 mg via ORAL
  Filled 2016-04-18 (×6): qty 2

## 2016-04-18 MED ORDER — SUGAMMADEX SODIUM 500 MG/5ML IV SOLN
INTRAVENOUS | Status: DC | PRN
  Administered 2016-04-18: 250 mg via INTRAVENOUS

## 2016-04-18 MED ORDER — ACETAMINOPHEN 650 MG RE SUPP: 650.0000 mg | Freq: Four times a day (QID) | RECTAL | Status: DC | PRN

## 2016-04-18 MED ORDER — DIPHENHYDRAMINE HCL 50 MG/ML IJ SOLN
12.5000 mg | Freq: Four times a day (QID) | INTRAMUSCULAR | Status: DC | PRN
  Filled 2016-04-18: qty 0.25

## 2016-04-18 MED ORDER — PHENYLEPHRINE 40 MCG/ML (10ML) SYRINGE FOR IV PUSH (FOR BLOOD PRESSURE SUPPORT)
PREFILLED_SYRINGE | INTRAVENOUS | Status: AC
  Filled 2016-04-18: qty 10

## 2016-04-18 MED ORDER — SODIUM CHLORIDE 0.9 % IV SOLN
INTRAVENOUS | Status: DC
  Administered 2016-04-18: 03:00:00 via INTRAVENOUS

## 2016-04-18 MED ORDER — POTASSIUM CHLORIDE 2 MEQ/ML IV SOLN
INTRAVENOUS | Status: DC
  Administered 2016-04-19 – 2016-04-21 (×2): via INTRAVENOUS
  Filled 2016-04-18 (×7): qty 1000

## 2016-04-18 MED ORDER — DEXAMETHASONE SODIUM PHOSPHATE 10 MG/ML IJ SOLN
INTRAMUSCULAR | Status: AC
  Filled 2016-04-18: qty 1

## 2016-04-18 MED ORDER — PANTOPRAZOLE SODIUM 40 MG IV SOLR
40.0000 mg | Freq: Every day | INTRAVENOUS | Status: DC
  Administered 2016-04-18 – 2016-04-20 (×3): 40 mg via INTRAVENOUS
  Filled 2016-04-18 (×3): qty 40

## 2016-04-18 MED ORDER — HYDROMORPHONE HCL 1 MG/ML IJ SOLN
0.2500 mg | INTRAMUSCULAR | Status: DC | PRN
  Administered 2016-04-18 (×3): 0.5 mg via INTRAVENOUS

## 2016-04-18 MED ORDER — ONDANSETRON HCL 4 MG/2ML IJ SOLN: 4.0000 mg | Freq: Four times a day (QID) | INTRAMUSCULAR | Status: DC | PRN

## 2016-04-18 MED ORDER — SUCCINYLCHOLINE CHLORIDE 20 MG/ML IJ SOLN
INTRAMUSCULAR | Status: DC | PRN
  Administered 2016-04-18: 120 mg via INTRAVENOUS

## 2016-04-18 MED ORDER — METHOCARBAMOL 1000 MG/10ML IJ SOLN
500.0000 mg | Freq: Three times a day (TID) | INTRAVENOUS | Status: DC | PRN
  Administered 2016-04-20 – 2016-04-21 (×2): 500 mg via INTRAVENOUS
  Filled 2016-04-18 (×2): qty 5

## 2016-04-18 MED ORDER — LIDOCAINE HCL (CARDIAC) 20 MG/ML IV SOLN
INTRAVENOUS | Status: DC | PRN
  Administered 2016-04-18: 60 mg via INTRATRACHEAL

## 2016-04-18 MED ORDER — ENOXAPARIN SODIUM 40 MG/0.4ML ~~LOC~~ SOLN
40.0000 mg | SUBCUTANEOUS | Status: DC
  Administered 2016-04-18 – 2016-04-21 (×4): 40 mg via SUBCUTANEOUS
  Filled 2016-04-18 (×4): qty 0.4

## 2016-04-18 MED ORDER — SCOPOLAMINE 1 MG/3DAYS TD PT72
MEDICATED_PATCH | TRANSDERMAL | Status: AC
  Filled 2016-04-18: qty 1

## 2016-04-18 MED ORDER — NALOXONE HCL 0.4 MG/ML IJ SOLN
0.4000 mg | INTRAMUSCULAR | Status: DC | PRN
  Filled 2016-04-18: qty 1

## 2016-04-18 MED ORDER — ALBUMIN HUMAN 5 % IV SOLN
INTRAVENOUS | Status: DC | PRN
  Administered 2016-04-18: 01:00:00 via INTRAVENOUS

## 2016-04-18 MED ORDER — SODIUM CHLORIDE 0.9% FLUSH: 9.0000 mL | INTRAVENOUS | Status: DC | PRN

## 2016-04-18 MED ORDER — MORPHINE SULFATE 2 MG/ML IV SOLN
INTRAVENOUS | Status: DC
  Administered 2016-04-18: 6 mg via INTRAVENOUS
  Administered 2016-04-18: 5 mg via INTRAVENOUS
  Administered 2016-04-18: 02:00:00 via INTRAVENOUS
  Administered 2016-04-18: 11 mg via INTRAVENOUS
  Administered 2016-04-18: 6 mg via INTRAVENOUS
  Administered 2016-04-18: 3 mg via INTRAVENOUS
  Administered 2016-04-19: 8 mg via INTRAVENOUS
  Administered 2016-04-19: 7 mg via INTRAVENOUS
  Administered 2016-04-19: 3 mg via INTRAVENOUS
  Filled 2016-04-18 (×3): qty 25

## 2016-04-18 MED ORDER — SODIUM CHLORIDE 0.9 % IV SOLN
30.0000 meq | Freq: Once | INTRAVENOUS | Status: DC
  Filled 2016-04-18: qty 15

## 2016-04-18 MED ORDER — ONDANSETRON 4 MG PO TBDP
4.0000 mg | ORAL_TABLET | Freq: Four times a day (QID) | ORAL | Status: DC | PRN
  Filled 2016-04-18: qty 1

## 2016-04-18 MED ORDER — ONDANSETRON HCL 4 MG/2ML IJ SOLN
4.0000 mg | Freq: Four times a day (QID) | INTRAMUSCULAR | Status: DC | PRN
  Administered 2016-04-21: 4 mg via INTRAVENOUS
  Filled 2016-04-18 (×2): qty 2

## 2016-04-18 MED ORDER — DIPHENHYDRAMINE HCL 12.5 MG/5ML PO ELIX
12.5000 mg | ORAL_SOLUTION | Freq: Four times a day (QID) | ORAL | Status: DC | PRN
  Filled 2016-04-18: qty 5

## 2016-04-18 MED ORDER — MIDAZOLAM HCL 2 MG/2ML IJ SOLN
INTRAMUSCULAR | Status: DC | PRN
  Administered 2016-04-18: 2 mg via INTRAVENOUS

## 2016-04-18 MED ORDER — ROCURONIUM BROMIDE 100 MG/10ML IV SOLN
INTRAVENOUS | Status: DC | PRN
  Administered 2016-04-18: 10 mg via INTRAVENOUS
  Administered 2016-04-18: 50 mg via INTRAVENOUS

## 2016-04-18 MED ORDER — DEXTROSE 5 % IV SOLN
2.0000 g | INTRAVENOUS | Status: DC
  Administered 2016-04-19: 2 g via INTRAVENOUS
  Filled 2016-04-18 (×3): qty 2

## 2016-04-18 MED ORDER — DEXAMETHASONE SODIUM PHOSPHATE 10 MG/ML IJ SOLN
INTRAMUSCULAR | Status: DC | PRN
  Administered 2016-04-18: 10 mg via INTRAVENOUS

## 2016-04-18 NOTE — Progress Notes (Addendum)
1 Day Post-Op  Subjective: Good pain control, no flatus  Objective: Vital signs in last 24 hours: Temp:  [98.1 F (36.7 C)-98.5 F (36.9 C)] 98.2 F (36.8 C) (01/31 0257) Pulse Rate:  [64-110] 64 (01/31 0257) Resp:  [14-24] 20 (01/31 0400) BP: (102-147)/(54-68) 134/55 (01/31 0257) SpO2:  [91 %-98 %] 93 % (01/31 0400) Last BM Date: 04/09/16  Intake/Output from previous day: 01/30 0701 - 01/31 0700 In: 2300 [I.V.:1800; IV Piggyback:500] Out: 345 [Urine:30; Emesis/NG output:300; Blood:15] Intake/Output this shift: No intake/output data recorded.  General appearance: alert and cooperative Resp: clear to auscultation bilaterally Cardio: regular rate and rhythm GI: soft, VAC on wound, erythema upper L lateral, quiet  Lab Results:   Recent Labs  04/17/16 1756 04/18/16 0542  WBC 21.5* PENDING  HGB 14.5 11.5*  HCT 42.6 34.1*  PLT 269 190   BMET  Recent Labs  04/17/16 1756 04/18/16 0542  NA 130* 130*  K 3.6 3.4*  CL 89* 92*  CO2 29 28  GLUCOSE 158* 130*  BUN 14 14  CREATININE 1.28* 1.09*  CALCIUM 9.6 8.4*   PT/INR No results for input(s): LABPROT, INR in the last 72 hours. ABG No results for input(s): PHART, HCO3 in the last 72 hours.  Invalid input(s): PCO2, PO2  Studies/Results: Ct Abdomen Pelvis W Contrast  Result Date: 04/17/2016 CLINICAL DATA:  Vomiting. Vomiting post cholecystectomy 6 days prior. EXAM: CT ABDOMEN AND PELVIS WITH CONTRAST TECHNIQUE: Multidetector CT imaging of the abdomen and pelvis was performed using the standard protocol following bolus administration of intravenous contrast. CONTRAST:  137mL ISOVUE-300 IOPAMIDOL (ISOVUE-300) INJECTION 61% COMPARISON:  Abdominal radiographs 02/22/2016 FINDINGS: Lower chest: Mild dependent atelectasis. Fluid distends the distal esophagus. Hepatobiliary: Postcholecystectomy with clips in the gallbladder fossa. There is a 5.5 x 3.3 cm fluid collection within the gallbladder fossa with ill-defined borders. No  internal air. Minimal pneumobilia noted in the high right hepatic lobe. No common bile duct dilatation. There is a 15 mm low-density lesion in the right hepatic lobe. 9 mm low-density lesion in the left lobe. Pancreas: No ductal dilatation or inflammation. Spleen: Normal in size without focal abnormality. Adrenals/Urinary Tract: No adrenal nodule. Relatively severe left hydronephrosis and hydroureter, ureter is dilated to the midportion where there is a 17 x 11 x 11 mm stone. The more distal ureter is decompressed. Two adjacent stones measuring 8 mm are identified in the left ureter approximately 4 cm proximally. This is likely chronic as there is marked thinning of the left renal parenchyma. Probable cortical cyst in the upper left kidney. Delayed phase imaging demonstrates absent excretion from the left kidney. There is no perinephric edema. Two nonobstructing stones in the lower right kidney measure 10 and 12 mm respectively. There is no right hydronephrosis. Urinary bladder is decompressed. Stomach/Bowel: Fluid distending the distal esophagus which is dilated. Stomach distended with fluid. Proximal small bowel bowel is dilated and fluid-filled. Within a dilated pelvic small bowel loops is a round 9 mm density which may be ingested material, however is nonspecific. Small bowel transition point of a supraumbilical ventral abdominal wall hernia which contains small bowel and mesenteric vessels. The exiting small bowel is decompressed. More distal small bowel is decompressed. There is no pneumatosis, definite small bowel thickening or mesenteric edema. Normal appendix. Stool throughout the colon without colonic inflammation or distention. Vascular/Lymphatic: Atherosclerosis without aneurysm. No adenopathy. Reproductive: Uterus and bilateral adnexa are unremarkable. Other: No intra-abdominal ascites. Other than gallbladder fossa fluid, no intra- abdominal fluid collection. No  pneumoperitoneum. Midline ventral  abdominal wall hernia neck measures 4.0 x 2.9 cm and represents the site of small bowel obstruction. This appears just superior to the umbilicus. Edema within the right lateral abdominal wall at likely site of port site, no subcutaneous abscess. Musculoskeletal: There are no acute or suspicious osseous abnormalities. Scattered bone islands in the pelvis. Degenerative change throughout spine. IMPRESSION: 1. Small bowel obstruction secondary to a midline supraumbilical ventral abdominal wall hernia. No evidence of small bowel inflammation or perforation. 2. Small fluid collection in the gallbladder fossa measures 5.5 x 3.3 cm, may be postoperative seroma. Superimposed infection or biloma not excluded. 3. Minimal pneumobilia is likely postsurgical. 4. Left hydroureteronephrosis secondary to a large 17 mm stone in the mid left ureter, most certainly chronic. Chronicity suggested by marked renal parenchymal atrophy and lack of perinephric edema. There are additional stones within the dilated left ureter that are nonobstructing. Nonobstructing right renal stones without right hydronephrosis. 5. Aortic atherosclerosis. Electronically Signed   By: Jeb Levering M.D.   On: 04/17/2016 22:16   Dg Abd Portable 1v  Result Date: 04/18/2016 CLINICAL DATA:  Nasogastric tube placement EXAM: PORTABLE ABDOMEN - 1 VIEW COMPARISON:  04/17/2016 FINDINGS: The nasogastric tube extends well into the stomach with tip in the fundus. Moderate small bowel dilatation persists. IMPRESSION: NG tube extends well into the stomach. Electronically Signed   By: Andreas Newport M.D.   On: 04/18/2016 01:57    Anti-infectives: Anti-infectives    Start     Dose/Rate Route Frequency Ordered Stop   04/18/16 0800  cefTRIAXone (ROCEPHIN) 2 g in dextrose 5 % 50 mL IVPB     2 g 100 mL/hr over 30 Minutes Intravenous Every 24 hours 04/18/16 0750     04/17/16 2345  ceFAZolin (ANCEF) IVPB 2g/100 mL premix     2 g 200 mL/hr over 30 Minutes  Intravenous  Once 04/17/16 2334 04/18/16 0015      Assessment/Plan: s/p Procedure(s): LAPAROTOMY REPAIR OF INCARCERATED INCISIONAL HERNIA WITH VAC PLACEMENT (N/A) POD#1 Expected ileus - NGT ID - Rocephin for wound cellulitis Hypokalemia - replace, change IVF Hyponatremia - F/U VTE - Lovenox. PAS Dispo - PT/OT    LOS: 0 days    Jashanti Clinkscale E 04/18/2016

## 2016-04-18 NOTE — Anesthesia Preprocedure Evaluation (Addendum)
Anesthesia Evaluation  Patient identified by MRN, date of birth, ID band Patient awake    Reviewed: Allergy & Precautions, H&P , NPO status , Patient's Chart, lab work & pertinent test results  History of Anesthesia Complications (+) PONV  Airway Mallampati: II  TM Distance: >3 FB Neck ROM: Full    Dental no notable dental hx. (+) Teeth Intact, Dental Advisory Given   Pulmonary asthma , former smoker,    Pulmonary exam normal breath sounds clear to auscultation       Cardiovascular hypertension, Pt. on medications  Rhythm:Regular Rate:Normal     Neuro/Psych Anxiety Depression negative neurological ROS     GI/Hepatic Neg liver ROS, GERD  Medicated and Controlled,  Endo/Other  diabetes, GestationalHypothyroidism   Renal/GU negative Renal ROS  negative genitourinary   Musculoskeletal  (+) Arthritis , Osteoarthritis,    Abdominal   Peds  Hematology negative hematology ROS (+)   Anesthesia Other Findings   Reproductive/Obstetrics negative OB ROS                             Anesthesia Physical Anesthesia Plan  ASA: II and emergent  Anesthesia Plan: General   Post-op Pain Management:    Induction: Intravenous, Rapid sequence and Cricoid pressure planned  Airway Management Planned: Oral ETT  Additional Equipment:   Intra-op Plan:   Post-operative Plan: Extubation in OR and Possible Post-op intubation/ventilation  Informed Consent: I have reviewed the patients History and Physical, chart, labs and discussed the procedure including the risks, benefits and alternatives for the proposed anesthesia with the patient or authorized representative who has indicated his/her understanding and acceptance.   Dental advisory given  Plan Discussed with: CRNA  Anesthesia Plan Comments:        Anesthesia Quick Evaluation

## 2016-04-18 NOTE — Anesthesia Procedure Notes (Signed)
Procedure Name: Intubation Date/Time: 04/18/2016 12:22 AM Performed by: Mosie Epstein Pre-anesthesia Checklist: Patient identified, Emergency Drugs available, Suction available, Patient being monitored and Timeout performed Patient Re-evaluated:Patient Re-evaluated prior to inductionOxygen Delivery Method: Circle system utilized Preoxygenation: Pre-oxygenation with 100% oxygen Intubation Type: IV induction, Rapid sequence and Cricoid Pressure applied Laryngoscope Size: Mac and 3 Grade View: Grade I Tube type: Subglottic suction tube Tube size: 7.5 mm Number of attempts: 1 Airway Equipment and Method: Stylet Placement Confirmation: ETT inserted through vocal cords under direct vision,  positive ETCO2 and breath sounds checked- equal and bilateral Secured at: 22 cm Tube secured with: Tape Dental Injury: Teeth and Oropharynx as per pre-operative assessment

## 2016-04-18 NOTE — Transfer of Care (Signed)
Immediate Anesthesia Transfer of Care Note  Patient: Crystal Waters  Procedure(s) Performed: Procedure(s): LAPAROTOMY REPAIR OF INCARCERATED INCISIONAL HERNIA WITH VAC PLACEMENT (N/A)  Patient Location: PACU  Anesthesia Type:General  Level of Consciousness: awake  Airway & Oxygen Therapy: Patient Spontanous Breathing and Patient connected to face mask oxygen  Post-op Assessment: Report given to RN and Post -op Vital signs reviewed and stable  Post vital signs: Reviewed and stable  Last Vitals:  Vitals:   04/17/16 2300 04/18/16 0132  BP: 119/66   Pulse: 93 100  Resp: 19 16  Temp:  36.7 C    Last Pain:  Vitals:   04/17/16 2059  TempSrc: Oral  PainSc:          Complications: No apparent anesthesia complications

## 2016-04-18 NOTE — Anesthesia Postprocedure Evaluation (Signed)
Anesthesia Post Note  Patient: Crystal Waters  Procedure(s) Performed: Procedure(s) (LRB): LAPAROTOMY REPAIR OF INCARCERATED INCISIONAL HERNIA WITH VAC PLACEMENT (N/A)  Patient location during evaluation: PACU Anesthesia Type: General Level of consciousness: awake and alert Pain management: pain level controlled Vital Signs Assessment: post-procedure vital signs reviewed and stable Respiratory status: spontaneous breathing, nonlabored ventilation, respiratory function stable and patient connected to nasal cannula oxygen Cardiovascular status: blood pressure returned to baseline and stable Postop Assessment: no signs of nausea or vomiting Anesthetic complications: no       Last Vitals:  Vitals:   04/18/16 0257 04/18/16 0400  BP: (!) 134/55   Pulse: 64   Resp: 18 20  Temp: 36.8 C     Last Pain:  Vitals:   04/18/16 0453  TempSrc:   PainSc: 2                  Zakkiyya Barno,W. EDMOND

## 2016-04-18 NOTE — Op Note (Addendum)
Preoperative diagnosis: incisional hernia with sbo Postoperative diagnosis: same as above Procedure: exploratory laparotomy, primary repair of incarcerated incisional hernia, placement of vac sponge Surgeon: Dr. Serita Grammes Anesthesia: Gen. Estimated blood loss: Minimal Specimens: None Drains: None Complications: None Sponge and needle count was correct 2 at end of operation  Indications: This is a 61 year old female who about a week ago underwent an uncomplicated laparoscopic cholecystectomy. On postoperative day 1 she began having nausea and vomiting and noticed some swelling above her umbilicus. This has not gotten better over the subsequent days. She really is not keeping anything down. She has not passed any stool and has minimal flatus. There is some ecchymosis and redness overlying the mass above her umbilicus as well. She came to the emergency room was noted to have an elevated white blood cell count. She also underwent a CT scan which showed an incarcerated incisional hernia with a small bowel obstruction. I discussed going to the operating room tonight. We did discuss there is a decent chance she might end up with a hernia after this operation. I also discussed an open wound with her.  Procedure: After informed consent was obtained the patient was taken to the operating room. She was given antibiotics. Sequential compression devices were on her legs. She had a nasogastric tube placed before she went to sleep with evacuation of her stomach. She then was placed under general anesthesia without complication. Her abdomen was then prepped and draped in the standard sterile surgical fashion. A surgical timeout was then performed.  I made a midline incision that incorporated the prior laparoscopic incision. I carried this down to the level of the sutures. The sutures from her prior closure had pulled through and the superior to the umbilicus there was about a 5 x 5 cm defect with incarcerated  small bowel. The tissue was all very friable and inflamed. Her tissue was not also very healthy. I reduced the intestine without difficulty there was no evidence of any ischemia. I then opened the defect a little bit more inferiorly and removed all the old suture material. I also debrided some dead material in the wound as well. I then closed the fascia with multiple interrupted #1 Novafil sutures. Again this tissue was not very healthy and I will not be surprised if she develops a hernia over the long term again. This is also made worse by her body habitus. I was able to close this. There was no evidence of any bowel injury. Once I had closed the hernia we then irrigated. I did decide not to close the skin due to concern for infection. I placed a VAC sponge and secured this. This was functional upon completion. She tolerated this well and was extubated and transferred to recovery stable.

## 2016-04-19 LAB — CBC
HCT: 33.2 % — ABNORMAL LOW (ref 36.0–46.0)
Hemoglobin: 10.8 g/dL — ABNORMAL LOW (ref 12.0–15.0)
MCH: 28.8 pg (ref 26.0–34.0)
MCHC: 32.5 g/dL (ref 30.0–36.0)
MCV: 88.5 fL (ref 78.0–100.0)
Platelets: 161 10*3/uL (ref 150–400)
RBC: 3.75 MIL/uL — AB (ref 3.87–5.11)
RDW: 13.2 % (ref 11.5–15.5)
WBC: 10.9 10*3/uL — ABNORMAL HIGH (ref 4.0–10.5)

## 2016-04-19 LAB — BASIC METABOLIC PANEL
Anion gap: 8 (ref 5–15)
BUN: 13 mg/dL (ref 6–20)
CALCIUM: 7.8 mg/dL — AB (ref 8.9–10.3)
CO2: 27 mmol/L (ref 22–32)
CREATININE: 0.76 mg/dL (ref 0.44–1.00)
Chloride: 99 mmol/L — ABNORMAL LOW (ref 101–111)
GFR calc Af Amer: >60 mL/min (ref >60)
Glucose, Bld: 95 mg/dL (ref 65–99)
POTASSIUM: 3.2 mmol/L — AB (ref 3.5–5.1)
SODIUM: 134 mmol/L — AB (ref 135–145)

## 2016-04-19 MED ORDER — SODIUM CHLORIDE 0.9 % IV SOLN
30.0000 meq | Freq: Once | INTRAVENOUS | Status: DC
  Filled 2016-04-19: qty 15

## 2016-04-19 MED ORDER — LEVOTHYROXINE SODIUM 100 MCG IV SOLR
75.0000 ug | Freq: Every day | INTRAVENOUS | Status: DC
  Administered 2016-04-19 – 2016-04-21 (×2): 75 ug via INTRAVENOUS
  Filled 2016-04-19 (×3): qty 5

## 2016-04-19 NOTE — Evaluation (Signed)
Physical Therapy Evaluation Patient Details Name: Crystal Waters MRN: [id number] DOB: 1955/07/25 Today's Date: 04/19/2016   History of Present Illness  This is a 61 year old female who on 04/11/2016 underwent an uncomplicated laparoscopic cholecystectomy. On postoperative day 1 she began having nausea and vomiting and noticed some swelling above her umbilicus. This has not gotten better over the subsequent days. She came to the emergency room was noted to have an elevated white blood cell count. She also underwent a CT scan which showed an incarcerated incisional hernia with a small bowel obstruction. She is s/p exploratory laparotomy, primary repair of incarcerated incisional hernia, placement of vac sponge. Pt has a past medical history including Anxiety; Arthritis; Cancer; Constipation; Depression; Gestational diabetes mellitus; H/O nose injury; Hypertension; PTSD; and Thyroid disease. Pt has a past surgical history that includes Knee surgery (Right, 2009); Thyroid surgery; and Tubal ligation.  Clinical Impression  Pt presents with the above diagnosis. Prior to admission, pt was completely independent, working full time, and caring for her autistic son. Currently pt presents with the below deficits that require further PT follow-up. At this time, it is not anticipated that pt will require further PT services upon discharge, but this is pending progress during hospital stay. Pt will benefit from continuing to be seen acutely to address below deficits in order to assist with safe transition home.     Follow Up Recommendations Supervision for mobility/OOB;No PT follow up    Equipment Recommendations  Rolling walker with 5" wheels    Recommendations for Other Services       Precautions / Restrictions Precautions Precautions: Fall Restrictions Weight Bearing Restrictions: No      Mobility  Bed Mobility Overal bed mobility: Needs Assistance Bed Mobility: Rolling;Sidelying to Sit Rolling: Min  guard Sidelying to sit: Min guard       General bed mobility comments: Min guard for safety and cues for rolling to reduce abdomina strain.   Transfers Overall transfer level: Needs assistance Equipment used: Rolling walker (2 wheeled) Transfers: Sit to/from Stand Sit to Stand: Min assist         General transfer comment: Min A for safety and assistance for set up of equipment before transitioning  Ambulation/Gait Ambulation/Gait assistance: Min assist Ambulation Distance (Feet): 10 Feet Assistive device: Rolling walker (2 wheeled) Gait Pattern/deviations: Step-to pattern Gait velocity: decreased Gait velocity interpretation: Below normal speed for age/gender General Gait Details: Min A for safety and to maneuver equipment. decreased cadence  Stairs            Wheelchair Mobility    Modified Rankin (Stroke Patients Only)       Balance Overall balance assessment: Needs assistance Sitting-balance support: No upper extremity supported;Feet supported Sitting balance-Leahy Scale: Good     Standing balance support: Single extremity supported Standing balance-Leahy Scale: Fair Standing balance comment: able to stand briefly without holding RW                             Pertinent Vitals/Pain Pain Assessment: 0-10 Pain Score: 2  Pain Location: abdomen Pain Descriptors / Indicators: Cramping;Sore Pain Intervention(s): Limited activity within patient's tolerance;Monitored during session;PCA encouraged    Home Living Family/patient expects to be discharged to:: Private residence Living Arrangements: Children Available Help at Discharge: Family;Available PRN/intermittently Type of Home: House Home Access: Stairs to enter Entrance Stairs-Rails: None Entrance Stairs-Number of Steps: 2 Home Layout: One level Home Equipment: Cane - single point Additional Comments: son is  autisic, but has brother in law who has been helping out    Prior Function  Level of Independence: Independent         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 need any assistance     Hand Dominance   Dominant Hand: Right    Extremity/Trunk Assessment   Upper Extremity Assessment Upper Extremity Assessment: Defer to OT evaluation    Lower Extremity Assessment Lower Extremity Assessment: Generalized weakness       Communication   Communication: No difficulties  Cognition Arousal/Alertness: Awake/alert Behavior During Therapy: WFL for tasks assessed/performed Overall Cognitive Status: Within Functional Limits for tasks assessed                      General Comments      Exercises     Assessment/Plan    PT Assessment Patient needs continued PT services  PT Problem List Decreased strength;Decreased activity tolerance;Decreased balance;Decreased mobility;Decreased knowledge of use of DME;Pain          PT Treatment Interventions DME instruction;Gait training;Stair training;Functional mobility training;Therapeutic activities;Therapeutic exercise;Balance training;Patient/family education    PT Goals (Current goals can be found in the Care Plan section)  Acute Rehab PT Goals Patient Stated Goal: to be able to eat again PT Goal Formulation: With patient Time For Goal Achievement: 04/26/16 Potential to Achieve Goals: Good    Frequency Min 3X/week   Barriers to discharge        Co-evaluation               End of Session Equipment Utilized During Treatment: Gait belt Activity Tolerance: Patient tolerated treatment well;Patient limited by fatigue Patient left: in chair;with call bell/phone within reach Nurse Communication: Mobility status         Time: 1110-1147 PT Time Calculation (min) (ACUTE ONLY): 37 min   Charges:   PT Evaluation $PT Eval Moderate Complexity: 1 Procedure PT Treatments $Therapeutic Activity: 8-22 mins   PT G Codes:        Scheryl Marten PT, DPT  [phone]  04/19/2016, 4:21 PM

## 2016-04-19 NOTE — Evaluation (Signed)
Occupational Therapy Evaluation Patient Details Name: Crystal Waters MRN: [id number] DOB: 07/14/1955 Today's Date: 04/19/2016    History of Present Illness This is a 61 year old female who on 04/11/2016 underwent an uncomplicated laparoscopic cholecystectomy. On postoperative day 1 she began having nausea and vomiting and noticed some swelling above her umbilicus. This has not gotten better over the subsequent days. She came to the emergency room was noted to have an elevated white blood cell count. She also underwent a CT scan which showed an incarcerated incisional hernia with a small bowel obstruction. She is s/p exploratory laparotomy, primary repair of incarcerated incisional hernia, placement of vac sponge. Pt has a past medical history including Anxiety; Arthritis; Cancer; Constipation; Depression; Gestational diabetes mellitus; H/O nose injury; Hypertension; PTSD; and Thyroid disease. Pt has a past surgical history that includes Knee surgery (Right, 2009); Thyroid surgery; and Tubal ligation.   Clinical Impression   PTA Pt independent in ADL and mobility. Pt currently min A for ADL as she can cross legs to get to LB for bathing and dressing. Pt will benefit from skilled OT in the acute setting prior to dc to venue below. Pt educated in Keller (grabber/reacher, long handle sponge, toileting aid) and also use of bedside commode to assist in line management. OT will continue to follow and next session should focus on tub transfer (pending line removal) and toileting.     Follow Up Recommendations  No OT follow up;Supervision - Intermittent    Equipment Recommendations  3 in 1 bedside commode (because of how she is shaped, she would benefit from BARI)    Recommendations for Other Services       Precautions / Restrictions Precautions Precautions: Fall Restrictions Weight Bearing Restrictions: No      Mobility Bed Mobility Overal bed mobility: Needs Assistance Bed Mobility:  Rolling;Sidelying to Sit Rolling: Min guard Sidelying to sit: Min guard       General bed mobility comments: Pt just returned to bed, no mobility attempted this session  Transfers Overall transfer level: Needs assistance Equipment used: Rolling walker (2 wheeled) Transfers: Sit to/from Stand Sit to Stand: Min assist         General transfer comment: not attempted this session    Balance Overall balance assessment: Needs assistance Sitting-balance support: No upper extremity supported;Feet supported Sitting balance-Leahy Scale: Good     Standing balance support: Single extremity supported Standing balance-Leahy Scale: Fair Standing balance comment: able to stand briefly without holding RW                            ADL Overall ADL's : Needs assistance/impaired     Grooming: Set up;Sitting   Upper Body Bathing: Minimal assistance;With adaptive equipment Upper Body Bathing Details (indicate cue type and reason): educated on long handle sponge Lower Body Bathing: Min guard;With adaptive equipment Lower Body Bathing Details (indicate cue type and reason): educated on long handle sponge Upper Body Dressing : Set up;Sitting   Lower Body Dressing: Min guard;With adaptive equipment;Sit to/from stand Lower Body Dressing Details (indicate cue type and reason): Pt able to cross feet and bring up to knees for dressing, also educated in Tax adviser Details (indicate cue type and reason): educated on use of 3 in 1 as bedside commode and over the toilet chair         Functional mobility during ADLs:  (not attempted this session)       Vision  Vision Assessment?: No apparent visual deficits   Perception     Praxis      Pertinent Vitals/Pain Pain Assessment: 0-10 Pain Score: 2  Pain Location: abdomen Pain Descriptors / Indicators: Cramping;Sore Pain Intervention(s): Limited activity within patient's tolerance;PCA encouraged     Hand  Dominance Right   Extremity/Trunk Assessment Upper Extremity Assessment Upper Extremity Assessment: Generalized weakness   Lower Extremity Assessment Lower Extremity Assessment: Generalized weakness   Cervical / Trunk Assessment Cervical / Trunk Assessment: Normal   Communication Communication Communication: No difficulties   Cognition Arousal/Alertness: Awake/alert Behavior During Therapy: WFL for tasks assessed/performed Overall Cognitive Status: Within Functional Limits for tasks assessed                     General Comments       Exercises       Shoulder Instructions      Home Living Family/patient expects to be discharged to:: Private residence Living Arrangements: Children Available Help at Discharge: Family;Available PRN/intermittently Type of Home: House Home Access: Stairs to enter [employer] of Steps: 2 Entrance Stairs-Rails: None Home Layout: One level     Bathroom Shower/Tub: Tub/shower unit;Curtain Shower/tub characteristics: Architectural technologist: Standard Bathroom Accessibility: Yes How Accessible: Accessible via walker Home Equipment: Cane - single point;Shower seat (shower seat from mother)   Additional Comments: son is autisic, but has brother in law who has been helping out      Prior Functioning/Environment Level of Independence: Independent        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 need any assistance        OT Problem List: Decreased range of motion;Decreased activity tolerance;Decreased knowledge of use of DME or AE;Decreased safety awareness;Impaired balance (sitting and/or standing);Obesity;Pain;Decreased strength   OT Treatment/Interventions: Self-care/ADL training;DME and/or AE instruction;Energy conservation;Therapeutic activities;Patient/family education    OT Goals(Current goals can be found in the care plan section) Acute Rehab OT Goals Patient Stated Goal: to be able to eat again OT Goal  Formulation: With patient Time For Goal Achievement: 05/03/16 Potential to Achieve Goals: Good  OT Frequency: Min 2X/week   Barriers to D/C:            Co-evaluation              End of Session Equipment Utilized During Treatment: Oxygen Nurse Communication: Mobility status;Other (comment) (Pt's IV continuosly beeping, might need new pulse ox reader)  Activity Tolerance: Patient tolerated treatment well Patient left: in bed;with call bell/phone within reach   Time: [id number] OT Time Calculation (min): 21 min Charges:  OT General Charges $OT Visit: 1 Procedure OT Evaluation $OT Eval Moderate Complexity: 1 Procedure G-Codes:    Merri Ray Erminio Nygard 04/25/2016, 4:41 PM Hulda Humphrey OTR/L [phone]

## 2016-04-19 NOTE — Progress Notes (Signed)
2 Days Post-Op  Subjective: Up in chair, no flatus but feels rumbling  Objective: Vital signs in last 24 hours: Temp:  [98.3 F (36.8 C)-98.6 F (37 C)] 98.6 F (37 C) (02/01 0400) Pulse Rate:  [85-88] 87 (02/01 0400) Resp:  [15-22] 15 (02/01 0449) BP: (120-126)/(55-71) 126/61 (02/01 0400) SpO2:  [97 %-98 %] 98 % (02/01 0449) Last BM Date: 04/09/16  Intake/Output from previous day: 01/31 0701 - 02/01 0700 In: -  Out: 2000 [Urine:2000] Intake/Output this shift: No intake/output data recorded.  General appearance: alert and cooperative Resp: clear to auscultation bilaterally Cardio: regular rate and rhythm GI: soft, erythema around wound resolved, +BS, VAC  Lab Results:   Recent Labs  04/18/16 0542 04/19/16 0343  WBC 13.9* 10.9*  HGB 11.5* 10.8*  HCT 34.1* 33.2*  PLT 190 161   BMET  Recent Labs  04/18/16 0542 04/19/16 0343  NA 130* 134*  K 3.4* 3.2*  CL 92* 99*  CO2 28 27  GLUCOSE 130* 95  BUN 14 13  CREATININE 1.09* 0.76  CALCIUM 8.4* 7.8*   PT/INR No results for input(s): LABPROT, INR in the last 72 hours. ABG No results for input(s): PHART, HCO3 in the last 72 hours.  Invalid input(s): PCO2, PO2  Studies/Results: Ct Abdomen Pelvis W Contrast  Result Date: 04/17/2016 CLINICAL DATA:  Vomiting. Vomiting post cholecystectomy 6 days prior. EXAM: CT ABDOMEN AND PELVIS WITH CONTRAST TECHNIQUE: Multidetector CT imaging of the abdomen and pelvis was performed using the standard protocol following bolus administration of intravenous contrast. CONTRAST:  166mL ISOVUE-300 IOPAMIDOL (ISOVUE-300) INJECTION 61% COMPARISON:  Abdominal radiographs 02/22/2016 FINDINGS: Lower chest: Mild dependent atelectasis. Fluid distends the distal esophagus. Hepatobiliary: Postcholecystectomy with clips in the gallbladder fossa. There is a 5.5 x 3.3 cm fluid collection within the gallbladder fossa with ill-defined borders. No internal air. Minimal pneumobilia noted in the high right  hepatic lobe. No common bile duct dilatation. There is a 15 mm low-density lesion in the right hepatic lobe. 9 mm low-density lesion in the left lobe. Pancreas: No ductal dilatation or inflammation. Spleen: Normal in size without focal abnormality. Adrenals/Urinary Tract: No adrenal nodule. Relatively severe left hydronephrosis and hydroureter, ureter is dilated to the midportion where there is a 17 x 11 x 11 mm stone. The more distal ureter is decompressed. Two adjacent stones measuring 8 mm are identified in the left ureter approximately 4 cm proximally. This is likely chronic as there is marked thinning of the left renal parenchyma. Probable cortical cyst in the upper left kidney. Delayed phase imaging demonstrates absent excretion from the left kidney. There is no perinephric edema. Two nonobstructing stones in the lower right kidney measure 10 and 12 mm respectively. There is no right hydronephrosis. Urinary bladder is decompressed. Stomach/Bowel: Fluid distending the distal esophagus which is dilated. Stomach distended with fluid. Proximal small bowel bowel is dilated and fluid-filled. Within a dilated pelvic small bowel loops is a round 9 mm density which may be ingested material, however is nonspecific. Small bowel transition point of a supraumbilical ventral abdominal wall hernia which contains small bowel and mesenteric vessels. The exiting small bowel is decompressed. More distal small bowel is decompressed. There is no pneumatosis, definite small bowel thickening or mesenteric edema. Normal appendix. Stool throughout the colon without colonic inflammation or distention. Vascular/Lymphatic: Atherosclerosis without aneurysm. No adenopathy. Reproductive: Uterus and bilateral adnexa are unremarkable. Other: No intra-abdominal ascites. Other than gallbladder fossa fluid, no intra- abdominal fluid collection. No pneumoperitoneum. Midline ventral abdominal  wall hernia neck measures 4.0 x 2.9 cm and represents  the site of small bowel obstruction. This appears just superior to the umbilicus. Edema within the right lateral abdominal wall at likely site of port site, no subcutaneous abscess. Musculoskeletal: There are no acute or suspicious osseous abnormalities. Scattered bone islands in the pelvis. Degenerative change throughout spine. IMPRESSION: 1. Small bowel obstruction secondary to a midline supraumbilical ventral abdominal wall hernia. No evidence of small bowel inflammation or perforation. 2. Small fluid collection in the gallbladder fossa measures 5.5 x 3.3 cm, may be postoperative seroma. Superimposed infection or biloma not excluded. 3. Minimal pneumobilia is likely postsurgical. 4. Left hydroureteronephrosis secondary to a large 17 mm stone in the mid left ureter, most certainly chronic. Chronicity suggested by marked renal parenchymal atrophy and lack of perinephric edema. There are additional stones within the dilated left ureter that are nonobstructing. Nonobstructing right renal stones without right hydronephrosis. 5. Aortic atherosclerosis. Electronically Signed   By: Jeb Levering M.D.   On: 04/17/2016 22:16   Dg Abd Portable 1v  Result Date: 04/18/2016 CLINICAL DATA:  Nasogastric tube placement EXAM: PORTABLE ABDOMEN - 1 VIEW COMPARISON:  04/17/2016 FINDINGS: The nasogastric tube extends well into the stomach with tip in the fundus. Moderate small bowel dilatation persists. IMPRESSION: NG tube extends well into the stomach. Electronically Signed   By: Andreas Newport M.D.   On: 04/18/2016 01:57    Anti-infectives: Anti-infectives    Start     Dose/Rate Route Frequency Ordered Stop   04/18/16 0800  cefTRIAXone (ROCEPHIN) 2 g in dextrose 5 % 50 mL IVPB     2 g 100 mL/hr over 30 Minutes Intravenous Every 24 hours 04/18/16 0750     04/17/16 2345  ceFAZolin (ANCEF) IVPB 2g/100 mL premix     2 g 200 mL/hr over 30 Minutes Intravenous  Once 04/17/16 2334 04/18/16 0015       Assessment/Plan: s/p Procedure(s): LAPAROTOMY REPAIR OF INCARCERATED INCISIONAL HERNIA WITH VAC PLACEMENT (N/A) POD#2 Expected ileus - NGT, check again this PM ID - Rocephin for wound cellulitis - improved Hypokalemia - replace Hyponatremia - improved Hypothyroidism - Synthroid IV VTE - Lovenox. PAS Dispo - PT/OT, ileus  LOS: 1 day    Crystal Waters E 04/19/2016

## 2016-04-19 NOTE — Progress Notes (Signed)
Patient ID: Crystal Waters, female   DOB: 07-11-1955, 61 y.o.   MRN: [id number]  A little flatus, has a few BS. NGT output persists. Continue NGT tonight and reassess in AM. Georganna Skeans, MD, MPH, FACS Trauma: [phone] General Surgery: [phone]

## 2016-04-20 LAB — BASIC METABOLIC PANEL
Anion gap: 9 (ref 5–15)
BUN: 11 mg/dL (ref 6–20)
CALCIUM: 7.9 mg/dL — AB (ref 8.9–10.3)
CO2: 25 mmol/L (ref 22–32)
Chloride: 102 mmol/L (ref 101–111)
Creatinine, Ser: 0.75 mg/dL (ref 0.44–1.00)
GFR calc non Af Amer: >60 mL/min (ref >60)
GLUCOSE: 111 mg/dL — AB (ref 65–99)
Potassium: 3.3 mmol/L — ABNORMAL LOW (ref 3.5–5.1)
Sodium: 136 mmol/L (ref 135–145)

## 2016-04-20 MED ORDER — MORPHINE SULFATE (PF) 2 MG/ML IV SOLN
2.0000 mg | INTRAVENOUS | Status: DC | PRN
  Administered 2016-04-20: 2 mg via INTRAVENOUS
  Filled 2016-04-20: qty 1

## 2016-04-20 MED ORDER — SODIUM CHLORIDE 0.9 % IV SOLN
30.0000 meq | Freq: Once | INTRAVENOUS | Status: DC
  Filled 2016-04-20: qty 15

## 2016-04-20 MED ORDER — LIDOCAINE-EPINEPHRINE 2 %-1:100000 IJ SOLN
20.0000 mL | Freq: Once | INTRAMUSCULAR | Status: DC
  Filled 2016-04-20: qty 20

## 2016-04-20 MED ORDER — LIDOCAINE-EPINEPHRINE 1 %-1:100000 IJ SOLN
20.0000 mL | Freq: Once | INTRAMUSCULAR | Status: DC
  Filled 2016-04-20: qty 20

## 2016-04-20 MED ORDER — LIDOCAINE-EPINEPHRINE (PF) 1.5 %-1:200000 IJ SOLN
20.0000 mL | Freq: Once | INTRAMUSCULAR | Status: AC
  Administered 2016-04-20: 20 mL via INTRADERMAL
  Filled 2016-04-20: qty 30

## 2016-04-20 NOTE — Progress Notes (Signed)
3 Days Post-Op  Subjective: Passed a lot of gas  Objective: Vital signs in last 24 hours: Temp:  [97.7 F (36.5 C)-98.6 F (37 C)] 98.6 F (37 C) (02/02 0530) Pulse Rate:  [86-88] 88 (02/02 0530) Resp:  [16-20] 20 (02/02 0530) BP: (112-140)/(62-70) 138/65 (02/02 0530) SpO2:  [96 %-98 %] 96 % (02/02 0530) Last BM Date: 04/09/16  Intake/Output from previous day: 02/01 0701 - 02/02 0700 In: 800 [I.V.:800] Out: 1050 [Urine:350; Emesis/NG output:700] Intake/Output this shift: No intake/output data recorded.  General appearance: alert and cooperative Resp: clear to auscultation bilaterally Cardio: regular rate and rhythm GI: soft, active BS, VAC on wound  Lab Results:   Recent Labs  04/18/16 0542 04/19/16 0343  WBC 13.9* 10.9*  HGB 11.5* 10.8*  HCT 34.1* 33.2*  PLT 190 161   BMET  Recent Labs  04/19/16 0343 04/20/16 0609  NA 134* 136  K 3.2* 3.3*  CL 99* 102  CO2 27 25  GLUCOSE 95 111*  BUN 13 11  CREATININE 0.76 0.75  CALCIUM 7.8* 7.9*   PT/INR No results for input(s): LABPROT, INR in the last 72 hours. ABG No results for input(s): PHART, HCO3 in the last 72 hours.  Invalid input(s): PCO2, PO2  Studies/Results: No results found.  Anti-infectives: Anti-infectives    Start     Dose/Rate Route Frequency Ordered Stop   04/18/16 0800  cefTRIAXone (ROCEPHIN) 2 g in dextrose 5 % 50 mL IVPB  Status:  Discontinued     2 g 100 mL/hr over 30 Minutes Intravenous Every 24 hours 04/18/16 0750 04/20/16 0812   04/17/16 2345  ceFAZolin (ANCEF) IVPB 2g/100 mL premix     2 g 200 mL/hr over 30 Minutes Intravenous  Once 04/17/16 2334 04/18/16 0015      Assessment/Plan: s/p Procedure(s): LAPAROTOMY REPAIR OF INCARCERATED INCISIONAL HERNIA WITH VAC PLACEMENT (N/A) POD#3 Expected ileus - improving, D/C NGT, try clears, decrease IVF ID - Rocephin for wound cellulitis - resolved - D/C Rocephin Hypokalemia - replace, BMET in AM Hypothyroidism - Synthroid IV VTE -  Lovenox. PAS Dispo - PT/OT, ileus At the bedside under sterile conditions I did delayed primary closure of her wound with 3-0 nylon.  LOS: 2 days    Crystal Waters E 04/20/2016

## 2016-04-20 NOTE — Progress Notes (Addendum)
Morphine pump stopped.  16 mL of morphine wasted in sink.  Witnessed by Althia Forts, Warehouse manager.   NG tube and Wound vac removed by MD this am.  Gauze and tape bandage applied to abdomen by MD.  Oxygen removed.

## 2016-04-20 NOTE — Progress Notes (Signed)
Physical Therapy Treatment Patient Details Name: Crystal Waters MRN: [id number] DOB: 21-Aug-1955 Today's Date: 04/20/2016    History of Present Illness This is a 61 year old female who on 04/11/2016 underwent an uncomplicated laparoscopic cholecystectomy. On postoperative day 1 she began having nausea and vomiting and noticed some swelling above her umbilicus. This has not gotten better over the subsequent days. She came to the emergency room was noted to have an elevated white blood cell count. She also underwent a CT scan which showed an incarcerated incisional hernia with a small bowel obstruction. She is s/p exploratory laparotomy, primary repair of incarcerated incisional hernia, placement of vac sponge. Pt has a past medical history including Anxiety; Arthritis; Cancer; Constipation; Depression; Gestational diabetes mellitus; H/O nose injury; Hypertension; PTSD; and Thyroid disease. Pt has a past surgical history that includes Knee surgery (Right, 2009); Thyroid surgery; and Tubal ligation.    PT Comments    Patient is progressing well toward mobility goals. Current plan remains appropriate.   Follow Up Recommendations  Supervision for mobility/OOB;No PT follow up     Equipment Recommendations  Rolling walker with 5" wheels    Recommendations for Other Services       Precautions / Restrictions Precautions Precautions: Fall Restrictions Weight Bearing Restrictions: No    Mobility  Bed Mobility Overal bed mobility: Needs Assistance Bed Mobility: Rolling;Sidelying to Sit;Sit to Supine Rolling: Supervision Sidelying to sit: Supervision   Sit to supine: Supervision   General bed mobility comments: supervision for safety  Transfers Overall transfer level: Needs assistance Equipment used: Rolling walker (2 wheeled) Transfers: Sit to/from Stand Sit to Stand: Supervision         General transfer comment: from EOB and BSC; supervision for safety; cues for hand  placement  Ambulation/Gait Ambulation/Gait assistance: Supervision Ambulation Distance (Feet): 100 Feet Assistive device: Rolling walker (2 wheeled) Gait Pattern/deviations: Step-through pattern Gait velocity: decreased   General Gait Details: supervision for safety; cues for posture; a little guarded due to abdominal pain when standing upright but steady   Stairs            Wheelchair Mobility    Modified Rankin (Stroke Patients Only)       Balance Overall balance assessment: Needs assistance Sitting-balance support: No upper extremity supported;Feet supported Sitting balance-Leahy Scale: Good     Standing balance support: Single extremity supported Standing balance-Leahy Scale: Fair Standing balance comment: able to stand briefly without holding RW                    Cognition Arousal/Alertness: Awake/alert Behavior During Therapy: WFL for tasks assessed/performed Overall Cognitive Status: Within Functional Limits for tasks assessed                      Exercises      General Comments        Pertinent Vitals/Pain Pain Assessment: Faces Faces Pain Scale: Hurts little more Pain Location: abdomen Pain Descriptors / Indicators: Sore;Guarding Pain Intervention(s): Limited activity within patient's tolerance;Monitored during session;Premedicated before session;Repositioned    Home Living                      Prior Function            PT Goals (current goals can now be found in the care plan section) Acute Rehab PT Goals Patient Stated Goal: go home PT Goal Formulation: With patient Time For Goal Achievement: 04/26/16 Potential to Achieve Goals: Good Progress towards PT  goals: Progressing toward goals    Frequency    Min 3X/week      PT Plan Current plan remains appropriate    Co-evaluation             End of Session Equipment Utilized During Treatment: Gait belt Activity Tolerance: Patient tolerated  treatment well Patient left: in chair;with call bell/phone within reach     Time: 1206-1233 PT Time Calculation (min) (ACUTE ONLY): 27 min  Charges:  $Gait Training: 8-22 mins $Therapeutic Activity: 8-22 mins                    G Codes:      Salina April, PTA Pager: [phone]   04/20/2016, 2:18 PM

## 2016-04-21 LAB — BASIC METABOLIC PANEL
Anion gap: 7 (ref 5–15)
BUN: 8 mg/dL (ref 6–20)
CHLORIDE: 107 mmol/L (ref 101–111)
CO2: 23 mmol/L (ref 22–32)
CREATININE: 0.76 mg/dL (ref 0.44–1.00)
Calcium: 8 mg/dL — ABNORMAL LOW (ref 8.9–10.3)
GFR calc Af Amer: >60 mL/min (ref >60)
GFR calc non Af Amer: >60 mL/min (ref >60)
GLUCOSE: 137 mg/dL — AB (ref 65–99)
Potassium: 3.4 mmol/L — ABNORMAL LOW (ref 3.5–5.1)
SODIUM: 137 mmol/L (ref 135–145)

## 2016-04-21 MED ORDER — CEFAZOLIN SODIUM-DEXTROSE 2-4 GM/100ML-% IV SOLN: 2.0000 g | INTRAVENOUS | Status: DC

## 2016-04-21 MED ORDER — FAMOTIDINE 20 MG PO TABS
20.0000 mg | ORAL_TABLET | Freq: Every day | ORAL | Status: DC
  Administered 2016-04-21 – 2016-04-22 (×2): 20 mg via ORAL
  Filled 2016-04-21 (×2): qty 1

## 2016-04-21 MED ORDER — MORPHINE SULFATE (PF) 2 MG/ML IV SOLN: 2.0000 mg | INTRAVENOUS | Status: DC | PRN

## 2016-04-21 MED ORDER — POTASSIUM CHLORIDE CRYS ER 20 MEQ PO TBCR
40.0000 meq | EXTENDED_RELEASE_TABLET | Freq: Two times a day (BID) | ORAL | Status: AC
  Administered 2016-04-21 (×2): 40 meq via ORAL
  Filled 2016-04-21 (×2): qty 2

## 2016-04-21 MED ORDER — BUPROPION HCL ER (XL) 150 MG PO TB24
300.0000 mg | ORAL_TABLET | Freq: Every day | ORAL | Status: DC
  Administered 2016-04-21 – 2016-04-22 (×2): 300 mg via ORAL
  Filled 2016-04-21 (×2): qty 2

## 2016-04-21 MED ORDER — THYROID 30 MG PO TABS
150.0000 mg | ORAL_TABLET | Freq: Every day | ORAL | Status: DC
  Administered 2016-04-22: 150 mg via ORAL
  Filled 2016-04-21: qty 1

## 2016-04-21 MED ORDER — TRAMADOL HCL 50 MG PO TABS: 50.0000 mg | ORAL_TABLET | Freq: Four times a day (QID) | ORAL | Status: DC | PRN

## 2016-04-21 MED ORDER — CHLORHEXIDINE GLUCONATE CLOTH 2 % EX PADS: 6.0000 | MEDICATED_PAD | Freq: Once | CUTANEOUS | Status: DC

## 2016-04-21 NOTE — Progress Notes (Signed)
Pt A&OX4, VSS, on RA and no evidence of falls throughout shift. Pts pain being managed with tylenol. Pt tolerating full liquid diet, no c/o nausea and reporting gas. Dressing changed to abd. Will continue to monitor.

## 2016-04-21 NOTE — Progress Notes (Signed)
S: No acute events. Tolerating clears and passing a lot of flatus. Minimal pain.   Vitals, labs, intake/output, and orders reviewed at this time. Hypokalemia- 3.4  153mL PO intake yesterday and one BM recorded.  Gen: A&Ox3, no distress  H&N: EOMI, atraumatic, neck supple Chest: unlabored respirations, RRR Abd: soft, minimally tender, nondistended, incision c/d/i with interrupted nylons Ext: warm, no edema Neuro: grossly normal  Lines/tubes/drains: PIV  A/P:  POD 4 LAPAROTOMY REPAIR OF INCARCERATED INCISIONAL HERNIA WITH VAC PLACEMENT (N/A)  Expected ileus- improving, advance to full liquids, stop IVF ID- no wound cellulitis or drainage, afebrile Hypokalemia- replace with oral meds; it appears the IV replacements were not administered??, BMET in AM Hypothyroidism - resume home medications VTE- Lovenox. PAS Dispo- PT/OT, ileus. She may be ready tomorrow.   Romana Juniper, MD [facility], [place] Pager [phone]

## 2016-04-22 LAB — BASIC METABOLIC PANEL
Anion gap: 6 (ref 5–15)
BUN: 7 mg/dL (ref 6–20)
CALCIUM: 8.2 mg/dL — AB (ref 8.9–10.3)
CO2: 25 mmol/L (ref 22–32)
CREATININE: 0.74 mg/dL (ref 0.44–1.00)
Chloride: 107 mmol/L (ref 101–111)
GFR calc Af Amer: >60 mL/min (ref >60)
GLUCOSE: 92 mg/dL (ref 65–99)
Potassium: 4.5 mmol/L (ref 3.5–5.1)
Sodium: 138 mmol/L (ref 135–145)

## 2016-04-22 LAB — MAGNESIUM: Magnesium: 1.6 mg/dL — ABNORMAL LOW (ref 1.7–2.4)

## 2016-04-22 MED ORDER — MAGNESIUM OXIDE 400 (241.3 MG) MG PO TABS
400.0000 mg | ORAL_TABLET | Freq: Every day | ORAL | Status: AC
  Administered 2016-04-22: 400 mg via ORAL
  Filled 2016-04-22: qty 1

## 2016-04-22 NOTE — Discharge Instructions (Signed)
[facility], [place] [phone]  OPEN ABDOMINAL SURGERY: POST OP INSTRUCTIONS  Always review your discharge instruction sheet given to you by the facility where your surgery was performed.  IF YOU HAVE DISABILITY OR FAMILY LEAVE FORMS, YOU MUST BRING THEM TO THE OFFICE FOR PROCESSING.  PLEASE DO NOT GIVE THEM TO YOUR DOCTOR.  1. A prescription for pain medication may be given to you upon discharge.  Take your pain medication as prescribed, if needed.  If narcotic pain medicine is not needed, then you may take acetaminophen (Tylenol) or ibuprofen (Advil) as needed. 2. Take your usually prescribed medications unless otherwise directed. 3. If you need a refill on your pain medication, please contact your pharmacy. They will contact our office to request authorization.  Prescriptions will not be filled after 5pm or on week-ends. 4. You should follow a light diet the first few days after arrival home, such as soup and crackers, pudding, etc.unless your doctor has advised otherwise. A high-fiber, low fat diet can be resumed as tolerated.   Be sure to include lots of fluids daily. Most patients will experience some swelling and bruising on the chest and neck area.  Ice packs will help.  Swelling and bruising can take several days to resolve 5. Most patients will experience some swelling and bruising in the area of the incision. Ice pack will help. Swelling and bruising can take several days to resolve..  6. It is common to experience some constipation if taking pain medication after surgery.  Increasing fluid intake and taking a stool softener will usually help or prevent this problem from occurring.  A mild laxative (Milk of Magnesia or Miralax) should be taken according to package directions if there are no bowel movements after 48 hours. 7.  Your sutures will be removed at the office during your follow-up visit. You may find that a light gauze bandage over your incision may keep your  sutures from being rubbed or pulled. You may shower and replace the bandage daily. 8. ACTIVITIES:  You may resume regular (light) daily activities beginning the next day--such as daily self-care, walking, climbing stairs--gradually increasing activities as tolerated.  You may have sexual intercourse when it is comfortable.  Refrain from any heavy lifting or straining until approved by your doctor. a. You may drive when you no longer are taking prescription pain medication, you can comfortably wear a seatbelt, and you can safely maneuver your car and apply brakes 9. You should see your doctor in the office for a follow-up appointment approximately two weeks after your surgery.  Make sure that you call for this appointment within a day or two after you arrive home to insure a convenient appointment time. OTHER INSTRUCTIONS:  _____________________________________________________________ _____________________________________________________________  WHEN TO CALL YOUR DOCTOR: 1. Fever over 101.0 2. Inability to urinate 3. Nausea and/or vomiting 4. Extreme swelling or bruising 5. Continued bleeding from incision. 6. Increased pain, redness, or drainage from the incision. 7. Difficulty swallowing or breathing 8. Muscle cramping or spasms. 9. Numbness or tingling in hands or feet or around lips.  The clinic staff is available to answer your questions during regular business hours.  Please dont hesitate to call and ask to speak to one of the nurses if you have concerns.  For further questions, please visit [email or web address]

## 2016-04-22 NOTE — Progress Notes (Signed)
Discharge instructions reviewed with patient. Patient stated understanding. IV removed. Patient is waiting for someone to come and pick her up to take her home

## 2016-04-22 NOTE — Progress Notes (Signed)
Patient ID: Crystal Waters, female   DOB: Jan 31, 1956, 61 y.o.   MRN: [id number]  [facility] Surgery Progress Note  5 Days Post-Op  Subjective: Feeling well this morning. Tolerating diet. Had a good BM this morning. Only taking tylenol for pain. Hopes to go home today.  Objective: Vital signs in last 24 hours: Temp:  [98.7 F (37.1 C)-99 F (37.2 C)] 99 F (37.2 C) (02/04 0523) Pulse Rate:  [82-89] 82 (02/04 0523) Resp:  [18] 18 (02/03 2128) BP: (128-145)/(58-66) 144/58 (02/04 0523) SpO2:  [95 %-97 %] 97 % (02/04 0523) Last BM Date: 04/09/16  Intake/Output from previous day: 02/03 0701 - 02/04 0700 In: 970 [P.O.:840; IV Piggyback:110] Out: -  Intake/Output this shift: No intake/output data recorded.  PE: Gen:  Alert, NAD, pleasant Pulm:  Effort normal Abd: Soft, NT/ND, +BS, midline incisions C/D/I with sutures intact Ext:  No erythema, edema, or tenderness   Lab Results:  No results for input(s): WBC, HGB, HCT, PLT in the last 72 hours. BMET  Recent Labs  04/21/16 0950 04/22/16 0327  NA 137 138  K 3.4* 4.5  CL 107 107  CO2 23 25  GLUCOSE 137* 92  BUN 8 7  CREATININE 0.76 0.74  CALCIUM 8.0* 8.2*   PT/INR No results for input(s): LABPROT, INR in the last 72 hours. CMP     Component Value Date/Time   NA 138 04/22/2016 0327   K 4.5 04/22/2016 0327   CL 107 04/22/2016 0327   CO2 25 04/22/2016 0327   GLUCOSE 92 04/22/2016 0327   BUN 7 04/22/2016 0327   CREATININE 0.74 04/22/2016 0327   CREATININE 0.91 02/21/2016 1100   CALCIUM 8.2 (L) 04/22/2016 0327   PROT 6.7 04/17/2016 1756   ALBUMIN 3.3 (L) 04/17/2016 1756   AST 41 04/17/2016 1756   ALT 88 (H) 04/17/2016 1756   ALKPHOS 74 04/17/2016 1756   BILITOT 1.1 04/17/2016 1756   GFRNONAA >60 04/22/2016 0327   GFRNONAA 60 04/11/2015 1656   GFRAA >60 04/22/2016 0327   GFRAA 69 04/11/2015 1656   Lipase     Component Value Date/Time   LIPASE 76 (H) 04/17/2016 1756       Studies/Results: No  results found.  Anti-infectives: Anti-infectives    Start     Dose/Rate Route Frequency Ordered Stop   04/22/16 0600  ceFAZolin (ANCEF) IVPB 2g/100 mL premix  Status:  Discontinued     2 g 200 mL/hr over 30 Minutes Intravenous On call to O.R. 04/21/16 1803 04/21/16 1827   04/18/16 0800  cefTRIAXone (ROCEPHIN) 2 g in dextrose 5 % 50 mL IVPB  Status:  Discontinued     2 g 100 mL/hr over 30 Minutes Intravenous Every 24 hours 04/18/16 0750 04/20/16 0812   04/17/16 2345  ceFAZolin (ANCEF) IVPB 2g/100 mL premix     2 g 200 mL/hr over 30 Minutes Intravenous  Once 04/17/16 2334 04/18/16 0015       Assessment/Plan A/P:  POD 5 LAPAROTOMY REPAIR OF INCARCERATED INCISIONAL HERNIA WITH VAC PLACEMENT (N/A)  Expected ileus- resolved ID- wound clean, afebrile Hypokalemia- resolved Hypomagnesemia - replace with oral tablets prior to d/c Hypothyroidism- home medications VTE- Lovenox. PAS FEN - soft diet Dispo- ready for discharge. Follow-up in 1 week for suture removal.    LOS: 4 days    Jerrye Beavers , [facility] 04/22/2016, 8:14 AM Pager: [phone] Consults: [phone] Mon-Fri 7:00 am-4:30 pm Sat-Sun 7:00 am-11:30 am

## 2016-04-25 NOTE — Discharge Summary (Signed)
[facility] Discharge Summary   Patient ID: Crystal Waters MRN: [id number] DOB/AGE: 1956-02-03 61 y.o.  Admit date: 04/17/2016 Discharge date: 04/22/2016  Admitting Diagnosis: Small bowel obstruction  Discharge Diagnosis Patient Active Problem List   Diagnosis Date Noted  . Incarcerated incisional hernia 04/18/2016  . Left shoulder pain 10/03/2015  . Glucose intolerance (impaired glucose tolerance) 02/07/2015  . Atypical nevi 10/29/2013  . OA (osteoarthritis) of knee 01/15/2013  . Obesity 01/15/2013  . Essential hypertension, benign 01/15/2013  . Hypothyroidism 01/15/2013  . Chronic insomnia 01/15/2013  . Major depressive disorder 01/15/2013  . GAD (generalized anxiety disorder) 01/15/2013    Consultants None  Imaging: CT abdomen pelvis w contrast 04/17/16: 1. Small bowel obstruction secondary to a midline supraumbilical ventral abdominal wall hernia. No evidence of small bowel inflammation or perforation. 2. Small fluid collection in the gallbladder fossa measures 5.5 x 3.3 cm, may be postoperative seroma. Superimposed infection or biloma not excluded. 3. Minimal pneumobilia is likely postsurgical. 4. Left hydroureteronephrosis secondary to a large 17 mm stone in the mid left ureter, most certainly chronic. Chronicity suggested by marked renal parenchymal atrophy and lack of perinephric edema. There are additional stones within the dilated left ureter that are nonobstructing. Nonobstructing right renal stones without right hydronephrosis. 5. Aortic atherosclerosis.  Procedures Dr. Donne Hazel (04/18/16) - Exploratory laparotomy, primary repair of incarcerated incisional hernia, placement of vac sponge  Hospital Course:  Crystal Waters is a 61yo female s/p lap chole 04/11/16 who presented to [facility] 04/17/16 with progressive abdominal pain, nausea, and vomiting.  Workup showed incisional hernia with SBO.  Patient was admitted and underwent procedure listed above.   Tolerated procedure well and was transferred to the floor.  She had a postop ileus as expected that resolved in good time. NG tube was discontinued 04/20/16 and diet slowly advanced. On POD5 the patient was voiding well, tolerating diet, ambulating well, pain well controlled, vital signs stable, incisions c/d/i and felt stable for discharge home.  She will follow-up in our office in 1 week for suture removal, and with Dr. Donne Hazel in 2-3 weeks. She knows to call with questions or concerns.    Physical Exam: Gen:  Alert, NAD, pleasant Pulm:  Effort normal Abd: Soft, NT/ND, +BS, midline incisions C/D/I with sutures intact Ext:  No erythema, edema, or tenderness  Allergies as of 04/22/2016      Reactions   Gluten Meal Rash   Zoloft [sertraline Hcl] Other (See Comments)   STOMACH ACHES      Medication List    TAKE these medications   ALPRAZolam 0.25 MG tablet Commonly known as:  XANAX TAKE 1 TABLET BY MOUTH AT BEDTIME AS NEEDED SLEEP OR ANXIETY   buPROPion 300 MG 24 hr tablet Commonly known as:  WELLBUTRIN XL TAKE 1 TABLET (300 MG TOTAL) BY MOUTH DAILY.   diclofenac sodium 1 % Gel Commonly known as:  VOLTAREN Apply to affected areas four times a day as needed   HYDROcodone-acetaminophen 5-325 MG tablet Commonly known as:  NORCO/VICODIN Take 1-2 tablets by mouth every 4 (four) hours as needed for moderate pain or severe pain.   ibuprofen 200 MG tablet Commonly known as:  ADVIL,MOTRIN Take 400 mg by mouth every 8 (eight) hours as needed for mild pain or moderate pain.   lisinopril 10 MG tablet Commonly known as:  PRINIVIL,ZESTRIL TAKE 1 TABLET (10 MG TOTAL) BY MOUTH DAILY.   loratadine 10 MG tablet Commonly known as:  CLARITIN Take 10 mg by mouth daily.  metoCLOPramide 10 MG tablet Commonly known as:  REGLAN Take 10 mg by mouth every 8 (eight) hours as needed for nausea.   ranitidine 150 MG tablet Commonly known as:  ZANTAC TAKE 1 TABLET BY MOUTH TWICE A DAY What changed:   See the new instructions.   thyroid 60 MG tablet Commonly known as:  ARMOUR THYROID Take 2.5 tablets (150 mg total) by mouth daily before breakfast.   triamcinolone cream 0.1 % Commonly known as:  KENALOG Apply 1 application topically 2 (two) times daily. What changed:  when to take this  reasons to take this   Vitamin D 2000 units tablet Take 2,000 Units by mouth daily.        Follow-up [facility], [place]. Call.   Specialty:  General Surgery Why:  Please call to make an appointment for 04/30/16 to have your sutures removed by one of our nurses. Contact information: [address] [place], [place]. Schedule an appointment as soon as possible for a visit.   Specialty:  General Surgery Why:  2-3 weeks Contact information: [address] [place] [address] [phone]           Signed: Jerrye Beavers, [facility] 04/25/2016, 2:24 PM Pager: [phone] Consults: [phone] Mon-Fri 7:00 am-4:30 pm Sat-Sun 7:00 am-11:30 am

## 2016-05-15 ENCOUNTER — Encounter: Payer: Self-pay | Admitting: Family Medicine

## 2016-05-15 ENCOUNTER — Other Ambulatory Visit: Payer: Self-pay | Admitting: Family Medicine

## 2016-05-15 ENCOUNTER — Ambulatory Visit (INDEPENDENT_AMBULATORY_CARE_PROVIDER_SITE_OTHER): Payer: BLUE CROSS/BLUE SHIELD | Admitting: Family Medicine

## 2016-05-15 VITALS — BP 128/72 | HR 88 | Temp 98.6°F | Resp 14 | Ht 67.0 in | Wt 230.0 lb

## 2016-05-15 DIAGNOSIS — Z0000 Encounter for general adult medical examination without abnormal findings: Secondary | ICD-10-CM

## 2016-05-15 DIAGNOSIS — Z6838 Body mass index (BMI) 38.0-38.9, adult: Secondary | ICD-10-CM | POA: Diagnosis not present

## 2016-05-15 DIAGNOSIS — E6609 Other obesity due to excess calories: Secondary | ICD-10-CM

## 2016-05-15 DIAGNOSIS — I10 Essential (primary) hypertension: Secondary | ICD-10-CM | POA: Diagnosis not present

## 2016-05-15 DIAGNOSIS — IMO0001 Reserved for inherently not codable concepts without codable children: Secondary | ICD-10-CM

## 2016-05-15 LAB — COMPREHENSIVE METABOLIC PANEL
ALK PHOS: 66 U/L (ref 33–130)
ALT: 34 U/L — AB (ref 6–29)
AST: 19 U/L (ref 10–35)
Albumin: 3.7 g/dL (ref 3.6–5.1)
BILIRUBIN TOTAL: 0.4 mg/dL (ref 0.2–1.2)
BUN: 16 mg/dL (ref 7–25)
CO2: 26 mmol/L (ref 20–31)
CREATININE: 0.87 mg/dL (ref 0.50–0.99)
Calcium: 9.3 mg/dL (ref 8.6–10.4)
Chloride: 107 mmol/L (ref 98–110)
Glucose, Bld: 111 mg/dL — ABNORMAL HIGH (ref 70–99)
POTASSIUM: 4.7 mmol/L (ref 3.5–5.3)
Sodium: 139 mmol/L (ref 135–146)
TOTAL PROTEIN: 6.6 g/dL (ref 6.1–8.1)

## 2016-05-15 LAB — LIPID PANEL
CHOL/HDL RATIO: 3.4 ratio (ref <5.0)
CHOLESTEROL: 189 mg/dL (ref <200)
HDL: 55 mg/dL (ref >50)
LDL Cholesterol: 100 mg/dL — ABNORMAL HIGH (ref <100)
Triglycerides: 172 mg/dL — ABNORMAL HIGH (ref <150)
VLDL: 34 mg/dL — ABNORMAL HIGH (ref <30)

## 2016-05-15 LAB — CBC WITH DIFFERENTIAL/PLATELET
BASOS PCT: 0 %
Basophils Absolute: 0 cells/uL (ref 0–200)
EOS ABS: 94 {cells}/uL (ref 15–500)
Eosinophils Relative: 2 %
HCT: 38.5 % (ref 35.0–45.0)
Hemoglobin: 12.6 g/dL (ref 12.0–15.0)
Lymphocytes Relative: 28 %
Lymphs Abs: 1316 cells/uL (ref 850–3900)
MCH: 28.9 pg (ref 27.0–33.0)
MCHC: 32.7 g/dL (ref 32.0–36.0)
MCV: 88.3 fL (ref 80.0–100.0)
MONO ABS: 470 {cells}/uL (ref 200–950)
MONOS PCT: 10 %
MPV: 11.1 fL (ref 7.5–12.5)
NEUTROS ABS: 2820 {cells}/uL (ref 1500–7800)
Neutrophils Relative %: 60 %
PLATELETS: 190 10*3/uL (ref 140–400)
RBC: 4.36 MIL/uL (ref 3.80–5.10)
RDW: 14.6 % (ref 11.0–15.0)
WBC: 4.7 10*3/uL (ref 3.8–10.8)

## 2016-05-15 NOTE — Progress Notes (Signed)
   Subjective:    Patient ID: Crystal Waters, female    DOB: May 01, 1955, 61 y.o.   MRN: [id number]  Patient presents for [facility] for annual physical exam wellness exam. Her work is changing to recommendations therefore she needs a physical every calendar year. She just had a physical in December 2017. 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 She still out recovering. She has follow-up with general surgery. She did lose 15 pounds that she was unable to eat for about 2 weeks straight is slowly returning to her regular diet Her medications were reviewed No new concerns today.   Review Of Systems:  GEN- denies fatigue, fever, weight loss,weakness, recent illness HEENT- denies eye drainage, change in vision, nasal discharge, CVS- denies chest pain, palpitations RESP- denies SOB, cough, wheeze ABD- denies N/V, change in stools, abd pain GU- denies dysuria, hematuria, dribbling, incontinence MSK- denies joint pain, muscle aches, injury Neuro- denies headache, dizziness, syncope, seizure activity       Objective:    BP 128/72   Pulse 88   Temp 98.6 F (37 C) (Oral)   Resp 14   Ht 5\' 7"  (1.702 m)   Wt 230 lb (104.3 kg)   SpO2 98%   BMI 36.02 kg/m  GEN- NAD, alert and oriented x3,weight loss  HEENT- PERRL, EOMI, non injected sclera, pink conjunctiva, MMM, oropharynx clear Neck- Supple, no thyromegaly CVS- RRR, no murmur RESP-CTAB ABD-NABS,soft,NT,ND EXT- No edema Pulses- Radial  2+        Assessment & Plan:      Problem List Items Addressed This Visit    Obesity   Essential hypertension, benign    Controlled no changes Weight loss in setting of post surgical complication with SBO, energy and appetite improving       Relevant  Orders   CBC with Differential/Platelet   Comprehensive metabolic panel   Lipid panel    Other Visit Diagnoses    Routine general medical examination at a health care facility    -  Primary   CPE done, immunizations UTD, prevention UTD, recheck labs, she did have some hypokalemia after sugery repleted in hospital   Relevant Orders   CBC with Differential/Platelet   Comprehensive metabolic panel      Note: This dictation was prepared with Dragon dictation along with smaller phrase technology. Any transcriptional errors that result from this process are unintentional.

## 2016-05-15 NOTE — Assessment & Plan Note (Signed)
Controlled no changes Weight loss in setting of post surgical complication with SBO, energy and appetite improving

## 2016-05-15 NOTE — Patient Instructions (Signed)
Change F/U to 6 months

## 2016-05-21 LAB — HEMOGLOBIN A1C
Hgb A1c MFr Bld: 5.3 % (ref <5.7)
Mean Plasma Glucose: 105 mg/dL

## 2016-08-11 ENCOUNTER — Other Ambulatory Visit: Payer: Self-pay | Admitting: Family Medicine

## 2016-08-21 ENCOUNTER — Ambulatory Visit (INDEPENDENT_AMBULATORY_CARE_PROVIDER_SITE_OTHER): Payer: BLUE CROSS/BLUE SHIELD | Admitting: Family Medicine

## 2016-08-21 ENCOUNTER — Encounter: Payer: Self-pay | Admitting: Family Medicine

## 2016-08-21 VITALS — BP 122/78 | HR 82 | Temp 98.5°F | Resp 12 | Ht 67.0 in | Wt 234.0 lb

## 2016-08-21 DIAGNOSIS — E559 Vitamin D deficiency, unspecified: Secondary | ICD-10-CM

## 2016-08-21 DIAGNOSIS — F411 Generalized anxiety disorder: Secondary | ICD-10-CM | POA: Diagnosis not present

## 2016-08-21 DIAGNOSIS — I10 Essential (primary) hypertension: Secondary | ICD-10-CM | POA: Diagnosis not present

## 2016-08-21 DIAGNOSIS — F3342 Major depressive disorder, recurrent, in full remission: Secondary | ICD-10-CM

## 2016-08-21 DIAGNOSIS — E038 Other specified hypothyroidism: Secondary | ICD-10-CM

## 2016-08-21 DIAGNOSIS — Z6836 Body mass index (BMI) 36.0-36.9, adult: Secondary | ICD-10-CM

## 2016-08-21 LAB — CBC WITH DIFFERENTIAL/PLATELET
BASOS PCT: 0 %
Basophils Absolute: 0 cells/uL (ref 0–200)
EOS ABS: 50 {cells}/uL (ref 15–500)
Eosinophils Relative: 1 %
HEMATOCRIT: 40.8 % (ref 35.0–45.0)
Hemoglobin: 12.8 g/dL (ref 12.0–15.0)
LYMPHS PCT: 26 %
Lymphs Abs: 1300 cells/uL (ref 850–3900)
MCH: 28.3 pg (ref 27.0–33.0)
MCHC: 31.4 g/dL — ABNORMAL LOW (ref 32.0–36.0)
MCV: 90.3 fL (ref 80.0–100.0)
MONO ABS: 350 {cells}/uL (ref 200–950)
MONOS PCT: 7 %
MPV: 10.5 fL (ref 7.5–12.5)
NEUTROS ABS: 3300 {cells}/uL (ref 1500–7800)
Neutrophils Relative %: 66 %
PLATELETS: 193 10*3/uL (ref 140–400)
RBC: 4.52 MIL/uL (ref 3.80–5.10)
RDW: 13.6 % (ref 11.0–15.0)
WBC: 5 10*3/uL (ref 3.8–10.8)

## 2016-08-21 NOTE — Assessment & Plan Note (Addendum)
Well controlled no changes 

## 2016-08-21 NOTE — Assessment & Plan Note (Signed)
Recheck her thyroid function studies with her increased hair shedding will also check hemoglobin she has had some anemia in the past. Adjust as needed.

## 2016-08-21 NOTE — Assessment & Plan Note (Addendum)
Encourage increase in activity. She has kept the weight off that she lost last year. Continue to work on dietary changes.

## 2016-08-21 NOTE — Patient Instructions (Signed)
F/U 6 months Physical  We will call with lab results   

## 2016-08-21 NOTE — Assessment & Plan Note (Signed)
Doing well on current regimen of  Wellbutrin and alprazolam no changes

## 2016-08-21 NOTE — Progress Notes (Signed)
   Subjective:    Patient ID: Crystal Waters, female    DOB: Aug 02, 1955, 61 y.o.   MRN: [id number]  Patient presents for 6 month F/U (is fasting) and Hair Loss (states that she has increased loss of hair)   Pt here to f/u chronic medical problems.  Medications reviewed.    Hypothyroidism- on armour thyroid   Derression/GAD- on wellbutrin and xanax as needed  HTN- taking lisinopril as prescribed    Mild hyerlipidemia- TG elevated at 172 at check in Feb   Hair shedding more than usual, feels like it coming out in clumps   Still gets some discomfort near her surgical incision, uses a support belt at home, has not started exercise, planning to start with aerobic exercise in water  Weight staying down, was 245lbs     Review Of Systems:  GEN- denies fatigue, fever, weight loss,weakness, recent illness HEENT- denies eye drainage, change in vision, nasal discharge, CVS- denies chest pain, palpitations RESP- denies SOB, cough, wheeze ABD- denies N/V, change in stools, abd pain GU- denies dysuria, hematuria, dribbling, incontinence MSK- denies joint pain, muscle aches, injury Neuro- denies headache, dizziness, syncope, seizure activity       Objective:    BP 122/78   Pulse 82   Temp 98.5 F (36.9 C) (Oral)   Resp 12   Ht 5\' 7"  (1.702 m)   Wt 234 lb (106.1 kg)   SpO2 98%   BMI 36.65 kg/m  GEN- NAD, alert and oriented x3 HEENT- PERRL, EOMI, non injected sclera, pink conjunctiva, MMM, oropharynx clear Neck- Supple CVS- RRR, no murmur RESP-CTAB ABD-NABS,soft,NT,ND Psych- Normal affect and mood EXT- No edema Pulses- Radial, DP- 2+        Assessment & Plan:      Problem List Items Addressed This Visit    Obesity    Encourage increase in activity. She has kept the weight off that she lost last year. Continue to work on dietary changes.      Major depressive disorder    Doing well on current regimen of  Wellbutrin and alprazolam no changes      Hypothyroidism -  Primary    Recheck her thyroid function studies with her increased hair shedding will also check hemoglobin she has had some anemia in the past. Adjust as needed.      Relevant Orders   TSH   T3, free   T4, free   GAD (generalized anxiety disorder)   Essential hypertension, benign    Well-controlled no changes      Relevant Orders   CBC with Differential/Platelet   Comprehensive metabolic panel    Other Visit Diagnoses    Vitamin D deficiency       Relevant Orders   Vitamin D, 25-hydroxy      Note: This dictation was prepared with Dragon dictation along with smaller phrase technology. Any transcriptional errors that result from this process are unintentional.

## 2016-08-22 LAB — COMPREHENSIVE METABOLIC PANEL
ALK PHOS: 57 U/L (ref 33–130)
ALT: 11 U/L (ref 6–29)
AST: 13 U/L (ref 10–35)
Albumin: 3.8 g/dL (ref 3.6–5.1)
BILIRUBIN TOTAL: 0.4 mg/dL (ref 0.2–1.2)
BUN: 17 mg/dL (ref 7–25)
CO2: 24 mmol/L (ref 20–31)
Calcium: 9.5 mg/dL (ref 8.6–10.4)
Chloride: 108 mmol/L (ref 98–110)
Creat: 0.99 mg/dL (ref 0.50–0.99)
GLUCOSE: 90 mg/dL (ref 70–99)
POTASSIUM: 5 mmol/L (ref 3.5–5.3)
Sodium: 143 mmol/L (ref 135–146)
TOTAL PROTEIN: 6.5 g/dL (ref 6.1–8.1)

## 2016-08-22 LAB — TSH: TSH: 0.06 m[IU]/L — AB

## 2016-08-22 LAB — T3, FREE: T3, Free: 3 pg/mL (ref 2.3–4.2)

## 2016-08-22 LAB — T4, FREE: FREE T4: 1.1 ng/dL (ref 0.8–1.8)

## 2016-08-22 LAB — VITAMIN D 25 HYDROXY (VIT D DEFICIENCY, FRACTURES): Vit D, 25-Hydroxy: 29 ng/mL — ABNORMAL LOW (ref 30–100)

## 2016-08-23 ENCOUNTER — Other Ambulatory Visit: Payer: Self-pay | Admitting: *Deleted

## 2016-08-23 MED ORDER — THYROID 60 MG PO TABS: ORAL_TABLET | ORAL | 1 refills | Status: DC

## 2016-10-18 ENCOUNTER — Encounter: Payer: Self-pay | Admitting: Family Medicine

## 2016-10-30 ENCOUNTER — Other Ambulatory Visit: Payer: Self-pay | Admitting: Family Medicine

## 2016-11-09 ENCOUNTER — Encounter: Payer: Self-pay | Admitting: Family Medicine

## 2016-11-09 ENCOUNTER — Ambulatory Visit (INDEPENDENT_AMBULATORY_CARE_PROVIDER_SITE_OTHER): Payer: BLUE CROSS/BLUE SHIELD | Admitting: Family Medicine

## 2016-11-09 VITALS — BP 128/72 | HR 86 | Temp 98.9°F | Resp 14 | Ht 67.0 in | Wt 238.0 lb

## 2016-11-09 DIAGNOSIS — J019 Acute sinusitis, unspecified: Secondary | ICD-10-CM | POA: Diagnosis not present

## 2016-11-09 DIAGNOSIS — J029 Acute pharyngitis, unspecified: Secondary | ICD-10-CM

## 2016-11-09 LAB — STREP GROUP A AG, W/REFLEX TO CULT: STREGTOCOCCUS GROUP A AG SCREEN: NOT DETECTED

## 2016-11-09 MED ORDER — FIRST-DUKES MOUTHWASH MT SUSP: OROMUCOSAL | 0 refills | Status: DC

## 2016-11-09 MED ORDER — AMOXICILLIN-POT CLAVULANATE 875-125 MG PO TABS: 1.0000 | ORAL_TABLET | Freq: Two times a day (BID) | ORAL | 0 refills | Status: DC

## 2016-11-09 MED ORDER — ALPRAZOLAM 0.25 MG PO TABS: ORAL_TABLET | ORAL | 3 refills | Status: DC

## 2016-11-09 NOTE — Addendum Note (Signed)
Addended by: Sheral Flow on: 11/09/2016 10:50 AM   Modules accepted: Orders

## 2016-11-09 NOTE — Progress Notes (Signed)
   Subjective:    Patient ID: Crystal Waters, female    DOB: [birth year], 61 y.o.   MRN: [id number]  Patient presents for Illness (sore throat, cough, post nasal drip, nasal drainage, fever/ chills- wants strep test)   Sore throat, sinus pressure, cough non productive, feels congestion in her throatFor the past few days. . Low grade fever.  No GI symptoms  Treated at [facility] a few weeks ago for strep with amoxicliin about 2 weeks ago but does not feel like her symptoms completely cleared. + sick contacts Took TYlenol/Motrin last night due some joint pain   Review Of Systems:  GEN- denies fatigue,+ fever, weight loss,weakness, recent illness HEENT- denies eye drainage, change in vision, +nasal discharge, CVS- denies chest pain, palpitations RESP- denies SOB, +cough, wheeze ABD- denies N/V, change in stools, abd pain GU- denies dysuria, hematuria, dribbling, incontinence MSK- denies joint pain, muscle aches, injury Neuro- denies headache, dizziness, syncope, seizure activity       Objective:    BP 128/72   Pulse 86   Temp 98.9 F (37.2 C) (Oral)   Resp 14   Ht 5\' 7"  (1.702 m)   Wt 238 lb (108 kg)   SpO2 98%   BMI 37.28 kg/m  GEN- NAD, alert and oriented x3 HEENT- PERRL, EOMI, non injected sclera, pink conjunctiva, MMM, oropharynx injected, , TM clear bilat no effusion,, tongue has mild thrush   + maxillary sinus tenderness, inflammed turbinates,  Nasal drainage  Neck- Supple, + LAD CVS- RRR, no murmur RESP-CTAB EXT- No edema Pulses- Radial 2+    Note unable to get a good throat swab due to gag reflex      Assessment & Plan:      Problem List Items Addressed This Visit    None    Visit Diagnoses    Pharyngitis, unspecified etiology    -  Primary   Pharyngitis not resolved, also with sinusitis symptoms, given Augmentin, magic mouthwash, nasal sterod, Can use OTC cough med, but cough from post nasal drip   Relevant Orders   STREP GROUP A AG, W/REFLEX TO CULT  (Completed)   Acute sinusitis, recurrence not specified, unspecified location       Relevant Medications   amoxicillin-clavulanate (AUGMENTIN) 875-125 MG tablet      Note: This dictation was prepared with Dragon dictation along with smaller phrase technology. Any transcriptional errors that result from this process are unintentional.

## 2016-11-09 NOTE — Patient Instructions (Addendum)
Switch nasal spray - nasonex or flonase  Take antibiotics  Use mouth rinse  Give work note for today, she is going back  F/U as needed

## 2016-11-11 LAB — CULTURE, GROUP A STREP

## 2016-12-20 ENCOUNTER — Encounter: Payer: Self-pay | Admitting: Family Medicine

## 2016-12-21 MED ORDER — ONDANSETRON HCL 4 MG PO TABS: 4.0000 mg | ORAL_TABLET | Freq: Three times a day (TID) | ORAL | 0 refills | Status: DC | PRN

## 2016-12-24 ENCOUNTER — Encounter: Payer: Self-pay | Admitting: Family Medicine

## 2016-12-24 ENCOUNTER — Ambulatory Visit (INDEPENDENT_AMBULATORY_CARE_PROVIDER_SITE_OTHER): Payer: BLUE CROSS/BLUE SHIELD | Admitting: Family Medicine

## 2016-12-24 VITALS — BP 126/70 | HR 80 | Temp 98.6°F | Resp 16 | Ht 67.0 in | Wt 238.0 lb

## 2016-12-24 DIAGNOSIS — K219 Gastro-esophageal reflux disease without esophagitis: Secondary | ICD-10-CM | POA: Diagnosis not present

## 2016-12-24 DIAGNOSIS — R1013 Epigastric pain: Secondary | ICD-10-CM | POA: Diagnosis not present

## 2016-12-24 DIAGNOSIS — R110 Nausea: Secondary | ICD-10-CM | POA: Diagnosis not present

## 2016-12-24 LAB — COMPREHENSIVE METABOLIC PANEL
AG RATIO: 1.4 (calc) (ref 1.0–2.5)
ALT: 10 U/L (ref 6–29)
AST: 12 U/L (ref 10–35)
Albumin: 3.9 g/dL (ref 3.6–5.1)
Alkaline phosphatase (APISO): 59 U/L (ref 33–130)
BUN / CREAT RATIO: 14 (calc) (ref 6–22)
BUN: 16 mg/dL (ref 7–25)
CALCIUM: 9.4 mg/dL (ref 8.6–10.4)
CO2: 28 mmol/L (ref 20–32)
Chloride: 106 mmol/L (ref 98–110)
Creat: 1.13 mg/dL — ABNORMAL HIGH (ref 0.50–0.99)
GLUCOSE: 93 mg/dL (ref 65–99)
Globulin: 2.7 g/dL (calc) (ref 1.9–3.7)
Potassium: 4.9 mmol/L (ref 3.5–5.3)
Sodium: 141 mmol/L (ref 135–146)
Total Bilirubin: 0.5 mg/dL (ref 0.2–1.2)
Total Protein: 6.6 g/dL (ref 6.1–8.1)

## 2016-12-24 LAB — CBC WITH DIFFERENTIAL/PLATELET
BASOS ABS: 30 {cells}/uL (ref 0–200)
Basophils Relative: 0.6 %
EOS ABS: 70 {cells}/uL (ref 15–500)
EOS PCT: 1.4 %
HCT: 39.6 % (ref 35.0–45.0)
HEMOGLOBIN: 13 g/dL (ref 11.7–15.5)
Lymphs Abs: 1595 cells/uL (ref 850–3900)
MCH: 29.3 pg (ref 27.0–33.0)
MCHC: 32.8 g/dL (ref 32.0–36.0)
MCV: 89.4 fL (ref 80.0–100.0)
MONOS PCT: 8.3 %
MPV: 11.4 fL (ref 7.5–12.5)
NEUTROS ABS: 2890 {cells}/uL (ref 1500–7800)
Neutrophils Relative %: 57.8 %
PLATELETS: 177 10*3/uL (ref 140–400)
RBC: 4.43 10*6/uL (ref 3.80–5.10)
RDW: 12.8 % (ref 11.0–15.0)
Total Lymphocyte: 31.9 %
WBC: 5 10*3/uL (ref 3.8–10.8)
WBCMIX: 415 {cells}/uL (ref 200–950)

## 2016-12-24 LAB — LIPASE: Lipase: 37 U/L (ref 7–60)

## 2016-12-24 NOTE — Assessment & Plan Note (Signed)
Difficult to discern but I think that her nausea and epigastric pain is more related to her reflux possible ulceration. I did do an H. pylori urea breath test today. She is status post cholecystectomy no change in bowels no vomiting otherwise. I will have her restart her Zantac and take 150 mg twice a day until we get the results of her above testing. He is to avoid ibuprofen and use just Tylenol in the Voltaren gel for now. She was also given Zofran to have on hand

## 2016-12-24 NOTE — Progress Notes (Signed)
   Subjective:    Patient ID: Crystal Waters, female    DOB: [birth year], 61 y.o.   MRN: [id number]  Patient presents for Nausea (Pain and nausea, mostly RUQ) Patient here with intermittent episodes of epigastric pain and occasional right upper quadrant pain. She is more concerned however with the severe bouts of nausea that she has been getting the past few weeks. She denies any vomiting or change in her bowels she's always had some mild constipation which she takes fiber for. The nausea since a hit her out of nowhere. She tried cutting back on her fact that she started had cholecystectomy but she has not done any particular pattern in association with her nausea. She does have history of reflux but only takes Zantac as needed. She does admit to a lot of belching and occasionally actually feels acid coming up but this is rare. She has been taking more Motrin because of her knee pain and increased activity at work. She also takes Tylenol uses Voltaren gel No fever no UTI symptoms, no CP, no weight loss   Review Of Systems:  GEN- denies fatigue, fever, weight loss,weakness, recent illness HEENT- denies eye drainage, change in vision, nasal discharge, CVS- denies chest pain, palpitations RESP- denies SOB, cough, wheeze ABD- + N/ denies V, change in stools, abd pain GU- denies dysuria, hematuria, dribbling, incontinence MSK- denies joint pain, muscle aches, injury Neuro- denies headache, dizziness, syncope, seizure activity       Objective:    BP 126/70   Pulse 80   Temp 98.6 F (37 C) (Oral)   Resp 16   Ht 5\' 7"  (1.702 m)   Wt 238 lb (108 kg)   BMI 37.28 kg/m  GEN- NAD, alert and oriented x3,well appearing  HEENT- PERRL, EOMI, non injected sclera, pink conjunctiva, MMM, oropharynx clear CVS- RRR, no murmur RESP-CTAB ABD-NABS,soft,NT,ND, NT RUQ, no rebound, no guarding EXT- No edema Pulses- Radial  2+        Assessment & Plan:      Problem List Items Addressed This Visit       Unprioritized   GERD (gastroesophageal reflux disease)    Difficult to discern but I think that her nausea and epigastric pain is more related to her reflux possible ulceration. I did do an H. pylori urea breath test today. She is status post cholecystectomy no change in bowels no vomiting otherwise. I will have her restart her Zantac and take 150 mg twice a day until we get the results of her above testing. He is to avoid ibuprofen and use just Tylenol in the Voltaren gel for now. She was also given Zofran to have on hand       Relevant Orders   CBC with Differential/Platelet   Comprehensive metabolic panel   Lipase   H. pylori breath test    Other Visit Diagnoses    Nausea    -  Primary   Relevant Orders   Lipase   Epigastric pain       Relevant Orders   CBC with Differential/Platelet   Comprehensive metabolic panel   Lipase   H. pylori breath test      Note: This dictation was prepared with Dragon dictation along with smaller phrase technology. Any transcriptional errors that result from this process are unintentional.

## 2016-12-24 NOTE — Patient Instructions (Signed)
Take the zantac 150mg  twice a day  Hold the motrin/advil/aleve Use the ointment and tylenol only for now F/U pending results

## 2016-12-25 ENCOUNTER — Other Ambulatory Visit: Payer: Self-pay | Admitting: Family Medicine

## 2016-12-25 LAB — H. PYLORI BREATH TEST: H. pylori Breath Test: DETECTED — AB

## 2016-12-25 MED ORDER — OMEPRAZOLE 20 MG PO CPDR: 20.0000 mg | DELAYED_RELEASE_CAPSULE | Freq: Two times a day (BID) | ORAL | 0 refills | Status: DC

## 2017-01-20 ENCOUNTER — Encounter: Payer: Self-pay | Admitting: Family Medicine

## 2017-01-20 DIAGNOSIS — K219 Gastro-esophageal reflux disease without esophagitis: Secondary | ICD-10-CM

## 2017-01-20 DIAGNOSIS — B9681 Helicobacter pylori [H. pylori] as the cause of diseases classified elsewhere: Secondary | ICD-10-CM

## 2017-01-20 DIAGNOSIS — K279 Peptic ulcer, site unspecified, unspecified as acute or chronic, without hemorrhage or perforation: Secondary | ICD-10-CM

## 2017-01-21 ENCOUNTER — Encounter: Payer: Self-pay | Admitting: Physician Assistant

## 2017-01-21 MED ORDER — RANITIDINE HCL 150 MG PO CAPS: 150.0000 mg | ORAL_CAPSULE | Freq: Every evening | ORAL | 1 refills | Status: DC

## 2017-01-21 MED ORDER — OMEPRAZOLE 40 MG PO CPDR: 40.0000 mg | DELAYED_RELEASE_CAPSULE | Freq: Every day | ORAL | 3 refills | Status: DC

## 2017-02-04 ENCOUNTER — Other Ambulatory Visit: Payer: BLUE CROSS/BLUE SHIELD

## 2017-02-04 ENCOUNTER — Ambulatory Visit (INDEPENDENT_AMBULATORY_CARE_PROVIDER_SITE_OTHER): Payer: BLUE CROSS/BLUE SHIELD | Admitting: Physician Assistant

## 2017-02-04 ENCOUNTER — Encounter: Payer: Self-pay | Admitting: Physician Assistant

## 2017-02-04 VITALS — BP 128/72 | HR 72 | Ht 66.5 in | Wt 242.0 lb

## 2017-02-04 DIAGNOSIS — A048 Other specified bacterial intestinal infections: Secondary | ICD-10-CM | POA: Diagnosis not present

## 2017-02-04 DIAGNOSIS — K6389 Other specified diseases of intestine: Secondary | ICD-10-CM | POA: Diagnosis not present

## 2017-02-04 DIAGNOSIS — K5730 Diverticulosis of large intestine without perforation or abscess without bleeding: Secondary | ICD-10-CM | POA: Diagnosis not present

## 2017-02-04 DIAGNOSIS — K219 Gastro-esophageal reflux disease without esophagitis: Secondary | ICD-10-CM | POA: Diagnosis not present

## 2017-02-04 DIAGNOSIS — R142 Eructation: Secondary | ICD-10-CM

## 2017-02-04 MED ORDER — RANITIDINE HCL 150 MG PO TABS: 150.0000 mg | ORAL_TABLET | Freq: Two times a day (BID) | ORAL | 11 refills | Status: DC

## 2017-02-04 MED ORDER — RIFAXIMIN 550 MG PO TABS: 550.0000 mg | ORAL_TABLET | Freq: Three times a day (TID) | ORAL | 0 refills | Status: AC

## 2017-02-04 NOTE — Patient Instructions (Addendum)
We have sent the following medications to your pharmacy for you to pick up at your convenience: [facility] [address]  1. Zantac 150 mg   We sent prescription for the Xifaxan 550 mg to [facility]. They will do the prior authorization for the medication. They will call you before delivering the medication.  Try Gas-X with meals.  We have provided you with the antireflux regimine information.  Come to our lab December 27th for a stool test kit. You will have to be off the Zantac for 10 days before you give a stool sample.  

## 2017-02-04 NOTE — Progress Notes (Signed)
Subjective:    Patient ID: Crystal Waters, female    DOB: 05-05-1955, 61 y.o.   MRN: [id number]  HPI Crystal Waters is a pleasant 61 year old white female, new to GI today referred by Dr Cleatrice Burke for evaluation of chronic reflux, belching and recent diagnosis of H. Pylori. Patient states that she has had prior colonoscopy which was done in [place] in 2011. We have copy of that report which was done for screening and showed no polyps, she did have a few smallmouth diverticuli in the sigmoid colon. She is not sure that she's had prior EGD. She did have lap cholecystectomy done in January 2297 which was complicated by readmission for small bowel obstruction. He says that she has had intermittent problems with acid reflux type symptoms over the past few years but has not been on any regular medication. When she was seen by primary care recently was complaining of indigestion and a lot of persistent belching. She was started on omeprazole 40 mg by mouth every morning and Zantac 150 mg at bedtime she says since she's been taking these medicines over the past few weeks she has not had any heartburn or indigestion. She has continued with the belching. H. pylori antibody was checked and was positive and she was treated with a course of Pylera which she has completed. Patient states that while she was on the Pylera she felt better and had no GI symptoms including no burping or belching. She also did not have any pain in her knees which have been bothering her for quite a while. After coming back off the antibiotics her burping and belching has returned. Appetite is good weight has been stable, no dysphagia to solids or liquids. She says that she frequently feels as if she has a "gas bubble" in her chest and seems to be relieved by belching. She also has had some ongoing problems with bloating. Patient has been gluten-free for over 20 years. She says she had multiple problems with gluten including chronic  headaches, and rash. She was never formally tested for celiac disease. She also has a son with autism who does much better off of gluten as well. Family history is negative for GI diseases far she is aware.  Review of Systems Pertinent positive and negative review of systems were noted in the above HPI section.  All other review of systems was otherwise negative.  Outpatient Encounter Medications as of 02/04/2017  Medication Sig  . acetaminophen (TYLENOL) 500 MG tablet Take 500 mg every 8 (eight) hours as needed by mouth.  . ALPRAZolam (XANAX) 0.25 MG tablet TAKE 1 TABLET BY MOUTH AT BEDTIME AS NEEDED SLEEP OR ANXIETY  . buPROPion (WELLBUTRIN XL) 300 MG 24 hr tablet TAKE 1 TABLET (300 MG TOTAL) BY MOUTH DAILY.  Marland Kitchen Cholecalciferol (VITAMIN D) 2000 UNITS tablet Take 2,000 Units by mouth daily.  . diclofenac sodium (VOLTAREN) 1 % GEL APPLY TO AFFECTED AREA 4 TIMES A DAY AS NEEDED (Patient taking differently: two times daily)  . ibuprofen (ADVIL,MOTRIN) 200 MG tablet Take 400 mg by mouth every 8 (eight) hours as needed for mild pain or moderate pain.  Marland Kitchen lisinopril (PRINIVIL,ZESTRIL) 10 MG tablet TAKE 1 TABLET (10 MG TOTAL) BY MOUTH DAILY.  Marland Kitchen loratadine (CLARITIN) 10 MG tablet Take 10 mg by mouth daily.  Marland Kitchen omeprazole (PRILOSEC) 40 MG capsule Take 1 capsule (40 mg total) daily by mouth.  . ondansetron (ZOFRAN) 4 MG tablet Take 1 tablet (4 mg total) by mouth every 8 (  eight) hours as needed for nausea or vomiting.  Marland Kitchen OVER THE COUNTER MEDICATION Nasal spay qhs  . ranitidine (ZANTAC) 150 MG capsule Take 1 capsule (150 mg total) every evening by mouth.  . thyroid (ARMOUR THYROID) 60 MG tablet TAKE 2 TABLETS BY MOUTH DAILY BEFORE BREAKFAST  . triamcinolone cream (KENALOG) 0.1 % Apply 1 application topically 2 (two) times daily. (Patient taking differently: Apply 1 application as needed topically. )  . Diphenhyd-Hydrocort-Nystatin (FIRST-DUKES MOUTHWASH) SUSP Gargle and swallow three times a day for 1 week  (Patient not taking: Reported on 12/24/2016)  . ranitidine (ZANTAC) 150 MG tablet Take 1 tablet (150 mg total) 2 (two) times daily by mouth.  . rifaximin (XIFAXAN) 550 MG TABS tablet Take 1 tablet (550 mg total) 3 (three) times daily for 14 days by mouth.   No facility-administered encounter medications on file as of 02/04/2017.    Allergies  Allergen Reactions  . Gluten Meal Rash  . Zoloft [Sertraline Hcl] Other (See Comments)    STOMACH ACHES   Patient Active Problem List   Diagnosis Date Noted  . GERD (gastroesophageal reflux disease) 12/24/2016  . Incarcerated incisional hernia 04/18/2016  . Left shoulder pain 10/03/2015  . Glucose intolerance (impaired glucose tolerance) 02/07/2015  . Atypical nevi 10/29/2013  . OA (osteoarthritis) of knee 01/15/2013  . Obesity 01/15/2013  . Essential hypertension, benign 01/15/2013  . Hypothyroidism 01/15/2013  . Chronic insomnia 01/15/2013  . Major depressive disorder 01/15/2013  . GAD (generalized anxiety disorder) 01/15/2013   Social History   Socioeconomic History  . Marital status: Divorced    Spouse name: Not on file  . Number of children: 2  . Years of education: Not on file  . Highest education level: Not on file  Social Needs  . Financial resource strain: Not on file  . Food insecurity - worry: Not on file  . Food insecurity - inability: Not on file  . Transportation needs - medical: Not on file  . Transportation needs - non-medical: Not on file  Occupational History  . Occupation: lead teller  Tobacco Use  . Smoking status: Former Research scientist (life sciences)  . Smokeless tobacco: Never Used  . Tobacco comment: social smoker- never a habit  Substance and Sexual Activity  . Alcohol use: Yes    Comment: occasionally   . Drug use: No  . Sexual activity: Not Currently  Other Topics Concern  . Not on file  Social History Narrative  . Not on file    Crystal Waters's family history includes Autism in her son; Cancer in her paternal grandmother;  Diabetes in her brother and father; Heart attack in her maternal grandfather; Heart disease in her father and mother; Hyperlipidemia in her father; Hypertension in her father; Kidney Stones in her sister; Lupus in her maternal grandmother.      Objective:    Vitals:   02/04/17 0819  BP: 128/72  Pulse: 72    Physical Exam well-developed white female in no acute distress, pleasant blood pressure 128/72 pulse 72, height 5 foot 6, weight 242, BMI 38.4. HEENT; nontraumatic normocephalic EOMI PERRLA sclera anicteric, Cardiovascular; regular rate and rhythm with S1-S2 no murmur rub or gallop, Pulmonary; clear bilaterally, Abdomen ;obese, soft nontender nondistended bowel sounds are active she has incisional scars in the periumbilical area no palpable mass or hepatosplenomegaly, Rectal ;exam not done, Extremities ;no clubbing cyanosis or edema skin warm and dry, Neuropsych ;mood and affect appropriate       Assessment & Plan:   #  70 61 year old female with intermittent reflux symptoms over the past couple of years and ongoing problems with belching and burping. Symptoms improved on PPI and H2 blocker but burping and belching and bloating persist. Positive H. pylori breath test-patient has completed a course of Pylera. All of her GI symptoms resolved while on antibiotics then eructation and bloating recurred #3 colon cancer surveillance-up-to-date with negative colonoscopy 2011 will be due for colonoscopy 2021 #4 sigmoid diverticulosis #5 status post cholecystectomy January 0786 complicated by small bowel obstruction #6 long-term gluten intolerance, patient maintains strict gluten avoidance.  Plan; patient has read about PPIs and does not want to stay on long-term PPI therapy. We will stop omeprazole. Continue Zantac and increase 250 mg by mouth twice a day Reviewed an anti-reflux regimen and antireflux diet and patient was provided with educational materials as well She will try Gas-X  postprandially to see if this helps with gas and belching We'll give her an empiric course of Xifaxan 550 mg by mouth 3 times a day 14 days for possible small bowel bacterial overgrowth given her persistent bloating and belching. We will prove H. pylori eradication with H. pylori stool antigen which will be done in 4-6 weeks after patient completes the Xifaxan. She will need to be off of Zantac for 10 days prior to submitting stool specimen as well. We'll plan to follow-up in about 6 weeks. Patient will be established with Dr. Ardis Hughs.  Crystal Mikami Genia Harold PA-C 02/04/2017   Cc: Alycia Rossetti, MD

## 2017-02-04 NOTE — Progress Notes (Signed)
I agre with the above note, plan

## 2017-02-12 ENCOUNTER — Telehealth: Payer: Self-pay | Admitting: Physician Assistant

## 2017-02-12 ENCOUNTER — Other Ambulatory Visit: Payer: Self-pay | Admitting: Family Medicine

## 2017-02-12 NOTE — Telephone Encounter (Signed)
A user error has taken place.

## 2017-02-22 ENCOUNTER — Other Ambulatory Visit: Payer: Self-pay | Admitting: Family Medicine

## 2017-02-26 ENCOUNTER — Encounter: Payer: BLUE CROSS/BLUE SHIELD | Admitting: Family Medicine

## 2017-02-28 ENCOUNTER — Encounter: Payer: Self-pay | Admitting: Physician Assistant

## 2017-03-05 ENCOUNTER — Encounter: Payer: BLUE CROSS/BLUE SHIELD | Admitting: Family Medicine

## 2017-03-05 ENCOUNTER — Ambulatory Visit (INDEPENDENT_AMBULATORY_CARE_PROVIDER_SITE_OTHER): Payer: BLUE CROSS/BLUE SHIELD | Admitting: *Deleted

## 2017-03-05 DIAGNOSIS — Z23 Encounter for immunization: Secondary | ICD-10-CM | POA: Diagnosis not present

## 2017-03-08 ENCOUNTER — Other Ambulatory Visit: Payer: BLUE CROSS/BLUE SHIELD

## 2017-03-08 DIAGNOSIS — K6389 Other specified diseases of intestine: Secondary | ICD-10-CM | POA: Diagnosis not present

## 2017-03-10 LAB — H. PYLORI ANTIGEN, STOOL: H pylori Ag, Stl: NEGATIVE

## 2017-03-13 ENCOUNTER — Other Ambulatory Visit: Payer: Self-pay | Admitting: Family Medicine

## 2017-03-25 ENCOUNTER — Other Ambulatory Visit: Payer: Self-pay | Admitting: Family Medicine

## 2017-03-25 DIAGNOSIS — Z1231 Encounter for screening mammogram for malignant neoplasm of breast: Secondary | ICD-10-CM

## 2017-03-27 ENCOUNTER — Other Ambulatory Visit: Payer: Self-pay | Admitting: *Deleted

## 2017-03-27 MED ORDER — DICLOFENAC SODIUM 1 % TD GEL: TRANSDERMAL | 2 refills | Status: DC

## 2017-03-28 ENCOUNTER — Telehealth: Payer: Self-pay | Admitting: *Deleted

## 2017-03-28 MED ORDER — DICLOFENAC SODIUM 1 % TD GEL: TRANSDERMAL | 2 refills | Status: DC

## 2017-03-28 NOTE — Telephone Encounter (Signed)
Received request from pharmacy for PA on Diclofenac.   PA submitted.   Dx: M17.0  Received immediate response.   Case ID [id number] Approved 03/19/2017- 04/18/2018.  Pharmacy made aware.

## 2017-04-11 ENCOUNTER — Ambulatory Visit
Admission: RE | Admit: 2017-04-11 | Discharge: 2017-04-11 | Disposition: A | Discharge status: Home / Self Care | Payer: BLUE CROSS/BLUE SHIELD | Source: Ambulatory Visit | Attending: Family Medicine | Admitting: Family Medicine

## 2017-04-11 DIAGNOSIS — Z1231 Encounter for screening mammogram for malignant neoplasm of breast: Secondary | ICD-10-CM

## 2017-04-24 ENCOUNTER — Encounter: Payer: Self-pay | Admitting: Family Medicine

## 2017-04-24 ENCOUNTER — Other Ambulatory Visit: Payer: Self-pay

## 2017-04-24 ENCOUNTER — Ambulatory Visit (INDEPENDENT_AMBULATORY_CARE_PROVIDER_SITE_OTHER): Payer: BLUE CROSS/BLUE SHIELD | Admitting: Family Medicine

## 2017-04-24 VITALS — BP 120/68 | HR 86 | Temp 98.4°F | Resp 16 | Ht 66.5 in | Wt 240.0 lb

## 2017-04-24 DIAGNOSIS — Z6838 Body mass index (BMI) 38.0-38.9, adult: Secondary | ICD-10-CM

## 2017-04-24 DIAGNOSIS — Z124 Encounter for screening for malignant neoplasm of cervix: Secondary | ICD-10-CM

## 2017-04-24 DIAGNOSIS — J069 Acute upper respiratory infection, unspecified: Secondary | ICD-10-CM

## 2017-04-24 DIAGNOSIS — E038 Other specified hypothyroidism: Secondary | ICD-10-CM | POA: Diagnosis not present

## 2017-04-24 DIAGNOSIS — Z0000 Encounter for general adult medical examination without abnormal findings: Secondary | ICD-10-CM | POA: Diagnosis not present

## 2017-04-24 DIAGNOSIS — I10 Essential (primary) hypertension: Secondary | ICD-10-CM

## 2017-04-24 DIAGNOSIS — F5104 Psychophysiologic insomnia: Secondary | ICD-10-CM | POA: Diagnosis not present

## 2017-04-24 DIAGNOSIS — F3342 Major depressive disorder, recurrent, in full remission: Secondary | ICD-10-CM

## 2017-04-24 DIAGNOSIS — R7302 Impaired glucose tolerance (oral): Secondary | ICD-10-CM

## 2017-04-24 NOTE — Progress Notes (Signed)
   Subjective:    Patient ID: Crystal Waters, female    DOB: Jan 07, 1956, 62 y.o.   MRN: [id number]  Patient presents for CPE with PAP (is fasting) and Illness (x5 days- cough, sneezing, hoarseness)  Pt here for CPE  Medications and History reviewed  Colonoscopy- UTD Mammogram-UTD PAP Smear - last done in  2016, due for repeat  Immunizations- UTD, declines shingles vaccine   Hypothyroidism, on armour thyroid  HTN- taking lisinopril without difficulty   MDD/GAD- has been stable on wellbutrin for years , no concenrs with medication, has xanax prn   PHQ-9 Score 8, tired, no energy major complain    Cough congestion, hoarse voice for past 5 days, the week before had stomach bug. Took mucinex yesterday  Took cough syrup , takes olive leaf and nettle tea ,starting to feel better     Review Of Systems:  GEN- + fatigue, fever, weight loss,weakness, recent illness HEENT- denies eye drainage, change in vision, +nasal discharge, CVS- denies chest pain, palpitations RESP- denies SOB, +cough, wheeze ABD- denies N/V, change in stools, abd pain GU- denies dysuria, hematuria, dribbling, incontinence MSK- denies joint pain, muscle aches, injury Neuro- denies headache, dizziness, syncope, seizure activity       Objective:    BP 120/68   Pulse 86   Temp 98.4 F (36.9 C) (Oral)   Resp 16   Ht 5' 6.5" (1.689 m)   Wt 240 lb (108.9 kg)   SpO2 98%   BMI 38.16 kg/m  GEN- NAD, alert and oriented x3   Waist 41" HEENT- PERRL, EOMI, non injected sclera, pink conjunctiva, MMM, oropharynx clear, TM Clear bilat, clear nasal drainage  Neck- Supple, no thyromegaly CVS- RRR, no murmur RESP-CTAB ABD-NABS,soft,NT,ND GU- normal external genitalia, vaginal mucosa pink and moist, cervix visualized no growth, no blood form os, no discharge, no CMT, no ovarian masses, uterus normal size, FOBT neg, normal rectal tone  EXT- No edema Pulses- Radial, DP- 2+        Assessment & Plan:      Problem  List Items Addressed This Visit      Unprioritized   Glucose intolerance (impaired glucose tolerance)   Relevant Orders   Hemoglobin A1c   Chronic insomnia   Obesity   Major depressive disorder    Controlled on wellbutrin and xanax       Hypothyroidism    Check TFT      Relevant Orders   TSH   T3, free   T4, free   Essential hypertension, benign    Controlled no changes       Relevant Orders   Lipid panel    Other Visit Diagnoses    Routine general medical examination at a health care facility    -  Primary   CPE done, PAP Smear done. Declines shingles vaccine, fasting labs. Continue to work on healthy eating, weight loss. FU eye doctor/dentist   Relevant Orders   CBC with Differential/Platelet   Comprehensive metabolic panel   Lipid panel   Cervical cancer screening       Relevant Orders   Pap IG w/ reflex to HPV when ASC-U   Viral URI       Continue with vitamin C OTC meds as needed, improving       Note: This dictation was prepared with Dragon dictation along with smaller phrase technology. Any transcriptional errors that result from this process are unintentional.

## 2017-04-24 NOTE — Patient Instructions (Addendum)
I recommend eye visit once a year I recommend dental visit every 6 months Goal is to  Exercise 30 minutes 5 days a week We will send a letter with lab results  F/U 6 months  

## 2017-04-24 NOTE — Assessment & Plan Note (Signed)
Controlled on wellbutrin and xanax

## 2017-04-24 NOTE — Assessment & Plan Note (Signed)
Check TFT  

## 2017-04-24 NOTE — Assessment & Plan Note (Signed)
Controlled no changes 

## 2017-04-25 LAB — CBC WITH DIFFERENTIAL/PLATELET
Basophils Absolute: 39 cells/uL (ref 0–200)
Basophils Relative: 0.9 %
EOS PCT: 3 %
Eosinophils Absolute: 129 cells/uL (ref 15–500)
HCT: 42.3 % (ref 35.0–45.0)
Hemoglobin: 13.9 g/dL (ref 11.7–15.5)
Lymphs Abs: 1832 cells/uL (ref 850–3900)
MCH: 29.8 pg (ref 27.0–33.0)
MCHC: 32.9 g/dL (ref 32.0–36.0)
MCV: 90.8 fL (ref 80.0–100.0)
MPV: 11.6 fL (ref 7.5–12.5)
Monocytes Relative: 9.4 %
NEUTROS PCT: 44.1 %
Neutro Abs: 1896 cells/uL (ref 1500–7800)
PLATELETS: 184 10*3/uL (ref 140–400)
RBC: 4.66 10*6/uL (ref 3.80–5.10)
RDW: 12.9 % (ref 11.0–15.0)
TOTAL LYMPHOCYTE: 42.6 %
WBC: 4.3 10*3/uL (ref 3.8–10.8)
WBCMIX: 404 {cells}/uL (ref 200–950)

## 2017-04-25 LAB — COMPREHENSIVE METABOLIC PANEL
AG Ratio: 1.5 (calc) (ref 1.0–2.5)
ALBUMIN MSPROF: 4.1 g/dL (ref 3.6–5.1)
ALKALINE PHOSPHATASE (APISO): 55 U/L (ref 33–130)
ALT: 14 U/L (ref 6–29)
AST: 15 U/L (ref 10–35)
BUN: 17 mg/dL (ref 7–25)
CO2: 28 mmol/L (ref 20–32)
Calcium: 9.3 mg/dL (ref 8.6–10.4)
Chloride: 106 mmol/L (ref 98–110)
Creat: 0.92 mg/dL (ref 0.50–0.99)
Globulin: 2.7 g/dL (calc) (ref 1.9–3.7)
Glucose, Bld: 91 mg/dL (ref 65–99)
POTASSIUM: 4.5 mmol/L (ref 3.5–5.3)
Sodium: 140 mmol/L (ref 135–146)
Total Bilirubin: 0.4 mg/dL (ref 0.2–1.2)
Total Protein: 6.8 g/dL (ref 6.1–8.1)

## 2017-04-25 LAB — LIPID PANEL
CHOL/HDL RATIO: 3.1 (calc) (ref <5.0)
CHOLESTEROL: 229 mg/dL — AB (ref <200)
HDL: 75 mg/dL (ref >50)
LDL Cholesterol (Calc): 129 mg/dL (calc) — ABNORMAL HIGH
Non-HDL Cholesterol (Calc): 154 mg/dL (calc) — ABNORMAL HIGH (ref <130)
Triglycerides: 131 mg/dL (ref <150)

## 2017-04-25 LAB — HEMOGLOBIN A1C
Hgb A1c MFr Bld: 5.4 % of total Hgb (ref <5.7)
Mean Plasma Glucose: 108 (calc)
eAG (mmol/L): 6 (calc)

## 2017-04-25 LAB — TSH: TSH: 4.94 mIU/L — ABNORMAL HIGH (ref 0.40–4.50)

## 2017-04-25 LAB — T3, FREE: T3 FREE: 2.1 pg/mL — AB (ref 2.3–4.2)

## 2017-04-25 LAB — T4, FREE: Free T4: 0.8 ng/dL (ref 0.8–1.8)

## 2017-05-01 LAB — PAP IG W/ RFLX HPV ASCU

## 2017-05-02 ENCOUNTER — Other Ambulatory Visit: Payer: Self-pay | Admitting: *Deleted

## 2017-05-02 DIAGNOSIS — E038 Other specified hypothyroidism: Secondary | ICD-10-CM

## 2017-06-04 ENCOUNTER — Other Ambulatory Visit: Payer: Self-pay | Admitting: *Deleted

## 2017-06-04 MED ORDER — BUPROPION HCL ER (XL) 300 MG PO TB24: 300.0000 mg | ORAL_TABLET | Freq: Every day | ORAL | 3 refills | Status: DC

## 2017-06-07 ENCOUNTER — Other Ambulatory Visit: Payer: BLUE CROSS/BLUE SHIELD

## 2017-06-07 DIAGNOSIS — E038 Other specified hypothyroidism: Secondary | ICD-10-CM

## 2017-06-07 LAB — TSH: TSH: 4.59 mIU/L — ABNORMAL HIGH (ref 0.40–4.50)

## 2017-06-20 ENCOUNTER — Other Ambulatory Visit: Payer: Self-pay | Admitting: Family Medicine

## 2017-06-28 ENCOUNTER — Other Ambulatory Visit: Payer: Self-pay | Admitting: Family Medicine

## 2017-07-09 ENCOUNTER — Other Ambulatory Visit: Payer: Self-pay | Admitting: Family Medicine

## 2017-07-09 NOTE — Telephone Encounter (Signed)
Ok to refill??  Last office visit 04/24/2017.  Last refill 11/09/2016, #3 refills.

## 2017-08-02 ENCOUNTER — Encounter: Payer: Self-pay | Admitting: Family Medicine

## 2017-08-02 ENCOUNTER — Ambulatory Visit: Payer: BLUE CROSS/BLUE SHIELD | Admitting: Family Medicine

## 2017-08-02 ENCOUNTER — Ambulatory Visit
Admission: RE | Admit: 2017-08-02 | Discharge: 2017-08-02 | Disposition: A | Discharge status: Home / Self Care | Payer: BLUE CROSS/BLUE SHIELD | Source: Ambulatory Visit | Attending: Family Medicine | Admitting: Family Medicine

## 2017-08-02 ENCOUNTER — Other Ambulatory Visit: Payer: Self-pay

## 2017-08-02 VITALS — BP 122/62 | HR 72 | Temp 98.7°F | Resp 14 | Ht 66.5 in | Wt 248.0 lb

## 2017-08-02 DIAGNOSIS — K5900 Constipation, unspecified: Secondary | ICD-10-CM | POA: Diagnosis not present

## 2017-08-02 DIAGNOSIS — E038 Other specified hypothyroidism: Secondary | ICD-10-CM

## 2017-08-02 DIAGNOSIS — K219 Gastro-esophageal reflux disease without esophagitis: Secondary | ICD-10-CM | POA: Diagnosis not present

## 2017-08-02 DIAGNOSIS — N200 Calculus of kidney: Secondary | ICD-10-CM | POA: Diagnosis not present

## 2017-08-02 MED ORDER — OMEPRAZOLE 40 MG PO CPDR: 40.0000 mg | DELAYED_RELEASE_CAPSULE | Freq: Every day | ORAL | 3 refills | Status: DC

## 2017-08-02 NOTE — Assessment & Plan Note (Signed)
I Think Most of Her Symptoms Are Consistent with Uncontrolled Acid Reflux with the Belching pressure.  We will have her use omeprazole daily.  I do have a concern about her multiple bowel movements over the weekend it is possible that she is having smaller bowel movement because she is constipated not regular during the week although this could be also related to her gallbladder surgery.  We will check a KUB to see if she has constipation and so we will treat that with medications to relieve the stool burden.  If she indeed does not have any constipation we will get her set up with GI for evaluation.  Abdominal exam was benign today.

## 2017-08-02 NOTE — Assessment & Plan Note (Signed)
She has had some difficulty getting her Armour Thyroid at times.  She would like to speak an endocrinologist to see if there are other alternative thyroid medication she can use she does not want to use levothyroxine

## 2017-08-02 NOTE — Patient Instructions (Addendum)
KUB to be done Start omeprazole  Referral to endocrinologist  F/U pending results  [place]  [address]

## 2017-08-02 NOTE — Progress Notes (Signed)
   Subjective:    Patient ID: Crystal Waters, female    DOB: 22-Sep-1955, 62 y.o.   MRN: [id number]  Patient presents for Abd Pain (states that she has severe abd cramps- has frequent nausea- belchin)   Intermitant abdominal pain for past few weeks. Has severe gas and cramps. Has also been passing more gas. Has nausea daily uses zofran Has had pain around Upper part of abdomen, feels like a vice.  No vomiting  Took zantac for indigestion No change in diet Had gallbladder Jan 2018 On the weekend she has multiple BM- mostly loose  No pain with eating   No longer on omeprazole     Review Of Systems:  GEN- denies fatigue, fever, weight loss,weakness, recent illness HEENT- denies eye drainage, change in vision, nasal discharge, CVS- denies chest pain, palpitations RESP- denies SOB, cough, wheeze ABD- denies N/V, +change in stools, abd pain GU- denies dysuria, hematuria, dribbling, incontinence MSK- denies joint pain, muscle aches, injury Neuro- denies headache, dizziness, syncope, seizure activity       Objective:    BP 122/62   Pulse 72   Temp 98.7 F (37.1 C) (Oral)   Resp 14   Ht 5' 6.5" (1.689 m)   Wt 248 lb (112.5 kg)   SpO2 97%   BMI 39.43 kg/m  GEN- NAD, alert and oriented x3 HEENT- PERRL, EOMI, non injected sclera, pink conjunctiva, MMM, oropharynx clear CVS- RRR, no murmur RESP-CTAB ABD-NABS,soft,NT,ND EXT- No edema Pulses- Radial 2+        Assessment & Plan:      Problem List Items Addressed This Visit      Unprioritized   Hypothyroidism    She has had some difficulty getting her Armour Thyroid at times.  She would like to speak an endocrinologist to see if there are other alternative thyroid medication she can use she does not want to use levothyroxine      Relevant Orders   Ambulatory referral to Endocrinology   GERD (gastroesophageal reflux disease) - Primary    I Think Most of Her Symptoms Are Consistent with Uncontrolled Acid Reflux with  the Belching pressure.  We will have her use omeprazole daily.  I do have a concern about her multiple bowel movements over the weekend it is possible that she is having smaller bowel movement because she is constipated not regular during the week although this could be also related to her gallbladder surgery.  We will check a KUB to see if she has constipation and so we will treat that with medications to relieve the stool burden.  If she indeed does not have any constipation we will get her set up with GI for evaluation.  Abdominal exam was benign today.      Relevant Medications   omeprazole (PRILOSEC) 40 MG capsule    Other Visit Diagnoses    Constipation, unspecified constipation type       Relevant Orders   DG Abd 1 View (Completed)      Note: This dictation was prepared with Dragon dictation along with smaller phrase technology. Any transcriptional errors that result from this process are unintentional.

## 2017-08-14 ENCOUNTER — Telehealth: Payer: Self-pay | Admitting: Family Medicine

## 2017-08-14 NOTE — Telephone Encounter (Signed)
Received fax from [place] for [employer] forms.   Call placed to patient for more information.   Call placed to patient. [place].

## 2017-08-14 NOTE — Telephone Encounter (Signed)
Patient returned call.  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   Forms routed to provider.

## 2017-08-14 NOTE — Telephone Encounter (Signed)
Received fmla ppw from patients employer on 08/14/2017 Will give this paperwork to christina on 05/29

## 2017-08-22 ENCOUNTER — Encounter: Payer: Self-pay | Admitting: Family Medicine

## 2017-09-17 ENCOUNTER — Encounter: Payer: Self-pay | Admitting: Family Medicine

## 2017-09-17 DIAGNOSIS — K219 Gastro-esophageal reflux disease without esophagitis: Secondary | ICD-10-CM

## 2017-09-17 MED ORDER — SUCRALFATE 1 G PO TABS: 1.0000 g | ORAL_TABLET | Freq: Three times a day (TID) | ORAL | 3 refills | Status: DC

## 2017-09-18 ENCOUNTER — Ambulatory Visit: Payer: BLUE CROSS/BLUE SHIELD | Admitting: "Endocrinology

## 2017-09-18 ENCOUNTER — Encounter: Payer: Self-pay | Admitting: "Endocrinology

## 2017-09-18 VITALS — BP 110/74 | HR 62 | Ht 66.5 in | Wt 244.0 lb

## 2017-09-18 DIAGNOSIS — E890 Postprocedural hypothyroidism: Secondary | ICD-10-CM | POA: Diagnosis not present

## 2017-09-18 DIAGNOSIS — Z85850 Personal history of malignant neoplasm of thyroid: Secondary | ICD-10-CM | POA: Diagnosis not present

## 2017-09-18 MED ORDER — LEVOTHYROXINE SODIUM 150 MCG PO CAPS: 150.0000 ug | ORAL_CAPSULE | Freq: Every day | ORAL | 3 refills | Status: DC

## 2017-09-18 MED ORDER — LEVOTHYROXINE SODIUM 125 MCG PO CAPS: 125.0000 ug | ORAL_CAPSULE | Freq: Every day | ORAL | 3 refills | Status: DC

## 2017-09-18 NOTE — Progress Notes (Signed)
Endocrinology Consult Note                                         09/18/2017, 8:57 AM   Crystal Waters is a 61 y.o.-year-old female patient being seen in consultation for hypothyroidism referred by Crystal Rossetti, MD.   Past Medical History:  Diagnosis Date  . Allergy    gluten, seasonal  . Anxiety   . Arthritis   . Asthma    seasonal   . Cancer ([place])    Thyroid  . Complication of anesthesia   . Constipation   . Depression   . GERD (gastroesophageal reflux disease)   . Gestational diabetes mellitus    with 1 child.    . H/O nose injury    accidently hit in the nose, has a stuffy nose, can't lie flat, wears nasal strips at night  . History of kidney stones      x 2 passed  . Hypertension   . Hypothyroidism   . PONV (postoperative nausea and vomiting)   . PTSD (post-traumatic stress disorder)   . Thyroid cancer ([place])   . Thyroid disease    Past Surgical History:  Procedure Laterality Date  . CHOLECYSTECTOMY N/A 04/11/2016   Procedure: LAPAROSCOPIC CHOLECYSTECTOMY WITH INTRAOPERATIVE CHOLANGIOGRAM;  Surgeon: Crystal Skeans, MD;  Location: [facility];  Service: General;  Laterality: N/A;  . COLONOSCOPY    . KNEE SURGERY Right 2009  . LAPAROTOMY N/A 04/17/2016   Procedure: LAPAROTOMY REPAIR OF INCARCERATED INCISIONAL HERNIA WITH VAC PLACEMENT;  Surgeon: Crystal Bookbinder, MD;  Location: [facility];  Service: General;  Laterality: N/A;  . THYROID SURGERY     2001  . TUBAL LIGATION     Social History   Socioeconomic History  . Marital status: Divorced    Spouse name: Not on file  . Number of children: 2  . Years of education: Not on file  . Highest education level: Not on file  Occupational History  . Occupation: Field seismologist  Social Needs  . Financial resource strain: Not on file  . Food insecurity:    Worry: Not on file    Inability: Not on file  . Transportation needs:    Medical: Not on  file    Non-medical: Not on file  Tobacco Use  . Smoking status: Former Research scientist (life sciences)  . Smokeless tobacco: Never Used  . Tobacco comment: social smoker- never a habit  Substance and Sexual Activity  . Alcohol use: Yes    Comment: occasionally   . Drug use: No  . Sexual activity: Not Currently  Lifestyle  . Physical activity:    Days per week: Not on file    Minutes per session: Not on file  . Stress: Not on file  Relationships  . Social connections:    Talks on phone: Not on file    Gets together: Not on file    Attends religious service: Not on file  Active member of club or organization: Not on file    Attends meetings of clubs or organizations: Not on file    Relationship status: Not on file  Other Topics Concern  . Not on file  Social History Narrative  . Not on file   Outpatient Encounter Medications as of 09/18/2017  Medication Sig  . ALPRAZolam (XANAX) 0.25 MG tablet TAKE 1 TABLET AT BEDTIME AS NEEDED SLEEP/ANXIETY  . buPROPion (WELLBUTRIN XL) 300 MG 24 hr tablet Take 1 tablet (300 mg total) by mouth daily.  Crystal Waters lisinopril (PRINIVIL,ZESTRIL) 10 MG tablet TAKE 1 TABLET (10 MG TOTAL) BY MOUTH DAILY.  Crystal Waters omeprazole (PRILOSEC) 40 MG capsule Take 1 capsule (40 mg total) by mouth daily.  . ondansetron (ZOFRAN) 4 MG tablet Take 1 tablet (4 mg total) by mouth every 8 (eight) hours as needed for nausea or vomiting.  . [DISCONTINUED] thyroid (ARMOUR THYROID) 60 MG tablet TAKE 2 TABLETS BY MOUTH DAILY BEFORE BREAKFAST  . acetaminophen (TYLENOL) 500 MG tablet Take 500 mg every 8 (eight) hours as needed by mouth.  . Cholecalciferol (VITAMIN D) 2000 UNITS tablet Take 2,000 Units by mouth daily.  . Diphenhyd-Hydrocort-Nystatin (FIRST-DUKES MOUTHWASH) SUSP Gargle and swallow three times a day for 1 week  . ibuprofen (ADVIL,MOTRIN) 200 MG tablet Take 400 mg by mouth every 8 (eight) hours as needed for mild pain or moderate pain.  . Levothyroxine Sodium (TIROSINT) 150 MCG CAPS Take 1 capsule  (150 mcg total) by mouth daily before breakfast.  . loratadine (CLARITIN) 10 MG tablet Take 10 mg by mouth daily.  Crystal Waters OVER THE COUNTER MEDICATION Nasal spay qhs  . ranitidine (ZANTAC) 150 MG capsule Take 1 capsule (150 mg total) every evening by mouth.  . sucralfate (CARAFATE) 1 g tablet Take 1 tablet (1 g total) by mouth 3 (three) times daily with meals.  . triamcinolone cream (KENALOG) 0.1 % Apply 1 application topically 2 (two) times daily. (Patient taking differently: Apply 1 application as needed topically. )  . [DISCONTINUED] diclofenac sodium (VOLTAREN) 1 % GEL APPLY TO AFFECTED AREA 4 TIMES A DAY AS NEEDED  . [DISCONTINUED] diclofenac sodium (VOLTAREN) 1 % GEL APPLY TO AFFECTED AREA 4 TIMES A DAY AS NEEDED   No facility-administered encounter medications on file as of 09/18/2017.    ALLERGIES: Allergies  Allergen Reactions  . Gluten Meal Rash  . Zoloft [Sertraline Hcl] Other (See Comments)    STOMACH ACHES   VACCINATION STATUS: Immunization History  Administered Date(s) Administered  . Influenza,inj,Quad PF,6+ Mos 11/22/2015, 03/05/2017     HPI    Crystal Waters  is a patient with the above medical history.  Her prior thyroid records are not available to review.  Her thyroid history starts in April 2001 when she underwent total thyroidectomy for thyroid malignancy.  She recalls being treated with radioactive iodine remnant ablation following her surgery.  She does not have recent thyroid imaging to review.  Was given various forms of thyroid hormone replacement over the years.  She reports intolerance to the levothyroxine, Synthroid which necessitated for her to start Armour Thyroid currently at 120 mg daily.  She did have trouble regulating her thyroid hormone blood levels, most recently showing under replacement.  -He has more or less fluctuating body weight, more recently gained 8 pounds over 4 months.  Of time.  She denies heat/cold intolerance.  She denies palpitations,  tremors, irritability. She denies family history of thyroid malignancy, however thyroid dysfunction and multiple family members including her  mother and her siblings. -She reports compliance to her Armour Thyroid, taking daily.  I reviewed patient's  thyroid tests:  Lab Results  Component Value Date   TSH 4.59 (H) 06/07/2017   TSH 4.94 (H) 04/24/2017   TSH 0.06 (L) 08/21/2016   TSH 0.51 02/21/2016   TSH 9.05 (H) 11/22/2015   TSH 1.088 04/11/2015   TSH 1.171 12/08/2014   TSH 5.686 (H) 07/21/2014   TSH 1.630 03/02/2014   TSH 1.780 08/03/2013   FREET4 0.8 04/24/2017   FREET4 1.1 08/21/2016   FREET4 0.8 02/21/2016   FREET4 0.7 (L) 11/22/2015   FREET4 0.62 (L) 12/08/2014   FREET4 0.77 (L) 07/21/2014   FREET4 0.83 08/03/2013     -She denies feeling nodules in neck, hoarseness, dysphagia/odynophagia, SOB with lying down.  No h/o radiation tx to head or neck.   I reviewed her chart and she also has a hypertension on treatment, GERD on Prilosec.    ROS:  Constitutional:  + weight gain, + fatigue, no subjective hyperthermia, no subjective hypothermia Eyes: no blurry vision, no xerophthalmia ENT: no sore throat, no nodules palpated in throat, no dysphagia/odynophagia, no hoarseness Cardiovascular: no Chest Pain, no Shortness of Breath, no palpitations, no leg swelling Respiratory: no cough, no SOB Gastrointestinal: no Nausea/Vomiting/Diarhhea Musculoskeletal: no muscle/joint aches Skin: no rashes Neurological: no tremors, no numbness, no tingling, no dizziness Psychiatric: no depression, no anxiety   Physical Exam: BP 110/74   Pulse 62   Ht 5' 6.5" (1.689 m)   Wt 244 lb (110.7 kg)   BMI 38.79 kg/m  Wt Readings from Last 3 Encounters:  09/18/17 244 lb (110.7 kg)  08/02/17 248 lb (112.5 kg)  04/24/17 240 lb (108.9 kg)    Constitutional: ++ Obese with BMI of 38.8, not in acute distress, normal state of mind Eyes: PERRLA, EOMI, no exophthalmos ENT: moist mucous  membranes, + faint thyroidectomy scar on anterior lower neck , no cervical lymphadenopathy Cardiovascular: normal precordial activity, Regular Rate and Rhythm, no Murmur/Rubs/Gallops Respiratory:  adequate breathing efforts, no gross chest deformity, Clear to auscultation bilaterally Gastrointestinal: abdomen soft, Non -tender, No distension, Bowel Sounds present Musculoskeletal: no gross deformities, strength intact in all four extremities Skin: moist, warm, no rashes Neurological: no tremor with outstretched hands, Deep tendon reflexes normal in all four extremities.   CMP ( most recent) CMP     Component Value Date/Time   NA 140 04/24/2017 1000   K 4.5 04/24/2017 1000   CL 106 04/24/2017 1000   CO2 28 04/24/2017 1000   GLUCOSE 91 04/24/2017 1000   BUN 17 04/24/2017 1000   CREATININE 0.92 04/24/2017 1000   CALCIUM 9.3 04/24/2017 1000   PROT 6.8 04/24/2017 1000   ALBUMIN 3.8 08/21/2016 0850   AST 15 04/24/2017 1000   ALT 14 04/24/2017 1000   ALKPHOS 57 08/21/2016 0850   BILITOT 0.4 04/24/2017 1000   GFRNONAA >60 04/22/2016 0327   GFRNONAA 60 04/11/2015 1656   GFRAA >60 04/22/2016 0327   GFRAA 69 04/11/2015 1656     Diabetic Labs (most recent): Lab Results  Component Value Date   HGBA1C 5.4 04/24/2017   HGBA1C 5.3 05/15/2016   HGBA1C 5.4 11/22/2015     Lipid Panel ( most recent) Lipid Panel     Component Value Date/Time   CHOL 229 (H) 04/24/2017 1000   TRIG 131 04/24/2017 1000   HDL 75 04/24/2017 1000   CHOLHDL 3.1 04/24/2017 1000   VLDL 34 (H) 05/15/2016 1143  LDLCALC 129 (H) 04/24/2017 1000       Lab Results  Component Value Date   TSH 4.59 (H) 06/07/2017   TSH 4.94 (H) 04/24/2017   TSH 0.06 (L) 08/21/2016   TSH 0.51 02/21/2016   TSH 9.05 (H) 11/22/2015   TSH 1.088 04/11/2015   TSH 1.171 12/08/2014   TSH 5.686 (H) 07/21/2014   TSH 1.630 03/02/2014   TSH 1.780 08/03/2013   FREET4 0.8 04/24/2017   FREET4 1.1 08/21/2016   FREET4 0.8 02/21/2016    FREET4 0.7 (L) 11/22/2015   FREET4 0.62 (L) 12/08/2014   FREET4 0.77 (L) 07/21/2014   FREET4 0.83 08/03/2013       ASSESSMENT: 1. Postsurgical Hypothyroidism 2. History of thyroid malignancy  PLAN:    Patient with long-standing postsurgical hypothyroidism after thyroidectomy for thyroid cancer in April 2001.  -Review of her more recent thyroid function tests are consistent with chronic underreplacement with Armour Thyroid.  He has difficulty regulating her thyroid function tests, and at times she encountered difficulty obtaining this particular thyroid hormone preparation. -These are inherent problems with Armour thyroid compared to thyroxine treatment,  and patient is willing to make the switch to synthetic thyroid hormone preparations. -Based on her current body weight, she would  require approximately 175 mcg of thyroxine.  Since she is not tolerating levothyroxine or Synthroid, I discussed and prescribed Tirosint 125 mcg p.o. every morning-subsequent doses to be adjusted based on her response.  She will be finishing her current supplies of Armour Thyroid by taking 2.5 tablets (150 mg equivalent of 95 mcg of thyroxine) before she makes the switch to Tirosint.  - We discussed about correct intake of levothyroxine, at fasting, with water, separated by at least 30 minutes from breakfast, and separated by more than 4 hours from calcium, iron, multivitamins, acid reflux medications (PPIs). -Patient is made aware of the fact that thyroid hormone replacement is needed for life, dose to be adjusted by periodic monitoring of thyroid function tests. - Will check thyroid tests before next visit: TSH, free T4   Per her description, her initial treatment for thyroid malignancy appears to be complete, however, she did not have subsequent surveillance thyroid/neck imaging.  Her treatment was completed approximately 18 years ago-she is likely in remission. -I discussed with her the need for at least 1  more surveillance thyroid/neck ultrasound and she agrees.  This study will be ordered to be done as soon as possible.   - Time spent with the patient: 45 minutes, of which >50% was spent in obtaining information about her symptoms, reviewing her previous labs, evaluations, and treatments, counseling her about her history of thyroid cancer, postsurgical hypothyroidism, and developing a plan for long term treatment as necessary.  Orson Gear participated in the discussions, expressed understanding, and voiced agreement with the above plans.  All questions were answered to her satisfaction. she is encouraged to contact clinic should she have any questions or concerns prior to her return visit.  Return in about 3 months (around 12/19/2017) for follow up with pre-visit labs, Thyroid / Neck Ultrasound.  Glade Lloyd, MD [facility] [address] [place], [place] [address] Phone: [phone]  Fax: [phone]   09/18/2017, 8:57 AM  This note was partially dictated with voice recognition software. Similar sounding words can be transcribed inadequately or may not  be corrected upon review.

## 2017-10-10 ENCOUNTER — Other Ambulatory Visit: Payer: Self-pay | Admitting: Family Medicine

## 2017-10-22 ENCOUNTER — Other Ambulatory Visit: Payer: Self-pay | Admitting: "Endocrinology

## 2017-10-22 DIAGNOSIS — C73 Malignant neoplasm of thyroid gland: Secondary | ICD-10-CM

## 2017-11-03 ENCOUNTER — Other Ambulatory Visit: Payer: Self-pay | Admitting: Family Medicine

## 2017-11-06 IMAGING — CT CT ABD-PELV W/ CM
2 of 6 series · 15 of 46 positions shown, 17 images · IV contrast (APPLIED)
Comparison: Abdominal radiographs 02/22/2016

CLINICAL DATA: Vomiting. Vomiting post cholecystectomy 6 days
prior.

EXAM:
CT ABDOMEN AND PELVIS WITH CONTRAST
TECHNIQUE: Multidetector CT imaging of the abdomen and pelvis was performed
using the standard protocol following bolus administration of
intravenous contrast.
CONTRAST:  100mL [id number] IOPAMIDOL (HK60PA-Q77) INJECTION 61%

[Series 2: abd/ pelvis 5.0 i30f 1 · axial · 0.98mm/px · z∈[+888,+1354]mm · 12 of 105 slices shown, 14 images]
[im 6/105  soft-tissue]
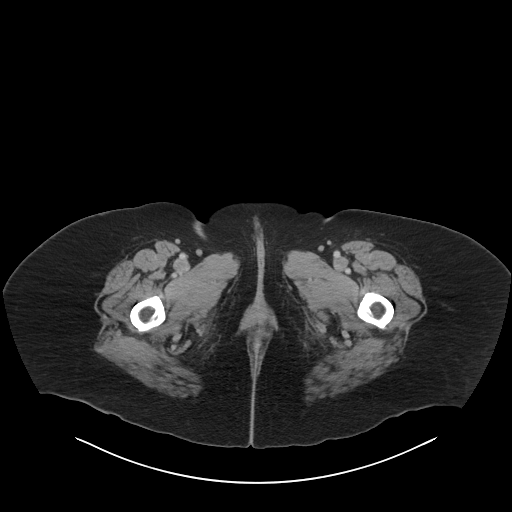
[im 6/105  bone]
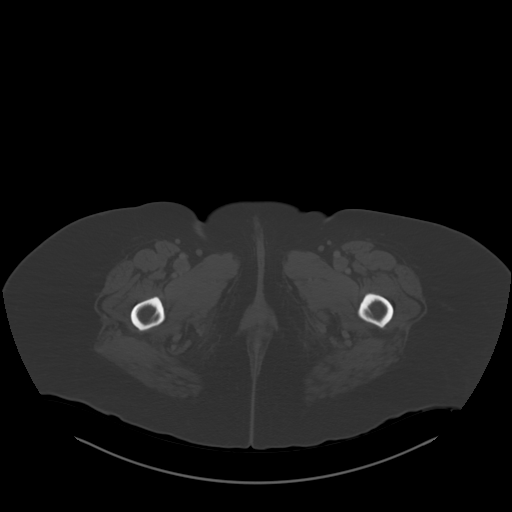
[im 17/105  soft-tissue]
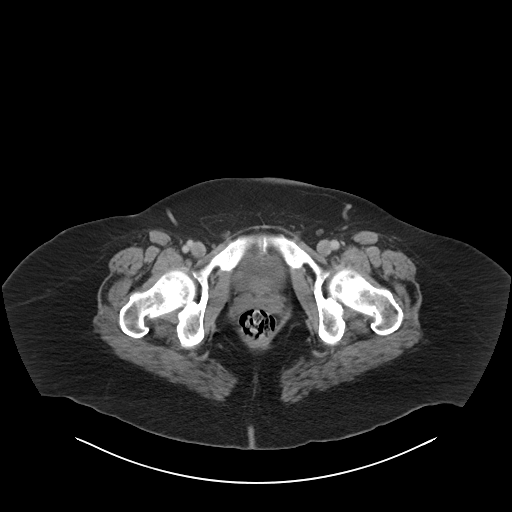
[im 22/105  soft-tissue]
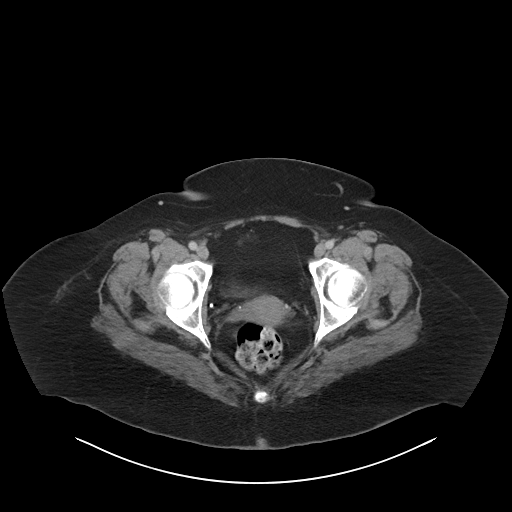
[im 33/105  soft-tissue]
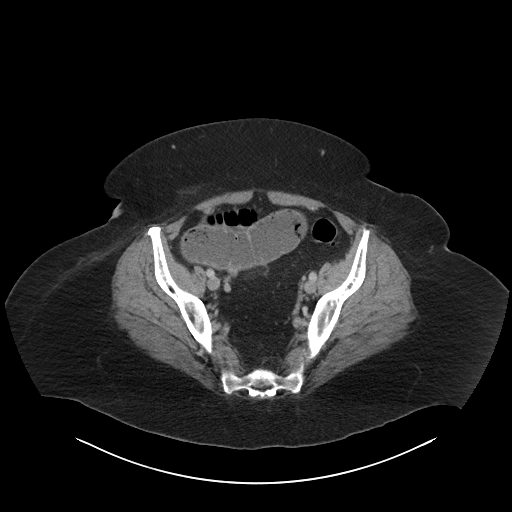
[im 39/105  soft-tissue]
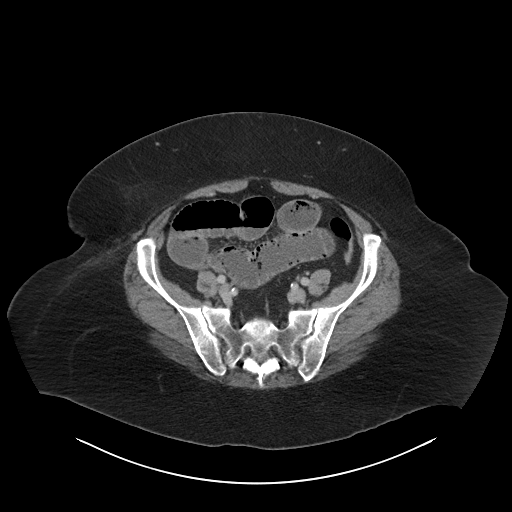
[im 50/105  soft-tissue]
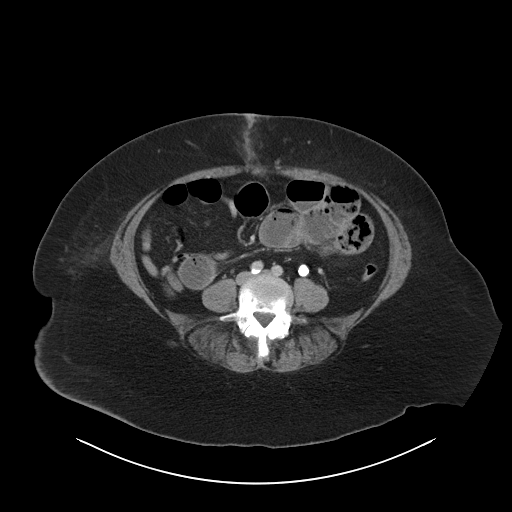
[im 55/105  soft-tissue]
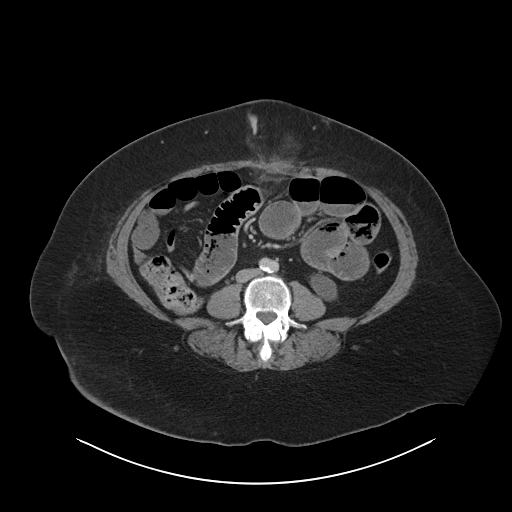
[im 66/105  soft-tissue]
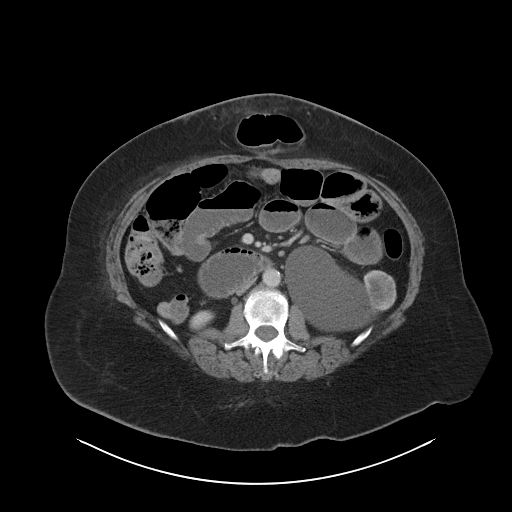
[im 72/105  soft-tissue]
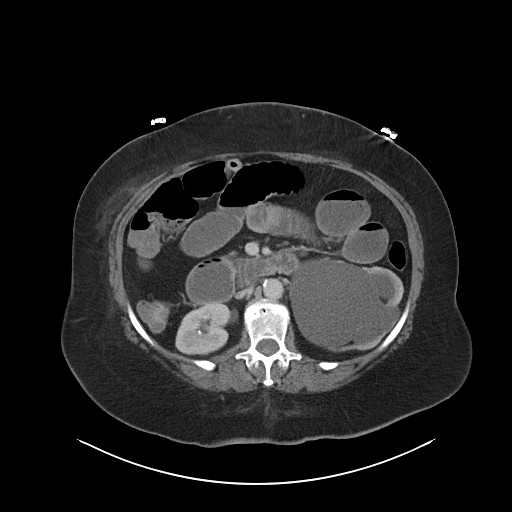
[im 72/105  bone]
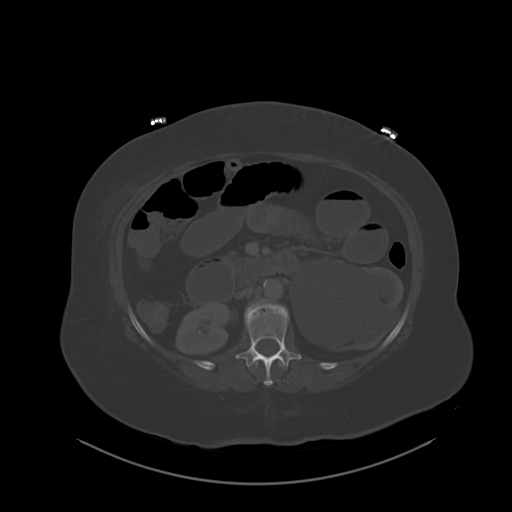
[im 83/105  soft-tissue]
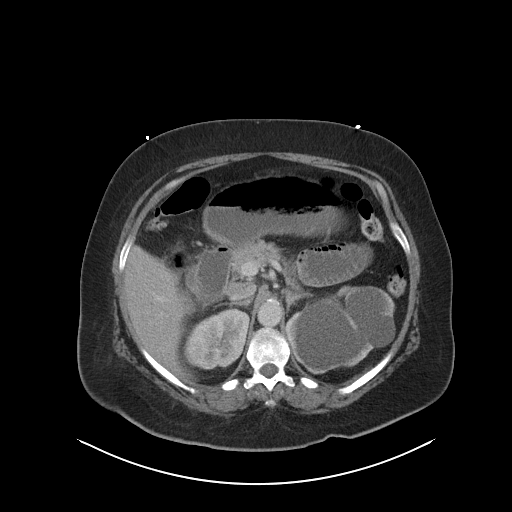
[im 88/105  soft-tissue]
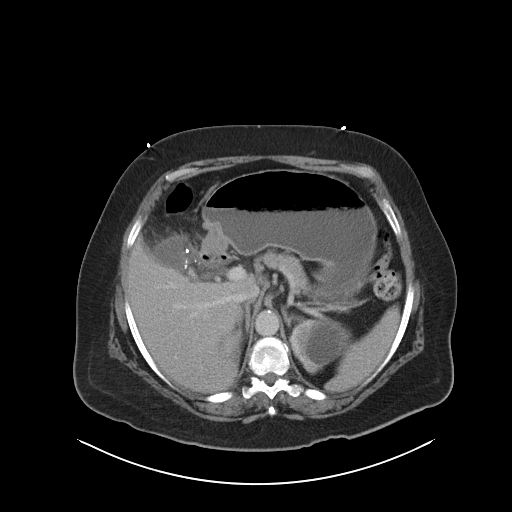
[im 99/105  soft-tissue]
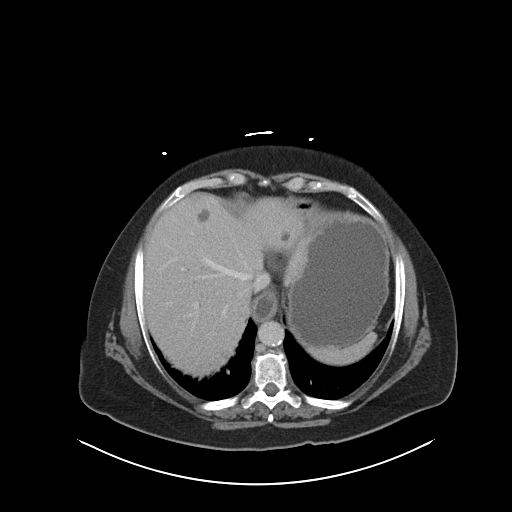

[Series 5: coronal soft tissue · coronal · 0.94mm/px · 3 of 119 slices shown]
[im 40/119  soft-tissue]
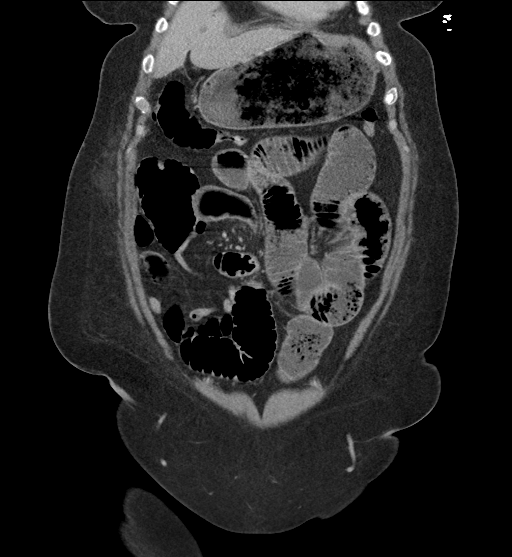
[im 53/119  soft-tissue]
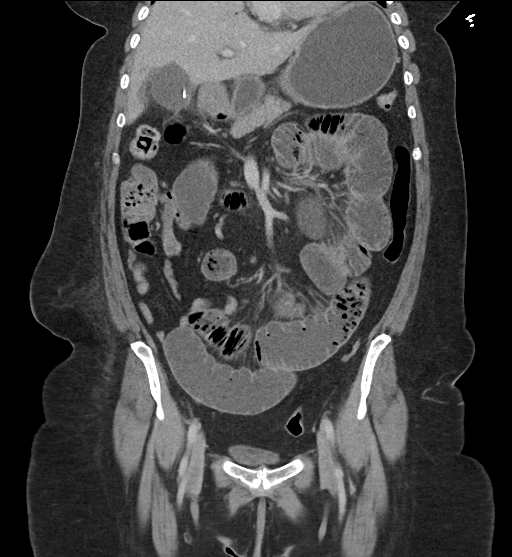
[im 66/119  soft-tissue]
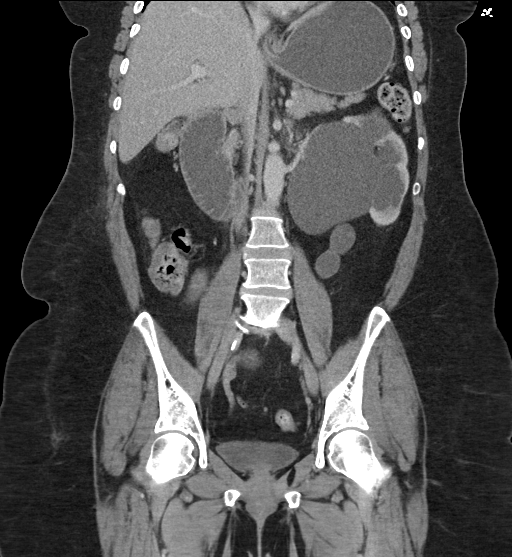

[15 of 46 positions shown; findings below may reference images not displayed]

FINDINGS: Lower chest: Mild dependent atelectasis. Fluid distends the distal
esophagus.

Hepatobiliary: Postcholecystectomy with clips in the gallbladder
fossa. There is a 5.5 x 3.3 cm fluid collection within the
gallbladder fossa with ill-defined borders. No internal air. Minimal
pneumobilia noted in the high right hepatic lobe. No common bile
duct dilatation. There is a 15 mm low-density lesion in the right
hepatic lobe. 9 mm low-density lesion in the left lobe.

Pancreas: No ductal dilatation or inflammation.

Spleen: Normal in size without focal abnormality.

Adrenals/Urinary Tract: No adrenal nodule.

Relatively severe left hydronephrosis and hydroureter, ureter is
dilated to the midportion where there is a 17 x 11 x 11 mm stone.
The more distal ureter is decompressed. Two adjacent stones
measuring 8 mm are identified in the left ureter approximately 4 cm
proximally. This is likely chronic as there is marked thinning of
the left renal parenchyma. Probable cortical cyst in the upper left
kidney. Delayed phase imaging demonstrates absent excretion from the
left kidney. There is no perinephric edema.

Two nonobstructing stones in the lower right kidney measure 10 and
12 mm respectively. There is no right hydronephrosis.

Urinary bladder is decompressed.

Stomach/Bowel: Fluid distending the distal esophagus which is
dilated. Stomach distended with fluid. Proximal small bowel bowel is
dilated and fluid-filled. Within a dilated pelvic small bowel loops
is a round 9 mm density which may be ingested material, however is
nonspecific. Small bowel transition point of a supraumbilical
ventral abdominal wall hernia which contains small bowel and
mesenteric vessels. The exiting small bowel is decompressed. More
distal small bowel is decompressed. There is no pneumatosis,
definite small bowel thickening or mesenteric edema. Normal
appendix. Stool throughout the colon without colonic inflammation or
distention.

Vascular/Lymphatic: Atherosclerosis without aneurysm. No adenopathy.

Reproductive: Uterus and bilateral adnexa are unremarkable.

Other: No intra-abdominal ascites. Other than gallbladder fossa
fluid, no intra- abdominal fluid collection. No pneumoperitoneum.
Midline ventral abdominal wall hernia neck measures 4.0 x 2.9 cm and
represents the site of small bowel obstruction. This appears just
superior to the umbilicus. Edema within the right lateral abdominal
wall at likely site of port site, no subcutaneous abscess.

Musculoskeletal: There are no acute or suspicious osseous
abnormalities. Scattered bone islands in the pelvis. Degenerative
change throughout spine.
IMPRESSION: 1. Small bowel obstruction secondary to a midline supraumbilical
ventral abdominal wall hernia. No evidence of small bowel
inflammation or perforation.
2. Small fluid collection in the gallbladder fossa measures 5.5 x
3.3 cm, may be postoperative seroma. Superimposed infection or
biloma not excluded.
3. Minimal pneumobilia is likely postsurgical.
4. Left hydroureteronephrosis secondary to a large 17 mm stone in
the mid left ureter, most certainly chronic. Chronicity suggested by
marked renal parenchymal atrophy and lack of perinephric edema.
There are additional stones within the dilated left ureter that are
nonobstructing. Nonobstructing right renal stones without right
hydronephrosis.
5. Aortic atherosclerosis.

## 2017-11-10 ENCOUNTER — Encounter: Payer: Self-pay | Admitting: Family Medicine

## 2017-11-11 ENCOUNTER — Ambulatory Visit: Payer: BLUE CROSS/BLUE SHIELD | Admitting: Physician Assistant

## 2017-11-11 ENCOUNTER — Other Ambulatory Visit: Payer: Self-pay

## 2017-11-11 ENCOUNTER — Encounter: Payer: Self-pay | Admitting: Physician Assistant

## 2017-11-11 VITALS — BP 134/72 | HR 78 | Temp 99.0°F | Resp 14 | Ht 66.5 in | Wt 241.0 lb

## 2017-11-11 DIAGNOSIS — G4700 Insomnia, unspecified: Secondary | ICD-10-CM

## 2017-11-11 DIAGNOSIS — F988 Other specified behavioral and emotional disorders with onset usually occurring in childhood and adolescence: Secondary | ICD-10-CM

## 2017-11-11 DIAGNOSIS — J029 Acute pharyngitis, unspecified: Secondary | ICD-10-CM

## 2017-11-11 DIAGNOSIS — R0982 Postnasal drip: Secondary | ICD-10-CM | POA: Diagnosis not present

## 2017-11-11 MED ORDER — CETIRIZINE HCL 10 MG PO CAPS: 1.0000 | ORAL_CAPSULE | Freq: Every day | ORAL | 3 refills | Status: DC | PRN

## 2017-11-11 MED ORDER — ZOLPIDEM TARTRATE 10 MG PO TABS: 10.0000 mg | ORAL_TABLET | Freq: Every evening | ORAL | 0 refills | Status: DC | PRN

## 2017-11-11 MED ORDER — LISDEXAMFETAMINE DIMESYLATE 50 MG PO CAPS: 50.0000 mg | ORAL_CAPSULE | Freq: Every day | ORAL | 0 refills | Status: DC
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
## 2017-11-12 ENCOUNTER — Ambulatory Visit (HOSPITAL_COMMUNITY)
Admission: RE | Admit: 2017-11-12 | Discharge: 2017-11-12 | Disposition: A | Discharge status: Home / Self Care | Payer: BLUE CROSS/BLUE SHIELD | Source: Ambulatory Visit | Attending: "Endocrinology | Admitting: "Endocrinology

## 2017-11-12 DIAGNOSIS — C73 Malignant neoplasm of thyroid gland: Secondary | ICD-10-CM | POA: Insufficient documentation

## 2017-11-12 DIAGNOSIS — E890 Postprocedural hypothyroidism: Secondary | ICD-10-CM | POA: Diagnosis not present

## 2017-11-12 DIAGNOSIS — Z85850 Personal history of malignant neoplasm of thyroid: Secondary | ICD-10-CM | POA: Diagnosis not present

## 2017-11-13 ENCOUNTER — Telehealth: Payer: Self-pay

## 2017-11-13 LAB — CULTURE, GROUP A STREP
MICRO NUMBER:: 91016215
SPECIMEN QUALITY:: ADEQUATE

## 2017-11-13 LAB — STREP GROUP A AG, W/REFLEX TO CULT: Streptococcus, Group A Screen (Direct): NOT DETECTED

## 2017-11-13 NOTE — Telephone Encounter (Signed)
Prior Authorization required for Vyvanse  Approvedtoday CaseId:[id number];Status:Approved;Review Type:Prior Auth;Coverage Start Date:10/14/2017;Coverage End Date:11/13/2018;3   Pharmacy is aware

## 2017-11-27 ENCOUNTER — Ambulatory Visit: Payer: BLUE CROSS/BLUE SHIELD | Admitting: Physician Assistant

## 2017-11-27 ENCOUNTER — Encounter: Payer: Self-pay | Admitting: Physician Assistant

## 2017-11-27 VITALS — BP 118/82 | HR 88 | Temp 98.0°F | Resp 16 | Ht 67.0 in | Wt 244.0 lb

## 2017-11-27 DIAGNOSIS — F5104 Psychophysiologic insomnia: Secondary | ICD-10-CM

## 2017-11-27 DIAGNOSIS — F988 Other specified behavioral and emotional disorders with onset usually occurring in childhood and adolescence: Secondary | ICD-10-CM
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
## 2017-12-03 ENCOUNTER — Ambulatory Visit: Payer: BLUE CROSS/BLUE SHIELD | Admitting: Gastroenterology

## 2017-12-03 ENCOUNTER — Encounter: Payer: Self-pay | Admitting: Gastroenterology

## 2017-12-03 VITALS — BP 138/82 | HR 67 | Ht 66.5 in | Wt 241.0 lb

## 2017-12-03 DIAGNOSIS — K219 Gastro-esophageal reflux disease without esophagitis: Secondary | ICD-10-CM
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
## 2017-12-03 NOTE — Patient Instructions (Signed)
Please start Zantac 150mg . Take one tab every morning, And ane tab at bedtime  Call us in 5-6 weeks to let us know how you are doing.  Normal BMI (Body Mass Index- based on height and weight) is between 19 and 25. Your BMI today is Body mass index is 38.32 kg/m. Marland Kitchen Please consider follow up  regarding your BMI with your Primary Care Provider.

## 2017-12-16 DIAGNOSIS — E890 Postprocedural hypothyroidism: Secondary | ICD-10-CM | POA: Diagnosis not present

## 2017-12-17 LAB — T4, FREE: Free T4: 1.4 ng/dL (ref 0.8–1.8)

## 2017-12-17 LAB — TSH: TSH: 7.54 mIU/L — ABNORMAL HIGH (ref 0.40–4.50)

## 2017-12-17 LAB — T3, FREE: T3, Free: 2.5 pg/mL (ref 2.3–4.2)

## 2017-12-19 ENCOUNTER — Encounter: Payer: Self-pay | Admitting: Physician Assistant

## 2017-12-19 ENCOUNTER — Other Ambulatory Visit: Payer: Self-pay | Admitting: Physician Assistant

## 2017-12-19 DIAGNOSIS — F988 Other specified behavioral and emotional disorders with onset usually occurring in childhood and adolescence: Secondary | ICD-10-CM

## 2017-12-19 DIAGNOSIS — G4700 Insomnia, unspecified: Secondary | ICD-10-CM

## 2017-12-19 MED ORDER — LISDEXAMFETAMINE DIMESYLATE 50 MG PO CAPS: 50.0000 mg | ORAL_CAPSULE | Freq: Every day | ORAL | 0 refills | Status: DC

## 2017-12-19 MED ORDER — ZOLPIDEM TARTRATE 10 MG PO TABS: 10.0000 mg | ORAL_TABLET | Freq: Every evening | ORAL | 5 refills | Status: DC | PRN

## 2017-12-19 NOTE — Telephone Encounter (Signed)
Last OV 11/27/2017 Last refill  11/11/2017 Ok to refill?

## 2017-12-24 ENCOUNTER — Encounter: Payer: Self-pay | Admitting: "Endocrinology

## 2017-12-24 ENCOUNTER — Ambulatory Visit: Payer: BLUE CROSS/BLUE SHIELD | Admitting: "Endocrinology

## 2017-12-24 VITALS — BP 127/83 | HR 70 | Ht 66.5 in | Wt 239.0 lb

## 2017-12-24 DIAGNOSIS — E890 Postprocedural hypothyroidism: Secondary | ICD-10-CM

## 2017-12-24 DIAGNOSIS — Z85850 Personal history of malignant neoplasm of thyroid: Secondary | ICD-10-CM

## 2017-12-24 MED ORDER — LEVOTHYROXINE SODIUM 137 MCG PO CAPS: 137.0000 ug | ORAL_CAPSULE | Freq: Every day | ORAL | 1 refills | Status: DC

## 2017-12-24 NOTE — Progress Notes (Signed)
Endocrinology follow-up note                                         12/24/2017, 5:35 PM   Crystal Waters is a 62 y.o.-year-old female patient being seen in follow-up  for hypothyroidism referred by Crystal Rossetti, MD.   Past Medical History:  Diagnosis Date  . Allergy    gluten, seasonal  . Anxiety   . Arthritis   . Asthma    seasonal   . Cancer ([place])    Thyroid  . Complication of anesthesia   . Constipation   . Depression   . GERD (gastroesophageal reflux disease)   . Gestational diabetes mellitus    with 1 child.    . H/O nose injury    accidently hit in the nose, has a stuffy nose, can't lie flat, wears nasal strips at night  . History of kidney stones      x 2 passed  . Hypertension   . Hypothyroidism   . PONV (postoperative nausea and vomiting)   . PTSD (post-traumatic stress disorder)   . Thyroid cancer ([place])   . Thyroid disease    Past Surgical History:  Procedure Laterality Date  . CHOLECYSTECTOMY N/A 04/11/2016   Procedure: LAPAROSCOPIC CHOLECYSTECTOMY WITH INTRAOPERATIVE CHOLANGIOGRAM;  Surgeon: Crystal Skeans, MD;  Location: [facility];  Service: General;  Laterality: N/A;  . COLONOSCOPY    . KNEE SURGERY Right 2009  . LAPAROTOMY N/A 04/17/2016   Procedure: LAPAROTOMY REPAIR OF INCARCERATED INCISIONAL HERNIA WITH VAC PLACEMENT;  Surgeon: Crystal Bookbinder, MD;  Location: [facility];  Service: General;  Laterality: N/A;  . THYROID SURGERY     2001  . TUBAL LIGATION     Social History   Socioeconomic History  . Marital status: Divorced    Spouse name: Not on file  . Number of children: 2  . Years of education: Not on file  . Highest education level: Not on file  Occupational History  . Occupation: Field seismologist  Social Needs  . Financial resource strain: Not on file  . Food insecurity:    Worry: Not on file    Inability: Not on file  . Transportation needs:    Medical: Not on  file    Non-medical: Not on file  Tobacco Use  . Smoking status: Former Research scientist (life sciences)  . Smokeless tobacco: Never Used  . Tobacco comment: social smoker- never a habit  Substance and Sexual Activity  . Alcohol use: Yes    Comment: occasionally   . Drug use: No  . Sexual activity: Not Currently  Lifestyle  . Physical activity:    Days per week: Not on file    Minutes per session: Not on file  . Stress: Not on file  Relationships  . Social connections:    Talks on phone: Not on file    Gets together: Not on file    Attends religious service: Not on file  Active member of club or organization: Not on file    Attends meetings of clubs or organizations: Not on file    Relationship status: Not on file  Other Topics Concern  . Not on file  Social History Narrative  . Not on file   Outpatient Encounter Medications as of 12/24/2017  Medication Sig  . acetaminophen (TYLENOL) 500 MG tablet Take 500 mg every 8 (eight) hours as needed by mouth.  Marland Kitchen buPROPion (WELLBUTRIN XL) 300 MG 24 hr tablet Take 1 tablet (300 mg total) by mouth daily.  . Cholecalciferol (VITAMIN D) 2000 UNITS tablet Take 2,000 Units by mouth daily.  . diclofenac sodium (VOLTAREN) 1 % GEL APPLY TO AFFECTED AREA 4 TIMES A DAY AS NEEDED  . ibuprofen (ADVIL,MOTRIN) 200 MG tablet Take 400 mg by mouth every 8 (eight) hours as needed for mild pain or moderate pain.  . Levothyroxine Sodium (TIROSINT) 137 MCG CAPS Take 1 capsule (137 mcg total) by mouth daily before breakfast.  . lisdexamfetamine (VYVANSE) 50 MG capsule Take 1 capsule (50 mg total) by mouth daily.  Marland Kitchen lisinopril (PRINIVIL,ZESTRIL) 10 MG tablet TAKE 1 TABLET (10 MG TOTAL) BY MOUTH DAILY.  Marland Kitchen loratadine (CLARITIN) 10 MG tablet Take 10 mg by mouth daily.  Marland Kitchen omeprazole (PRILOSEC) 40 MG capsule Take 1 capsule (40 mg total) by mouth daily.  Marland Kitchen OVER THE COUNTER MEDICATION Nasal spay qhs  . ranitidine (ZANTAC) 150 MG capsule Take 1 capsule (150 mg total) every evening by mouth.  (Patient not taking: Reported on 11/27/2017)  . sucralfate (CARAFATE) 1 g tablet Take 1 tablet (1 g total) by mouth 3 (three) times daily with meals.  . triamcinolone cream (KENALOG) 0.1 % Apply 1 application topically 2 (two) times daily. (Patient taking differently: Apply 1 application as needed topically. )  . zolpidem (AMBIEN) 10 MG tablet Take 1 tablet (10 mg total) by mouth at bedtime as needed for sleep.  . [DISCONTINUED] Levothyroxine Sodium (TIROSINT) 125 MCG CAPS Take 1 capsule (125 mcg total) by mouth daily before breakfast.   No facility-administered encounter medications on file as of 12/24/2017.    ALLERGIES: Allergies  Allergen Reactions  . Gluten Meal Rash  . Zoloft [Sertraline Hcl] Other (See Comments)    STOMACH ACHES   VACCINATION STATUS: Immunization History  Administered Date(s) Administered  . Influenza,inj,Quad PF,6+ Mos 11/22/2015, 03/05/2017    HPI   Crystal Waters  is a patient with the above medical history.  Her prior thyroid records are not available to review.  Her thyroid history starts in April 2001 when she underwent total thyroidectomy for thyroid malignancy.  She recalls being treated with radioactive iodine remnant ablation following her surgery.  After her last visit, she underwent thyroid/neck ultrasound which reveals no remnant thyroid tissue or masses or signs of recurrence in the thyroid bed/neck.   -She was switched to Tirosint 125 mcg p.o. every morning after she reported significant intolerance to levothyroxine, Synthroid and having problem regulating her thyroid function tests using [facility].   -She presents with improved clinical feeling, weight loss, better energy, and tolerating her Tirosint.   She denies palpitations, tremors, irritability. She denies family history of thyroid malignancy, however thyroid dysfunction in multiple family members including her mother and her siblings.  -She denies feeling nodules in neck, hoarseness,  dysphagia/odynophagia, SOB with lying down.  No h/o radiation tx to head or neck.   I reviewed her chart and she also has a hypertension on treatment, GERD on Prilosec.  ROS:  Constitutional:  + weight loss, + fatigue, no subjective hyperthermia, no subjective hypothermia Eyes: no blurry vision, no xerophthalmia ENT: no sore throat, no nodules palpated in throat, no dysphagia/odynophagia, no hoarseness Cardiovascular: no Chest Pain, no Shortness of Breath, no palpitations, no leg swelling  Gastrointestinal: no Nausea/Vomiting/Diarhhea Musculoskeletal: no muscle/joint aches Skin: no rashes Neurological: no tremors, no numbness, no dizziness.   Psychiatric: no depression, no anxiety   Physical Exam: BP 127/83   Pulse 70   Ht 5' 6.5" (1.689 m)   Wt 239 lb (108.4 kg)   BMI 38.00 kg/m  Wt Readings from Last 3 Encounters:  12/24/17 239 lb (108.4 kg)  12/03/17 241 lb (109.3 kg)  11/27/17 244 lb (110.7 kg)    Constitutional: +Obese with BMI of 38, not in acute distress, normal state of mind Eyes: PERRLA, EOMI, no exophthalmos ENT: moist mucous membranes, + faint thyroidectomy scar on anterior lower neck , no cervical lymphadenopathy Cardiovascular: normal precordial activity, Regular Rate and Rhythm, no Murmur/Rubs/Gallops Respiratory:  adequate breathing efforts, no gross chest deformity, Clear to auscultation bilaterally Gastrointestinal: abdomen soft, Non -tender, No distension, Bowel Sounds present Musculoskeletal: no gross deformities, strength intact in all four extremities Skin: moist, warm, no rashes Neurological: no tremor with outstretched hands, Deep tendon reflexes normal in all four extremities.   CMP ( most recent) CMP     Component Value Date/Time   NA 140 04/24/2017 1000   K 4.5 04/24/2017 1000   CL 106 04/24/2017 1000   CO2 28 04/24/2017 1000   GLUCOSE 91 04/24/2017 1000   BUN 17 04/24/2017 1000   CREATININE 0.92 04/24/2017 1000   CALCIUM 9.3  04/24/2017 1000   PROT 6.8 04/24/2017 1000   ALBUMIN 3.8 08/21/2016 0850   AST 15 04/24/2017 1000   ALT 14 04/24/2017 1000   ALKPHOS 57 08/21/2016 0850   BILITOT 0.4 04/24/2017 1000   GFRNONAA >60 04/22/2016 0327   GFRNONAA 60 04/11/2015 1656   GFRAA >60 04/22/2016 0327   GFRAA 69 04/11/2015 1656     Diabetic Labs (most recent): Lab Results  Component Value Date   HGBA1C 5.4 04/24/2017   HGBA1C 5.3 05/15/2016   HGBA1C 5.4 11/22/2015     Lipid Panel ( most recent) Lipid Panel     Component Value Date/Time   CHOL 229 (H) 04/24/2017 1000   TRIG 131 04/24/2017 1000   HDL 75 04/24/2017 1000   CHOLHDL 3.1 04/24/2017 1000   VLDL 34 (H) 05/15/2016 1143   LDLCALC 129 (H) 04/24/2017 1000     Recent Results (from the past 2160 hour(s))  STREP GROUP A AG, W/REFLEX TO CULT     Status: None   Collection Time: 11/11/17  2:52 PM  Result Value Ref Range   Streptococcus, Group A Screen (Direct) NONE DETECTED   Culture, Group A Strep     Status: None   Collection Time: 11/11/17  2:52 PM  Result Value Ref Range   MICRO NUMBER: [id number]    SPECIMEN QUALITY: ADEQUATE    SOURCE: THROAT    STATUS: FINAL    RESULT: No group A Streptococcus isolated   TSH     Status: Abnormal   Collection Time: 12/16/17  1:19 PM  Result Value Ref Range   TSH 7.54 (H) 0.40 - 4.50 mIU/L  T4, free     Status: None   Collection Time: 12/16/17  1:19 PM  Result Value Ref Range   Free T4 1.4 0.8 - 1.8 ng/dL  T3, free     Status: None   Collection Time: 12/16/17  1:19 PM  Result Value Ref Range   T3, Free 2.5 2.3 - 4.2 pg/mL      ASSESSMENT: 1. Postsurgical Hypothyroidism 2. History of thyroid malignancy  PLAN:    Patient with long-standing postsurgical hypothyroidism after thyroidectomy for thyroid cancer in April 2001.  -Her previsit thyroid ultrasound is unremarkable for surgically absent thyroid tissue and no sign of recurrence.  Per her description, her initial treatment for thyroid  malignancy appears to be complete, however, she did not have subsequent surveillance thyroid/neck imaging.  Her treatment was completed approximately 18 years ago-she is likely in remission.  She will not need further intervention for history of thyroid malignancy at this time.  -Her previsit thyroid function tests are consistent with slight under replacement with Tirosint. -I discussed and increased her Tirosint to 137 mcg p.o. daily before breakfast.  - We discussed about correct intake of levothyroxine, at fasting, with water, separated by at least 30 minutes from breakfast, and separated by more than 4 hours from calcium, iron, multivitamins, acid reflux medications (PPIs). -Patient is made aware of the fact that thyroid hormone replacement is needed for life, dose to be adjusted by periodic monitoring of thyroid function tests.  - Time spent with the patient: 25 min, of which >50% was spent in reviewing her  current and  previous labs, previous treatments, and medications doses and developing a plan for long-term care.  Orson Gear participated in the discussions, expressed understanding, and voiced agreement with the above plans.  All questions were answered to her satisfaction. she is encouraged to contact clinic should she have any questions or concerns prior to her return visit.  Return in about 6 months (around 06/25/2018) for Follow up with Pre-visit Labs.  Glade Lloyd, MD [facility] [address] [place], [place] [address] Phone: [phone]  Fax: [phone]   12/24/2017, 5:35 PM  This note was partially dictated with voice recognition software. Similar sounding words can be transcribed inadequately or may not  be corrected upon review.

## 2017-12-24 NOTE — Patient Instructions (Signed)

## 2018-01-02 ENCOUNTER — Telehealth: Payer: Self-pay | Admitting: Gastroenterology

## 2018-01-02 NOTE — Telephone Encounter (Signed)
FYI:  Dr Ardis Hughs the pt states she is doing well eating smaller meals and was advised she can take 20 mg Pepcid in place of Zantac.  The pt has been advised of the information and verbalized understanding.

## 2018-01-02 NOTE — Telephone Encounter (Signed)
Patient was told by Dr Ardis Hughs to call us to let him know how she was doing, pt states that she is doing well as long as she eats small meals but he wanted her to take Zantac and they are going to take this med off market,wants to know what does he suggest for her to start taking.

## 2018-01-19 ENCOUNTER — Other Ambulatory Visit: Payer: Self-pay | Admitting: Physician Assistant

## 2018-01-19 DIAGNOSIS — F988 Other specified behavioral and emotional disorders with onset usually occurring in childhood and adolescence: Secondary | ICD-10-CM

## 2018-01-20 MED ORDER — LISDEXAMFETAMINE DIMESYLATE 50 MG PO CAPS: 50.0000 mg | ORAL_CAPSULE | Freq: Every day | ORAL | 0 refills | Status: DC

## 2018-01-20 NOTE — Telephone Encounter (Signed)
Ok to refill??  Last office visit 11/27/2017.  Last refill 12/19/2017.

## 2018-01-23 ENCOUNTER — Other Ambulatory Visit: Payer: Self-pay | Admitting: Family Medicine

## 2018-02-03 ENCOUNTER — Encounter: Payer: Self-pay | Admitting: Family Medicine

## 2018-02-05 ENCOUNTER — Ambulatory Visit: Payer: BLUE CROSS/BLUE SHIELD | Admitting: Family Medicine

## 2018-02-05 ENCOUNTER — Other Ambulatory Visit: Payer: Self-pay

## 2018-02-05 ENCOUNTER — Encounter: Payer: Self-pay | Admitting: Family Medicine

## 2018-02-05 VITALS — BP 122/64 | HR 72 | Temp 98.4°F | Resp 14 | Ht 66.5 in | Wt 235.0 lb

## 2018-02-05 DIAGNOSIS — R1011 Right upper quadrant pain: Secondary | ICD-10-CM

## 2018-02-05 DIAGNOSIS — N200 Calculus of kidney: Secondary | ICD-10-CM | POA: Diagnosis not present

## 2018-02-05 DIAGNOSIS — K219 Gastro-esophageal reflux disease without esophagitis: Secondary | ICD-10-CM | POA: Diagnosis not present

## 2018-02-05 DIAGNOSIS — R197 Diarrhea, unspecified: Secondary | ICD-10-CM

## 2018-02-05 LAB — COMPREHENSIVE METABOLIC PANEL
AG Ratio: 1.5 (calc) (ref 1.0–2.5)
ALKALINE PHOSPHATASE (APISO): 59 U/L (ref 33–130)
ALT: 15 U/L (ref 6–29)
AST: 17 U/L (ref 10–35)
Albumin: 4.1 g/dL (ref 3.6–5.1)
BILIRUBIN TOTAL: 0.4 mg/dL (ref 0.2–1.2)
BUN/Creatinine Ratio: 12 (calc) (ref 6–22)
BUN: 12 mg/dL (ref 7–25)
CALCIUM: 9.6 mg/dL (ref 8.6–10.4)
CO2: 26 mmol/L (ref 20–32)
CREATININE: 1.02 mg/dL — AB (ref 0.50–0.99)
Chloride: 103 mmol/L (ref 98–110)
Globulin: 2.7 g/dL (calc) (ref 1.9–3.7)
Glucose, Bld: 89 mg/dL (ref 65–99)
Potassium: 4.4 mmol/L (ref 3.5–5.3)
Sodium: 139 mmol/L (ref 135–146)
Total Protein: 6.8 g/dL (ref 6.1–8.1)

## 2018-02-05 LAB — CBC WITH DIFFERENTIAL/PLATELET
BASOS ABS: 42 {cells}/uL (ref 0–200)
Basophils Relative: 0.7 %
Eosinophils Absolute: 48 cells/uL (ref 15–500)
Eosinophils Relative: 0.8 %
HCT: 40.9 % (ref 35.0–45.0)
Hemoglobin: 13.7 g/dL (ref 11.7–15.5)
LYMPHS ABS: 1920 {cells}/uL (ref 850–3900)
MCH: 30.1 pg (ref 27.0–33.0)
MCHC: 33.5 g/dL (ref 32.0–36.0)
MCV: 89.9 fL (ref 80.0–100.0)
MPV: 11.4 fL (ref 7.5–12.5)
Monocytes Relative: 8.5 %
NEUTROS PCT: 58 %
Neutro Abs: 3480 cells/uL (ref 1500–7800)
PLATELETS: 210 10*3/uL (ref 140–400)
RBC: 4.55 10*6/uL (ref 3.80–5.10)
RDW: 13 % (ref 11.0–15.0)
Total Lymphocyte: 32 %
WBC mixed population: 510 cells/uL (ref 200–950)
WBC: 6 10*3/uL (ref 3.8–10.8)

## 2018-02-05 MED ORDER — TAMSULOSIN HCL 0.4 MG PO CAPS: 0.4000 mg | ORAL_CAPSULE | Freq: Every day | ORAL | 0 refills | Status: DC

## 2018-02-05 MED ORDER — CIPROFLOXACIN HCL 500 MG PO TABS: 500.0000 mg | ORAL_TABLET | Freq: Two times a day (BID) | ORAL | 0 refills | Status: DC

## 2018-02-05 NOTE — Assessment & Plan Note (Signed)
Ongoing reflux gastrointestinal problems.  She is status post cholecystectomy.  Some of her right upper quadrant pain could be related to kidney stones moving that she has had pain on and off since her cholecystectomy so this may be just due to some scar tissue.  She has seen GI and there are no red flags on exam.  With regards to the diarrhea she seems to be more constipated orally to have more formed stools during the week and then looser on the weekend.  Him to have her pull back on the MiraLAX and do it 3 times a week and see if this adjust things.  I query if some this is related to just her stress and anxiety as well.  For the reflux she will continue the Prilosec if she is doing fine on this currently.  The next that would be to return her to GI for possible EGD.  She has not had any severe weight loss but is slowly losing a few pounds here and there that she has changed her diet.  With regard to the kidney stones this could be causing some of the right side pain especially based on her history.  She is already urinated therefore urine sample was unable to be obtained.  Since she has known stones and she has had problems with being able to release her bladder during the workweek due to her restrictions from her bowels is possible that she could have urinary tract infection.  I will put her on Cipro 500mg  for 3 days we will also give her Flomax to take for 2 weeks.  obtain basic labs.

## 2018-02-05 NOTE — Progress Notes (Signed)
Subjective:    Patient ID: Crystal Waters, female    DOB: 08-Jan-1956, 62 y.o.   MRN: [id number]  Patient presents for Kidney Stones and GI Issues (reflux and diarrhea)   Pt here with continued reflux and diarrheal episodes. We evaluated this back in May started on prilosec instead of  Zantac.She then stopped the prilosec as it was causing abd discomoftt and started Carafate, but this did not help as much . She saw Dr. Elwyn Reach in September she returned to zantac at that time, iuntil recently when zantac was taken off the market, she restarted prilosec without any difficulty.   The diarrhea occurs on the weekends and she gets cramping, she gets nausea and wretching with her bowel movements . otherwie needs miralax during the week- takes every other day She watches her diet, limits meats She does admit to stress at work, they [employer] her restrictions, often has to hold urine, doesn't get to leave when sick. Feels her body may decompress on the weekends    She has Bilateral kidney stones, seen on KUB in May . She felt pain on right abdominal side wall, not quite the flank, she did have some pressure with urinating, feels the stone may be moving around  Review Of Systems:  GEN- denies fatigue, fever, weight loss,weakness, recent illness HEENT- denies eye drainage, change in vision, nasal discharge, CVS- denies chest pain, palpitations RESP- denies SOB, cough, wheeze ABD- denies N/V, change in stools, +abd pain GU- denies dysuria, hematuria, dribbling, incontinence MSK- denies joint pain, muscle aches, injury Neuro- denies headache, dizziness, syncope, seizure activity       Objective:    BP 122/64   Pulse 72   Temp 98.4 F (36.9 C) (Oral)   Resp 14   Ht 5' 6.5" (1.689 m)   Wt 235 lb (106.6 kg)   SpO2 98%   BMI 37.36 kg/m  GEN- NAD, alert and oriented x3 HEENT- PERRL, EOMI, non injected sclera, pink conjunctiva, MMM, oropharynx clear Neck- Supple, no LAD  CVS- RRR, no  murmur RESP-CTAB ABD-NABS,soft,, TTP RUQ, no rebound, mild TTP Right side wall, ND EXT- No edema Pulses- Radial 2+        Assessment & Plan:      Problem List Items Addressed This Visit      Unprioritized   GERD (gastroesophageal reflux disease)    Ongoing reflux gastrointestinal problems.  She is status post cholecystectomy.  Some of her right upper quadrant pain could be related to kidney stones moving that she has had pain on and off since her cholecystectomy so this may be just due to some scar tissue.  She has seen GI and there are no red flags on exam.  With regards to the diarrhea she seems to be more constipated orally to have more formed stools during the week and then looser on the weekend.  Him to have her pull back on the MiraLAX and do it 3 times a week and see if this adjust things.  I query if some this is related to just her stress and anxiety as well.  For the reflux she will continue the Prilosec if she is doing fine on this currently.  The next that would be to return her to GI for possible EGD.  She has not had any severe weight loss but is slowly losing a few pounds here and there that she has changed her diet.  With regard to the kidney stones this could be causing some  of the right side pain especially based on her history.  She is already urinated therefore urine sample was unable to be obtained.  Since she has known stones and she has had problems with being able to release her bladder during the workweek due to her restrictions from her bowels is possible that she could have urinary tract infection.  I will put her on Cipro 500mg  for 3 days we will also give her Flomax to take for 2 weeks.  obtain basic labs.       Other Visit Diagnoses    Kidney stones    -  Primary   RUQ pain       Relevant Orders   CBC with Differential/Platelet   Comprehensive metabolic panel   Diarrhea, unspecified type       Relevant Orders   CBC with Differential/Platelet    Comprehensive metabolic panel      Note: This dictation was prepared with Dragon dictation along with smaller phrase technology. Any transcriptional errors that result from this process are unintentional.

## 2018-02-05 NOTE — Patient Instructions (Addendum)
Cut down the miralax to three times a day  Continue prilosec once a day  Flomax for 2 weeks 3 days of Cipro  F/U as previous

## 2018-02-17 ENCOUNTER — Other Ambulatory Visit: Payer: Self-pay | Admitting: Family Medicine

## 2018-02-17 DIAGNOSIS — F988 Other specified behavioral and emotional disorders with onset usually occurring in childhood and adolescence: Secondary | ICD-10-CM

## 2018-02-17 MED ORDER — LISDEXAMFETAMINE DIMESYLATE 50 MG PO CAPS: 50.0000 mg | ORAL_CAPSULE | Freq: Every day | ORAL | 0 refills | Status: DC

## 2018-02-17 NOTE — Telephone Encounter (Signed)
Requesting refill    Vyvanse  LOV: 02/05/18   LRF:  01/20/18

## 2018-02-26 ENCOUNTER — Ambulatory Visit: Payer: BLUE CROSS/BLUE SHIELD | Admitting: Family Medicine

## 2018-02-27 ENCOUNTER — Other Ambulatory Visit: Payer: Self-pay | Admitting: Family Medicine

## 2018-03-19 ENCOUNTER — Other Ambulatory Visit: Payer: Self-pay | Admitting: Family Medicine

## 2018-03-19 DIAGNOSIS — F988 Other specified behavioral and emotional disorders with onset usually occurring in childhood and adolescence: Secondary | ICD-10-CM

## 2018-03-20 NOTE — Telephone Encounter (Signed)
Ok to refill??  Last office visit 02/05/2018.  Last refill 02/17/2018.

## 2018-03-21 MED ORDER — LISDEXAMFETAMINE DIMESYLATE 50 MG PO CAPS: 50.0000 mg | ORAL_CAPSULE | Freq: Every day | ORAL | 0 refills | Status: DC

## 2018-04-07 ENCOUNTER — Ambulatory Visit (INDEPENDENT_AMBULATORY_CARE_PROVIDER_SITE_OTHER): Payer: BLUE CROSS/BLUE SHIELD

## 2018-04-07 DIAGNOSIS — Z23 Encounter for immunization: Secondary | ICD-10-CM

## 2018-04-07 NOTE — Progress Notes (Signed)
Patient came in to receive her annual flu vaccine. Fluarix was given in the left deltoid. Patient tolerated well. VIS was given.

## 2018-04-11 ENCOUNTER — Encounter: Payer: Self-pay | Admitting: Family Medicine

## 2018-04-11 MED ORDER — NYSTATIN 100000 UNIT/ML MT SUSP: 5.0000 mL | Freq: Four times a day (QID) | OROMUCOSAL | 0 refills | Status: DC

## 2018-04-18 ENCOUNTER — Other Ambulatory Visit: Payer: Self-pay | Admitting: Family Medicine

## 2018-04-18 DIAGNOSIS — F988 Other specified behavioral and emotional disorders with onset usually occurring in childhood and adolescence: Secondary | ICD-10-CM

## 2018-04-18 MED ORDER — LISDEXAMFETAMINE DIMESYLATE 50 MG PO CAPS: 50.0000 mg | ORAL_CAPSULE | Freq: Every day | ORAL | 0 refills | Status: DC

## 2018-04-18 NOTE — Telephone Encounter (Signed)
Ok to refill??  Last office visit 02/05/2018.  Last refill 03/21/2018.

## 2018-04-22 ENCOUNTER — Other Ambulatory Visit: Payer: Self-pay | Admitting: Family Medicine

## 2018-04-24 ENCOUNTER — Telehealth: Payer: Self-pay | Admitting: *Deleted

## 2018-04-24 MED ORDER — DICLOFENAC SODIUM 1 % TD GEL: TRANSDERMAL | 2 refills | Status: DC

## 2018-04-24 NOTE — Telephone Encounter (Signed)
Received request from pharmacy for PA on Voltaren Gel.   PA submitted.   Dx: M17- OA, Knee  Received immediate determination.   PA [id number] approved 03/25/2018- 04/24/2019.

## 2018-05-06 ENCOUNTER — Encounter: Payer: Self-pay | Admitting: Family Medicine

## 2018-05-06 ENCOUNTER — Other Ambulatory Visit: Payer: Self-pay

## 2018-05-06 ENCOUNTER — Ambulatory Visit (INDEPENDENT_AMBULATORY_CARE_PROVIDER_SITE_OTHER): Payer: BLUE CROSS/BLUE SHIELD | Admitting: Family Medicine

## 2018-05-06 VITALS — BP 120/68 | HR 72 | Temp 98.2°F | Resp 12 | Ht 66.5 in | Wt 227.0 lb

## 2018-05-06 DIAGNOSIS — K219 Gastro-esophageal reflux disease without esophagitis: Secondary | ICD-10-CM

## 2018-05-06 DIAGNOSIS — Z0000 Encounter for general adult medical examination without abnormal findings: Secondary | ICD-10-CM | POA: Diagnosis not present

## 2018-05-06 DIAGNOSIS — F5104 Psychophysiologic insomnia: Secondary | ICD-10-CM

## 2018-05-06 DIAGNOSIS — E038 Other specified hypothyroidism: Secondary | ICD-10-CM

## 2018-05-06 DIAGNOSIS — Z6836 Body mass index (BMI) 36.0-36.9, adult: Secondary | ICD-10-CM

## 2018-05-06 DIAGNOSIS — I10 Essential (primary) hypertension: Secondary | ICD-10-CM

## 2018-05-06 DIAGNOSIS — M170 Bilateral primary osteoarthritis of knee: Secondary | ICD-10-CM

## 2018-05-06 DIAGNOSIS — F900 Attention-deficit hyperactivity disorder, predominantly inattentive type: Secondary | ICD-10-CM

## 2018-05-06 DIAGNOSIS — F988 Other specified behavioral and emotional disorders with onset usually occurring in childhood and adolescence: Secondary | ICD-10-CM | POA: Insufficient documentation

## 2018-05-06 MED ORDER — AMPHETAMINE-DEXTROAMPHET ER 20 MG PO CP24: 20.0000 mg | ORAL_CAPSULE | Freq: Every day | ORAL | 0 refills | Status: DC

## 2018-05-06 MED ORDER — TRAMADOL HCL 50 MG PO TABS: 50.0000 mg | ORAL_TABLET | Freq: Two times a day (BID) | ORAL | 1 refills | Status: DC | PRN

## 2018-05-06 MED ORDER — TEMAZEPAM 15 MG PO CAPS: 15.0000 mg | ORAL_CAPSULE | Freq: Every evening | ORAL | 2 refills | Status: DC | PRN

## 2018-05-06 MED ORDER — ONDANSETRON 4 MG PO TBDP: 4.0000 mg | ORAL_TABLET | Freq: Three times a day (TID) | ORAL | 2 refills | Status: DC | PRN

## 2018-05-06 NOTE — Assessment & Plan Note (Signed)
Followed by endorne

## 2018-05-06 NOTE — Assessment & Plan Note (Signed)
Well controlled no changes 

## 2018-05-06 NOTE — Patient Instructions (Addendum)
Complete Vyvanse, You will then be changed to Adderall Try the restoril  Call Dr. Ardis Hughs for the EGD Zofran for nausea We will call with lab results  Ultram F/U 4 months

## 2018-05-06 NOTE — Assessment & Plan Note (Signed)
Add ultram prn severe pain, unable to have surgical intervention at this time

## 2018-05-06 NOTE — Assessment & Plan Note (Signed)
Recommend she call and reschedule with her GI for the EGD

## 2018-05-06 NOTE — Assessment & Plan Note (Signed)
D/c ambien, trial of restoril

## 2018-05-06 NOTE — Progress Notes (Signed)
Subjective:    Patient ID: Crystal Waters, female    DOB: 12-22-1955, 63 y.o.   MRN: [id number]  Patient presents for Annual Exam (is fasting)  Pt here for CPE. Medications and history reviewed  Chronic OA knees- taking tylenol, using biofreeze , constant pain, can not have surgery due to financial strain and care for her adult son  Hypothyroidism- followed by endocrinology   Chronic abd pain- GERD- now on prilosec 40mg  once a day, off zantac due to recall  still has the constipation/diarrhea- using miralax  continues to have GI upset, hoarse voice intermittently, belching has improved   She was able to get the thrush cleared up, trying to reduce sugar, also takin proobiotics   Taking MVI and vitamin D   No kidney stone pain   Chronic insomnia- taking ambien but still doesn't sleep well   ADD- she is taking the Vyvanse, often forgot to take the wellbutrin, so she just stopped it. Vyvanse cost her too much, called insurance , generic preferred is adderall  HTN- taking lisinopril   Colonoscopy due in 2021 Mammogram - UTD, wants 2 year cycle Immunizations- due for Shingrix  Review Of Systems:  GEN- denies fatigue, fever, weight loss,weakness, recent illness HEENT- denies eye drainage, change in vision, nasal discharge, CVS- denies chest pain, palpitations RESP- denies SOB, cough, wheeze ABD- denies N/V, change in stools, abd pain GU- denies dysuria, hematuria, dribbling, incontinence MSK- + joint pain, muscle aches, injury Neuro- denies headache, dizziness, syncope, seizure activity       Objective:    BP 120/68   Pulse 72   Temp 98.2 F (36.8 C) (Oral)   Resp 12   Ht 5' 6.5" (1.689 m)   Wt 227 lb (103 kg)   SpO2 97%   BMI 36.09 kg/m  GEN- NAD, alert and oriented x3 HEENT- PERRL, EOMI, non injected sclera, pink conjunctiva, MMM, oropharynx clear Neck- Supple, no thyromegaly CVS- RRR, no murmur RESP-CTAB ABD-NABS,soft,NT,ND Psych- normal affect and mood   EXT- No edema Pulses- Radial, DP- 2+        Assessment & Plan:      Problem List Items Addressed This Visit      Unprioritized   ADD (attention deficit disorder)    Will plan to switch to Adderall 20mg  XR next refill, due to cost       Chronic insomnia    D/c ambien, trial of restoril      Essential hypertension, benign    Well controlled no changes      Relevant Orders   CBC with Differential/Platelet   Comprehensive metabolic panel   Lipid panel   GERD (gastroesophageal reflux disease)    Recommend she call and reschedule with her GI for the EGD      Relevant Medications   ondansetron (ZOFRAN ODT) 4 MG disintegrating tablet   Hypothyroidism    Followed by endorne      OA (osteoarthritis) of knee    Add ultram prn severe pain, unable to have surgical intervention at this time       Relevant Medications   traMADol (ULTRAM) 50 MG tablet   Obesity    Continue with dietary changes, some activity, but limited by OA of knee      Relevant Medications   amphetamine-dextroamphetamine (ADDERALL XR) 20 MG 24 hr capsule    Other Visit Diagnoses    Routine general medical examination at a health care facility    -  Primary   CPE  done, fasting labs, declines shingles today, mammogram every 2 years   Relevant Orders   CBC with Differential/Platelet      Note: This dictation was prepared with Dragon dictation along with smaller phrase technology. Any transcriptional errors that result from this process are unintentional.

## 2018-05-06 NOTE — Assessment & Plan Note (Signed)
Will plan to switch to Adderall 20mg  XR next refill, due to cost

## 2018-05-06 NOTE — Assessment & Plan Note (Signed)
Continue with dietary changes, some activity, but limited by OA of knee

## 2018-05-07 ENCOUNTER — Encounter: Payer: Self-pay | Admitting: Gastroenterology

## 2018-05-07 LAB — LIPID PANEL
Cholesterol: 213 mg/dL — ABNORMAL HIGH (ref <200)
HDL: 64 mg/dL (ref >=50)
LDL Cholesterol (Calc): 127 mg/dL (calc) — ABNORMAL HIGH
Non-HDL Cholesterol (Calc): 149 mg/dL (calc) — ABNORMAL HIGH (ref <130)
TRIGLYCERIDES: 111 mg/dL (ref <150)
Total CHOL/HDL Ratio: 3.3 (calc) (ref <5.0)

## 2018-05-07 LAB — CBC WITH DIFFERENTIAL/PLATELET
Absolute Monocytes: 462 cells/uL (ref 200–950)
Basophils Absolute: 23 cells/uL (ref 0–200)
Basophils Relative: 0.4 %
EOS PCT: 1.2 %
Eosinophils Absolute: 68 cells/uL (ref 15–500)
HCT: 42 % (ref 35.0–45.0)
Hemoglobin: 14.2 g/dL (ref 11.7–15.5)
Lymphs Abs: 1596 cells/uL (ref 850–3900)
MCH: 30.5 pg (ref 27.0–33.0)
MCHC: 33.8 g/dL (ref 32.0–36.0)
MCV: 90.3 fL (ref 80.0–100.0)
MPV: 11.6 fL (ref 7.5–12.5)
Monocytes Relative: 8.1 %
Neutro Abs: 3551 cells/uL (ref 1500–7800)
Neutrophils Relative %: 62.3 %
PLATELETS: 202 10*3/uL (ref 140–400)
RBC: 4.65 10*6/uL (ref 3.80–5.10)
RDW: 12.3 % (ref 11.0–15.0)
TOTAL LYMPHOCYTE: 28 %
WBC: 5.7 10*3/uL (ref 3.8–10.8)

## 2018-05-07 LAB — COMPREHENSIVE METABOLIC PANEL
AG Ratio: 1.6 (calc) (ref 1.0–2.5)
ALT: 14 U/L (ref 6–29)
AST: 14 U/L (ref 10–35)
Albumin: 4.1 g/dL (ref 3.6–5.1)
Alkaline phosphatase (APISO): 49 U/L (ref 37–153)
BUN: 15 mg/dL (ref 7–25)
CO2: 30 mmol/L (ref 20–32)
Calcium: 9.8 mg/dL (ref 8.6–10.4)
Chloride: 105 mmol/L (ref 98–110)
Creat: 0.91 mg/dL (ref 0.50–0.99)
Globulin: 2.6 g/dL (calc) (ref 1.9–3.7)
Glucose, Bld: 100 mg/dL — ABNORMAL HIGH (ref 65–99)
Potassium: 5 mmol/L (ref 3.5–5.3)
Sodium: 141 mmol/L (ref 135–146)
Total Bilirubin: 0.3 mg/dL (ref 0.2–1.2)
Total Protein: 6.7 g/dL (ref 6.1–8.1)

## 2018-05-07 NOTE — Telephone Encounter (Signed)
error 

## 2018-05-08 ENCOUNTER — Encounter: Payer: Self-pay | Admitting: *Deleted

## 2018-05-19 ENCOUNTER — Ambulatory Visit: Payer: BLUE CROSS/BLUE SHIELD | Admitting: Physician Assistant

## 2018-05-27 ENCOUNTER — Encounter: Payer: Self-pay | Admitting: Family Medicine

## 2018-05-28 ENCOUNTER — Other Ambulatory Visit: Payer: Self-pay

## 2018-05-28 ENCOUNTER — Other Ambulatory Visit: Payer: Self-pay | Admitting: Family Medicine

## 2018-05-28 ENCOUNTER — Encounter: Payer: Self-pay | Admitting: Physician Assistant

## 2018-05-28 ENCOUNTER — Ambulatory Visit: Payer: BLUE CROSS/BLUE SHIELD | Admitting: Physician Assistant

## 2018-05-28 ENCOUNTER — Encounter: Payer: Self-pay | Admitting: Family Medicine

## 2018-05-28 VITALS — BP 144/78 | HR 68 | Ht 66.5 in | Wt 227.0 lb

## 2018-05-28 DIAGNOSIS — K219 Gastro-esophageal reflux disease without esophagitis: Secondary | ICD-10-CM

## 2018-05-28 DIAGNOSIS — E890 Postprocedural hypothyroidism: Secondary | ICD-10-CM | POA: Diagnosis not present

## 2018-05-28 DIAGNOSIS — B370 Candidal stomatitis: Secondary | ICD-10-CM | POA: Diagnosis not present

## 2018-05-28 DIAGNOSIS — R490 Dysphonia: Secondary | ICD-10-CM | POA: Diagnosis not present

## 2018-05-28 MED ORDER — AMPHETAMINE-DEXTROAMPHET ER 20 MG PO CP24: 20.0000 mg | ORAL_CAPSULE | Freq: Every day | ORAL | 0 refills | Status: DC

## 2018-05-28 NOTE — Telephone Encounter (Signed)
Ok to refill 

## 2018-05-28 NOTE — Patient Instructions (Signed)
You have been scheduled for an endoscopy. Please follow written instructions given to you at your visit today. If you use inhalers (even only as needed), please bring them with you on the day of your procedure. Your physician has requested that you go to www.[email or web address].[email or web address] and enter the access code given to you at your visit today. This web site gives a general overview about your procedure. However, you should still follow specific instructions given to you by our office regarding your preparation for the procedure.   Continue Prilosec 40 mg daily

## 2018-05-28 NOTE — Progress Notes (Signed)
Chief Complaint: "Issues with my vocal cords, thrush and nausea"  Review of pertinent gastrointestinal problems: 1.  Routine risk for colon cancer, colonoscopy in [facility] 2011 which was done for screening, showed no polyps.  Recall 2021 2.  Laparoscopic cholecystectomy January 0938 complicated by postoperative small bowel obstruction. 3.  H. pylori positive by breath testing 2018.  PCP checked.  She was put on Pylera type antibiotics, post treatment 02/2017 stool antigen negative   HPI:    Crystal Waters is a 62 year old female with a past medical history as listed below, known to Dr. Ardis Hughs for reflux, who presents clinic today with a complaint of nausea, thrush, "problems with my vocal cords".    12/03/2017 office visit with Dr. Ardis Hughs.  At that time she was complaining of reflux.  At that time had wanted to stop PPIs previously and had been on Zantac 150 twice daily which was not controlling her symptoms.  Told to restart her Prilosec by PCP recently and took 1 pill which gave her significant abdominal pain so she stopped that as well.  Had started Carafate 3 to 4 pills a day which she has been taking for many months. Bbiggest issue was intermittent nausea and constant belching as well as intermittent pyrosis.  At that time her Carafate was stopped and she was asked to start Ranitidine 150 mg twice daily 1 pill at bedtime and 1 pill shortly after waking up in the morning.    01/02/2018 patient called our clinic due to Zantac recall.  She was advised to take 20 mg Pepcid twice daily instead of Zantac.    05/07/2018 patient wrote our clinic and described she had been taking Prilosec since around November and was still having issues with vocal cords, thrush and nausea.  Her PCP recommended she follow-up with [place].    Today, the patient tells me that for the past year she has had some hoarseness which has never really gone away regardless of which antireflux medicine she is using.  She does  tell me she has had a decrease in belching but does continue with some breakthrough heartburn and reflux symptoms as well as trouble getting rid of thrush which she has been experiencing over the past couple months.  This has been treated with Nystatin for a 4-day time, per her but she still sees a "coating on my tongue".  Currently she is using Prilosec 40 mg daily which she has been on for the past 4 months, but symptoms have been unchanged.  She is reluctant to increase the dose of this.    Denies fever, chills, weight loss, anorexia, nausea, vomiting, change in bowel habits or symptoms that awaken her from sleep.  Past Medical History:  Diagnosis Date  . Allergy    gluten, seasonal  . Anxiety   . Arthritis   . Asthma    seasonal   . Cancer ([place])    Thyroid  . Complication of anesthesia   . Constipation   . Depression   . GERD (gastroesophageal reflux disease)   . Gestational diabetes mellitus    with 1 child.    . H/O nose injury    accidently hit in the nose, has a stuffy nose, can't lie flat, wears nasal strips at night  . History of kidney stones      x 2 passed  . Hypertension   . Hypothyroidism   . PONV (postoperative nausea and vomiting)   . PTSD (post-traumatic stress disorder)   .  Thyroid cancer ([place])   . Thyroid disease     Past Surgical History:  Procedure Laterality Date  . CHOLECYSTECTOMY N/A 04/11/2016   Procedure: LAPAROSCOPIC CHOLECYSTECTOMY WITH INTRAOPERATIVE CHOLANGIOGRAM;  Surgeon: Georganna Skeans, MD;  Location: [facility];  Service: General;  Laterality: N/A;  . COLONOSCOPY    . KNEE SURGERY Right 2009  . LAPAROTOMY N/A 04/17/2016   Procedure: LAPAROTOMY REPAIR OF INCARCERATED INCISIONAL HERNIA WITH VAC PLACEMENT;  Surgeon: Rolm Bookbinder, MD;  Location: [facility];  Service: General;  Laterality: N/A;  . THYROID SURGERY     2001  . TUBAL LIGATION      Current Outpatient Medications  Medication Sig Dispense Refill  . acetaminophen (TYLENOL) 500 MG tablet  Take 500 mg every 8 (eight) hours as needed by mouth.    . Cholecalciferol (VITAMIN D) 2000 UNITS tablet Take 2,000 Units by mouth daily.    . diclofenac sodium (VOLTAREN) 1 % GEL PA [id number] approved 03/25/2018- 04/24/2019. 100 g 2  . ibuprofen (ADVIL,MOTRIN) 200 MG tablet Take 400 mg by mouth every 8 (eight) hours as needed for mild pain or moderate pain.    . Levothyroxine Sodium (TIROSINT) 137 MCG CAPS Take 1 capsule (137 mcg total) by mouth daily before breakfast. 90 capsule 1  . lisinopril (PRINIVIL,ZESTRIL) 10 MG tablet TAKE 1 TABLET (10 MG TOTAL) BY MOUTH DAILY. 90 tablet 3  . omeprazole (PRILOSEC) 40 MG capsule TAKE 1 CAPSULE BY MOUTH EVERY DAY 30 capsule 3  . ondansetron (ZOFRAN ODT) 4 MG disintegrating tablet Take 1 tablet (4 mg total) by mouth every 8 (eight) hours as needed for nausea or vomiting. 20 tablet 2  . OVER THE COUNTER MEDICATION Nasal spay qhs    . temazepam (RESTORIL) 15 MG capsule Take 1 capsule (15 mg total) by mouth at bedtime as needed for sleep. 30 capsule 2  . traMADol (ULTRAM) 50 MG tablet Take 1 tablet (50 mg total) by mouth 2 (two) times daily as needed. 30 tablet 1  . triamcinolone cream (KENALOG) 0.1 % Apply 1 application topically 2 (two) times daily. (Patient taking differently: Apply 1 application as needed topically. ) 80 g 1  . amphetamine-dextroamphetamine (ADDERALL XR) 20 MG 24 hr capsule Take 1 capsule (20 mg total) by mouth daily. (Patient not taking: Reported on 05/28/2018) 30 capsule 0  . loratadine (CLARITIN) 10 MG tablet Take 10 mg by mouth daily.     No current facility-administered medications for this visit.     Allergies as of 05/28/2018 - Review Complete 05/06/2018  Allergen Reaction Noted  . Gluten meal Rash 04/10/2016  . Zoloft [sertraline hcl] Other (See Comments) 01/13/2013    Family History  Problem Relation Age of Onset  . Heart disease Mother   . Diabetes Father   . Heart disease Father   . Hyperlipidemia Father   .  Hypertension Father   . Diabetes Brother   . Autism Son   . Lupus Maternal Grandmother   . Heart attack Maternal Grandfather   . Cancer Paternal Grandmother   . Kidney Stones Sister   . Colon cancer Neg Hx   . Rectal cancer Neg Hx   . Esophageal cancer Neg Hx   . Liver cancer Neg Hx   . Stomach cancer Neg Hx     Social History   Socioeconomic History  . Marital status: Divorced    Spouse name: Not on file  . Number of children: 2  . Years of education: Not on file  .  Highest education level: Not on file  Occupational History  . Occupation: Field seismologist  Social Needs  . Financial resource strain: Not on file  . Food insecurity:    Worry: Not on file    Inability: Not on file  . Transportation needs:    Medical: Not on file    Non-medical: Not on file  Tobacco Use  . Smoking status: Former Research scientist (life sciences)  . Smokeless tobacco: Never Used  . Tobacco comment: social smoker- never a habit  Substance and Sexual Activity  . Alcohol use: Yes    Comment: occasionally   . Drug use: No  . Sexual activity: Not Currently  Lifestyle  . Physical activity:    Days per week: Not on file    Minutes per session: Not on file  . Stress: Not on file  Relationships  . Social connections:    Talks on phone: Not on file    Gets together: Not on file    Attends religious service: Not on file    Active member of club or organization: Not on file    Attends meetings of clubs or organizations: Not on file    Relationship status: Not on file  . Intimate partner violence:    Fear of current or ex partner: Not on file    Emotionally abused: Not on file    Physically abused: Not on file    Forced sexual activity: Not on file  Other Topics Concern  . Not on file  Social History Narrative  . Not on file    Review of Systems:    Constitutional: No weight loss, fever or chills Cardiovascular: No chest pain Respiratory: No SOB  Gastrointestinal: See HPI and otherwise negative   Physical Exam:   Vital signs: BP (!) 144/78   Pulse 68   Ht 5' 6.5" (1.689 m)   Wt 227 lb (103 kg)   BMI 36.09 kg/m   Constitutional:   Pleasant Caucasian female appears to be in NAD, Well developed, Well nourished, alert and cooperative Respiratory: Respirations even and unlabored. Lungs clear to auscultation bilaterally.   No wheezes, crackles, or rhonchi.  Cardiovascular: Normal S1, S2. No MRG. Regular rate and rhythm. No peripheral edema, cyanosis or pallor.  Gastrointestinal:  Soft, nondistended, nontender. No rebound or guarding. Normal bowel sounds. No appreciable masses or hepatomegaly. Psychiatric: Demonstrates good judgement and reason without abnormal affect or behaviors.  MOST RECENT LABS AND IMAGING: CBC    Component Value Date/Time   WBC 5.7 05/06/2018 0959   RBC 4.65 05/06/2018 0959   HGB 14.2 05/06/2018 0959   HCT 42.0 05/06/2018 0959   PLT 202 05/06/2018 0959   MCV 90.3 05/06/2018 0959   MCH 30.5 05/06/2018 0959   MCHC 33.8 05/06/2018 0959   RDW 12.3 05/06/2018 0959   LYMPHSABS 1,596 05/06/2018 0959   MONOABS 350 08/21/2016 0850   EOSABS 68 05/06/2018 0959   BASOSABS 23 05/06/2018 0959    CMP     Component Value Date/Time   NA 141 05/06/2018 0959   K 5.0 05/06/2018 0959   CL 105 05/06/2018 0959   CO2 30 05/06/2018 0959   GLUCOSE 100 (H) 05/06/2018 0959   BUN 15 05/06/2018 0959   CREATININE 0.91 05/06/2018 0959   CALCIUM 9.8 05/06/2018 0959   PROT 6.7 05/06/2018 0959   ALBUMIN 3.8 08/21/2016 0850   AST 14 05/06/2018 0959   ALT 14 05/06/2018 0959   ALKPHOS 57 08/21/2016 0850   BILITOT 0.3 05/06/2018 0959  GFRNONAA >60 04/22/2016 0327   GFRNONAA 60 04/11/2015 1656   GFRAA >60 04/22/2016 0327   GFRAA 69 04/11/2015 1656    Assessment: 1.  GERD: Only moderately controlled on Prilosec 40 mg daily, has been tried on various other agents in the past; consider PUD versus H. pylori versus other 2.  Hoarseness: For the past year with above 3.  Thrush: For the past 2  months, has been treated once with nystatin  Plan: 1.  Patient symptoms have been going on for a year now on various acid reducers.  Recommend she have an EGD for further evaluation.  Did discuss risks, benefits, limitations and alternatives and the patient agrees to proceed.  This was scheduled with Dr. Ardis Hughs in [facility]. 2.  Patient to continue Prilosec 40 mg daily, 30-60 minutes before breakfast.  Explained that pending findings of above this may need to be increased for period of time. 3.  Reviewed antireflux diet and lifestyle modifications. 4.  Patient to follow in clinic per recommendations from Dr. Ardis Hughs after time of procedure.  Crystal Newer, PA-C [facility] 05/28/2018, 8:19 AM  Cc: Alycia Rossetti, MD

## 2018-05-28 NOTE — Progress Notes (Signed)
I agree with the above note, plan 

## 2018-05-29 LAB — TSH: TSH: 2.54 mIU/L (ref 0.40–4.50)

## 2018-05-29 LAB — T4, FREE: FREE T4: 1.5 ng/dL (ref 0.8–1.8)

## 2018-05-30 ENCOUNTER — Telehealth: Payer: Self-pay | Admitting: *Deleted

## 2018-05-30 NOTE — Telephone Encounter (Signed)
Received request from pharmacy for PA on Adderall.   PA submitted.   Dx: F90.0- ADD

## 2018-05-30 NOTE — Telephone Encounter (Signed)
Received PA determination.   Case ID [id number] approved 04/30/2018- 05/30/2019.

## 2018-06-02 ENCOUNTER — Other Ambulatory Visit: Payer: Self-pay

## 2018-06-02 MED ORDER — LEVOTHYROXINE SODIUM 137 MCG PO CAPS: 137.0000 ug | ORAL_CAPSULE | Freq: Every day | ORAL | 0 refills | Status: DC

## 2018-06-08 ENCOUNTER — Telehealth: Payer: Self-pay

## 2018-06-08 NOTE — Telephone Encounter (Signed)
Covid-19 travel screening questions  Have you traveled in the last 14 days? No. If yes where?  Do you now or have you had a fever in the last 14 days? No.  Do you have any respiratory symptoms of shortness of breath or cough now or in the last 14 days? No.  Do you have a medical history of Congestive Heart Failure?  Do you have a medical history of lung disease?  Do you have any family members or close contacts with diagnosed or suspected Covid-19? No.   Patient is aware of our "No care partner in lobby" policy and understands. She had no further questions.

## 2018-06-09 ENCOUNTER — Ambulatory Visit (AMBULATORY_SURGERY_CENTER): Payer: BLUE CROSS/BLUE SHIELD | Admitting: Gastroenterology

## 2018-06-09 ENCOUNTER — Other Ambulatory Visit: Payer: Self-pay

## 2018-06-09 ENCOUNTER — Encounter: Payer: Self-pay | Admitting: Gastroenterology

## 2018-06-09 VITALS — BP 126/62 | HR 66 | Temp 98.4°F | Resp 15 | Ht 66.0 in | Wt 227.0 lb

## 2018-06-09 DIAGNOSIS — K3189 Other diseases of stomach and duodenum: Secondary | ICD-10-CM

## 2018-06-09 DIAGNOSIS — K449 Diaphragmatic hernia without obstruction or gangrene: Secondary | ICD-10-CM | POA: Diagnosis not present

## 2018-06-09 DIAGNOSIS — K2970 Gastritis, unspecified, without bleeding: Secondary | ICD-10-CM

## 2018-06-09 DIAGNOSIS — K219 Gastro-esophageal reflux disease without esophagitis: Secondary | ICD-10-CM | POA: Diagnosis not present

## 2018-06-09 MED ORDER — SODIUM CHLORIDE 0.9 % IV SOLN: 500.0000 mL | Freq: Once | INTRAVENOUS | Status: DC

## 2018-06-09 NOTE — Progress Notes (Signed)
PT taken to PACU. Monitors in place. VSS. Report given to RN. 

## 2018-06-09 NOTE — Progress Notes (Signed)
Called to room to assist during endoscopic procedure.  Patient ID and intended procedure confirmed with present staff. Received instructions for my participation in the procedure from the performing physician.  

## 2018-06-09 NOTE — Patient Instructions (Signed)
Handout on gastritis given. Continue prilosec in am shortly before breakfast and activia once daily  YOU HAD AN ENDOSCOPIC PROCEDURE TODAY AT [facility]:   Refer to the procedure report that was given to you for any specific questions about what was found during the examination.  If the procedure report does not answer your questions, please call your gastroenterologist to clarify.  If you requested that your care partner not be given the details of your procedure findings, then the procedure report has been included in a sealed envelope for you to review at your convenience later.  YOU SHOULD EXPECT: Some feelings of bloating in the abdomen. Passage of more gas than usual.  Walking can help get rid of the air that was put into your GI tract during the procedure and reduce the bloating. If you had a lower endoscopy (such as a colonoscopy or flexible sigmoidoscopy) you may notice spotting of blood in your stool or on the toilet paper. If you underwent a bowel prep for your procedure, you may not have a normal bowel movement for a few days.  Please Note:  You might notice some irritation and congestion in your nose or some drainage.  This is from the oxygen used during your procedure.  There is no need for concern and it should clear up in a day or so.  SYMPTOMS TO REPORT IMMEDIATELY:    Following upper endoscopy (EGD)  Vomiting of blood or coffee ground material  New chest pain or pain under the shoulder blades  Painful or persistently difficult swallowing  New shortness of breath  Fever of 100F or higher  Black, tarry-looking stools  For urgent or emergent issues, a gastroenterologist can be reached at any hour by calling [phone].   DIET:  We do recommend a small meal at first, but then you may proceed to your regular diet.  Drink plenty of fluids but you should avoid alcoholic beverages for 24 hours.  ACTIVITY:  You should plan to take it easy for the rest of today  and you should NOT DRIVE or use heavy machinery until tomorrow (because of the sedation medicines used during the test).    FOLLOW UP: Our staff will call the number listed on your records the next business day following your procedure to check on you and address any questions or concerns that you may have regarding the information given to you following your procedure. If we do not reach you, we will leave a message.  However, if you are feeling well and you are not experiencing any problems, there is no need to return our call.  We will assume that you have returned to your regular daily activities without incident.  If any biopsies were taken you will be contacted by phone or by letter within the next 1-3 weeks.  Please call us at [phone] if you have not heard about the biopsies in 3 weeks.    SIGNATURES/CONFIDENTIALITY: You and/or your care partner have signed paperwork which will be entered into your electronic medical record.  These signatures attest to the fact that that the information above on your After Visit Summary has been reviewed and is understood.  Full responsibility of the confidentiality of this discharge information lies with you and/or your care-partner.

## 2018-06-09 NOTE — Op Note (Signed)
[facility] Patient Name: Crystal Waters Procedure Date: 06/09/2018 8:29 AM MRN: [id number] Endoscopist: Milus Banister , MD Age: 63 Referring MD:  Date of Birth: 1955-06-22 Gender: Female Account #: [id number] Procedure:                Upper GI endoscopy Indications:              Suspected gastro-esophageal reflux disease Medicines:                Monitored Anesthesia Care Procedure:                Pre-Anesthesia Assessment:                           - Prior to the procedure, a History and Physical                            was performed, and patient medications and                            allergies were reviewed. The patient's tolerance of                            previous anesthesia was also reviewed. The risks                            and benefits of the procedure and the sedation                            options and risks were discussed with the patient.                            All questions were answered, and informed consent                            was obtained. Prior Anticoagulants: The patient has                            taken no previous anticoagulant or antiplatelet                            agents. ASA Grade Assessment: II - A patient with                            mild systemic disease. After reviewing the risks                            and benefits, the patient was deemed in                            satisfactory condition to undergo the procedure.                           After obtaining informed consent, the endoscope was  passed under direct vision. Throughout the                            procedure, the patient's blood pressure, pulse, and                            oxygen saturations were monitored continuously. The                            Model GIF-[id number]) scope was introduced                            through the mouth, and advanced to the second part                            of  duodenum. The upper GI endoscopy was                            accomplished without difficulty. The patient                            tolerated the procedure well. Scope In: Scope Out: Findings:                 Mild inflammation characterized by erythema and                            granularity was found in the gastric antrum.                            Biopsies were taken with a cold forceps for                            histology.                           A small hiatal hernia was present.                           The exam was otherwise without abnormality. Complications:            No immediate complications. Estimated blood loss:                            None. Estimated Blood Loss:     Estimated blood loss: none. Impression:               - Gastritis. Biopsied to check for H. pylori.                           - Small hiatal hernia.                           - The examination was otherwise normal. Recommendation:           - Patient has a contact number available for  emergencies. The signs and symptoms of potential                            delayed complications were discussed with the                            patient. Return to normal activities tomorrow.                            Written discharge instructions were provided to the                            patient.                           - Resume previous diet.                           - Continue present medications. Continue the                            prilosec in AM shortly before breakfast and activia                            once daily.                           - Await pathology results. Milus Banister, MD 06/09/2018 8:44:58 AM This report has been signed electronically.

## 2018-06-09 NOTE — Progress Notes (Signed)
Pt's states no medical or surgical changes since previsit or office visit. 

## 2018-06-10 ENCOUNTER — Telehealth: Payer: Self-pay

## 2018-06-10 NOTE — Telephone Encounter (Signed)
  Follow up Call-  Call back number 06/09/2018  Post procedure Call Back phone  # [phone]  Permission to leave phone message Yes  Some recent data might be hidden     Patient questions:  Do you have a fever, pain , or abdominal swelling? No. Pain Score  0 *  Have you tolerated food without any problems? Yes.    Have you been able to return to your normal activities? Yes.    Do you have any questions about your discharge instructions: Diet   No. Medications  No. Follow up visit  No.  Do you have questions or concerns about your Care? No.  Actions: * If pain score is 4 or above: No action needed, pain <4.

## 2018-06-13 ENCOUNTER — Encounter: Payer: BLUE CROSS/BLUE SHIELD | Admitting: Gastroenterology

## 2018-06-16 ENCOUNTER — Encounter: Payer: Self-pay | Admitting: Gastroenterology

## 2018-06-17 ENCOUNTER — Other Ambulatory Visit: Payer: Self-pay | Admitting: "Endocrinology

## 2018-06-23 ENCOUNTER — Telehealth: Payer: Self-pay | Admitting: Gastroenterology

## 2018-06-23 NOTE — Telephone Encounter (Signed)
The pt was advised a letter was mailed and we discussed at length. All her questions answered

## 2018-06-23 NOTE — Telephone Encounter (Signed)
Pt called to inquire about results from EGD.

## 2018-06-25 ENCOUNTER — Encounter: Payer: Self-pay | Admitting: Family Medicine

## 2018-06-25 ENCOUNTER — Ambulatory Visit: Payer: BLUE CROSS/BLUE SHIELD | Admitting: "Endocrinology

## 2018-06-30 ENCOUNTER — Ambulatory Visit: Payer: BLUE CROSS/BLUE SHIELD | Admitting: "Endocrinology

## 2018-07-02 ENCOUNTER — Ambulatory Visit (INDEPENDENT_AMBULATORY_CARE_PROVIDER_SITE_OTHER): Payer: BLUE CROSS/BLUE SHIELD | Admitting: "Endocrinology

## 2018-07-02 ENCOUNTER — Encounter: Payer: Self-pay | Admitting: "Endocrinology

## 2018-07-02 ENCOUNTER — Other Ambulatory Visit: Payer: Self-pay | Admitting: Family Medicine

## 2018-07-02 ENCOUNTER — Other Ambulatory Visit: Payer: Self-pay

## 2018-07-02 DIAGNOSIS — E890 Postprocedural hypothyroidism: Secondary | ICD-10-CM

## 2018-07-02 MED ORDER — AMPHETAMINE-DEXTROAMPHET ER 20 MG PO CP24: 20.0000 mg | ORAL_CAPSULE | Freq: Every day | ORAL | 0 refills | Status: DC

## 2018-07-02 MED ORDER — LEVOTHYROXINE SODIUM 137 MCG PO CAPS: ORAL_CAPSULE | ORAL | 1 refills | Status: DC

## 2018-07-02 NOTE — Telephone Encounter (Signed)
Ok to refill??  Last office visit 05/06/2018.  Last refill 05/28/2018.

## 2018-07-02 NOTE — Progress Notes (Signed)
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 PMD:  Alycia Rossetti, MD.   Past Medical History:  Diagnosis Date  . Allergy    gluten, seasonal  . Anxiety   . Arthritis   . Asthma    seasonal   . Cancer ([place])    Thyroid  . Complication of anesthesia   . Constipation   . Depression   . GERD (gastroesophageal reflux disease)   . Gestational diabetes mellitus    with 1 child.    . H/O nose injury    accidently hit in the nose, has a stuffy nose, can't lie flat, wears nasal strips at night  . History of kidney stones      x 2 passed  . Hypertension   . Hypothyroidism   . PONV (postoperative nausea and vomiting)   . PTSD (post-traumatic stress disorder)   . Thyroid cancer ([place])   . Thyroid disease    Past Surgical History:  Procedure Laterality Date  . CHOLECYSTECTOMY N/A 04/11/2016   Procedure: LAPAROSCOPIC CHOLECYSTECTOMY WITH INTRAOPERATIVE CHOLANGIOGRAM;  Surgeon: Georganna Skeans, MD;  Location: [facility];  Service: General;  Laterality: N/A;  . COLONOSCOPY    . KNEE SURGERY Right 2009  . LAPAROTOMY N/A 04/17/2016   Procedure: LAPAROTOMY REPAIR OF INCARCERATED INCISIONAL HERNIA WITH VAC PLACEMENT;  Surgeon: Rolm Bookbinder, MD;  Location: [facility];  Service: General;  Laterality: N/A;  . THYROID SURGERY     2001  . TUBAL LIGATION     Social History   Socioeconomic History  . Marital status: Divorced    Spouse name: Not on file  . Number of children: 2  . Years of education: Not on file  . Highest education level: Not on file  Occupational History  . Occupation: Field seismologist  Social Needs  . Financial resource strain: Not on  file  . Food insecurity:    Worry: Not on file    Inability: Not on file  . Transportation needs:    Medical: Not on file    Non-medical: Not on file  Tobacco Use  . Smoking status: Former Research scientist (life sciences)  . Smokeless tobacco: Never Used  . Tobacco comment: social smoker- never a habit  Substance and Sexual Activity  . Alcohol use: Yes    Comment: occasionally   . Drug use: No  . Sexual activity: Not Currently  Lifestyle  . Physical  activity:    Days per week: Not on file    Minutes per session: Not on file  . Stress: Not on file  Relationships  . Social connections:    Talks on phone: Not on file    Gets together: Not on file    Attends religious service: Not on file    Active member of club or organization: Not on file    Attends meetings of clubs or organizations: Not on file    Relationship status: Not on file  Other Topics Concern  . Not on file  Social History Narrative  . Not on file   Outpatient Encounter Medications as of 07/02/2018  Medication Sig  . acetaminophen (TYLENOL) 500 MG tablet Take 500 mg every 8 (eight) hours as needed by mouth.  . Cholecalciferol (VITAMIN D) 2000 UNITS tablet Take 2,000 Units by mouth daily.  . diclofenac sodium (VOLTAREN) 1 % GEL PA [id number] approved 03/25/2018- 04/24/2019.  . ibuprofen (ADVIL,MOTRIN) 200 MG tablet Take 400 mg by mouth every 8 (eight) hours as needed for mild pain or moderate pain.  . Levothyroxine Sodium (TIROSINT) 137 MCG CAPS TAKE 1 CAPSULE (137 MCG TOTAL) BY MOUTH DAILY BEFORE BREAKFAST.  Marland Kitchen lisinopril (PRINIVIL,ZESTRIL) 10 MG tablet TAKE 1 TABLET (10 MG TOTAL) BY MOUTH DAILY.  Marland Kitchen loratadine (CLARITIN) 10 MG tablet Take 10 mg by mouth daily.  Marland Kitchen omeprazole (PRILOSEC) 40 MG capsule TAKE 1 CAPSULE BY MOUTH EVERY DAY  . ondansetron (ZOFRAN ODT) 4 MG disintegrating tablet Take 1 tablet (4 mg total) by mouth every 8 (eight) hours as needed for nausea or vomiting.  Marland Kitchen OVER THE COUNTER MEDICATION Nasal spay qhs  . Probiotic  Product (ALIGN) 4 MG CAPS Take 1 capsule by mouth daily.  . temazepam (RESTORIL) 15 MG capsule Take 1 capsule (15 mg total) by mouth at bedtime as needed for sleep.  . traMADol (ULTRAM) 50 MG tablet Take 1 tablet (50 mg total) by mouth 2 (two) times daily as needed.  . triamcinolone cream (KENALOG) 0.1 % Apply 1 application topically 2 (two) times daily. (Patient taking differently: Apply 1 application as needed topically. )  . [DISCONTINUED] amphetamine-dextroamphetamine (ADDERALL XR) 20 MG 24 hr capsule Take 1 capsule (20 mg total) by mouth daily.  . [DISCONTINUED] TIROSINT 137 MCG CAPS TAKE 1 CAPSULE (137 MCG TOTAL) BY MOUTH DAILY BEFORE BREAKFAST.   No facility-administered encounter medications on file as of 07/02/2018.    ALLERGIES: Allergies  Allergen Reactions  . Gluten Meal Rash  . Zoloft [Sertraline Hcl] Other (See Comments)    STOMACH ACHES   VACCINATION STATUS: Immunization History  Administered Date(s) Administered  . Influenza,inj,Quad PF,6+ Mos 11/22/2015, 03/05/2017, 04/07/2018    HPI   Crystal Waters  is a patient with the above medical history.  Her prior thyroid records are not available to review.  Her thyroid history starts in April 2001 when she underwent total thyroidectomy for thyroid malignancy.  She recalls being treated with radioactive iodine remnant ablation following her surgery.  After her last visit, she underwent thyroid/neck ultrasound which reveals no remnant thyroid tissue or masses or signs of recurrence in the thyroid bed/neck.   -She was switched to Tirosint 125 mcg p.o. every morning after she reported significant intolerance to levothyroxine, Synthroid and having problem regulating her thyroid function tests using [facility].   -She presents with improved clinical feeling, weight loss, better energy, and tolerating her Tirosint.   She denies palpitations, tremors, irritability. She denies family history of  thyroid malignancy, however thyroid  dysfunction in multiple family members including her mother and her siblings.  -She denies feeling nodules in neck, hoarseness, dysphagia/odynophagia, SOB with lying down.  No h/o radiation tx to head or neck.   I reviewed her chart and she also has a hypertension on treatment, GERD on Prilosec.    ROS:   Physical Exam: There were no vitals taken for this visit. Wt Readings from Last 3 Encounters:  06/09/18 227 lb (103 kg)  05/28/18 227 lb (103 kg)  05/06/18 227 lb (103 kg)     Diabetic Labs (most recent): Lab Results  Component Value Date   HGBA1C 5.4 04/24/2017   HGBA1C 5.3 05/15/2016   HGBA1C 5.4 11/22/2015     Lipid Panel ( most recent) Lipid Panel     Component Value Date/Time   CHOL 213 (H) 05/06/2018 0959   TRIG 111 05/06/2018 0959   HDL 64 05/06/2018 0959   CHOLHDL 3.3 05/06/2018 0959   VLDL 34 (H) 05/15/2016 1143   LDLCALC 127 (H) 05/06/2018 0959     Recent Results (from the past 2160 hour(s))  CBC with Differential/Platelet     Status: None   Collection Time: 05/06/18  9:59 AM  Result Value Ref Range   WBC 5.7 3.8 - 10.8 Thousand/uL   RBC 4.65 3.80 - 5.10 Million/uL   Hemoglobin 14.2 11.7 - 15.5 g/dL   HCT 42.0 35.0 - 45.0 %   MCV 90.3 80.0 - 100.0 fL   MCH 30.5 27.0 - 33.0 pg   MCHC 33.8 32.0 - 36.0 g/dL   RDW 12.3 11.0 - 15.0 %   Platelets 202 140 - 400 Thousand/uL   MPV 11.6 7.5 - 12.5 fL   Neutro Abs 3,551 1,500 - 7,800 cells/uL   Lymphs Abs 1,596 850 - 3,900 cells/uL   Absolute Monocytes 462 200 - 950 cells/uL   Eosinophils Absolute 68 15 - 500 cells/uL   Basophils Absolute 23 0 - 200 cells/uL   Neutrophils Relative % 62.3 %   Total Lymphocyte 28.0 %   Monocytes Relative 8.1 %   Eosinophils Relative 1.2 %   Basophils Relative 0.4 %  Comprehensive metabolic panel     Status: Abnormal   Collection Time: 05/06/18  9:59 AM  Result Value Ref Range   Glucose, Bld 100 (H) 65 - 99 mg/dL    Comment: .            Fasting reference  interval . For someone without known diabetes, a glucose value between 100 and 125 mg/dL is consistent with prediabetes and should be confirmed with a follow-up test. .    BUN 15 7 - 25 mg/dL   Creat 0.91 0.50 - 0.99 mg/dL    Comment: For patients >17 years of age, the reference limit for Creatinine is approximately 13% higher for people identified as African-American. .    BUN/Creatinine Ratio NOT APPLICABLE 6 - 22 (calc)   Sodium 141 135 - 146 mmol/L   Potassium 5.0 3.5 - 5.3 mmol/L   Chloride 105 98 - 110 mmol/L   CO2 30 20 - 32 mmol/L   Calcium 9.8 8.6 - 10.4 mg/dL   Total Protein 6.7 6.1 - 8.1 g/dL   Albumin 4.1 3.6 - 5.1 g/dL   Globulin 2.6 1.9 - 3.7 g/dL (calc)   AG Ratio 1.6 1.0 - 2.5 (calc)   Total Bilirubin 0.3 0.2 - 1.2 mg/dL   Alkaline phosphatase (APISO) 49 37 - 153 U/L  AST 14 10 - 35 U/L   ALT 14 6 - 29 U/L  Lipid panel     Status: Abnormal   Collection Time: 05/06/18  9:59 AM  Result Value Ref Range   Cholesterol 213 (H) <200 mg/dL   HDL 64 > OR = 50 mg/dL   Triglycerides 111 <150 mg/dL   LDL Cholesterol (Calc) 127 (H) mg/dL (calc)    Comment: Reference range: <100 . Desirable range <100 mg/dL for primary prevention;   <70 mg/dL for patients with CHD or diabetic patients  with > or = 2 CHD risk factors. Marland Kitchen LDL-C is now calculated using the Martin-Hopkins  calculation, which is a validated novel method providing  better accuracy than the Friedewald equation in the  estimation of LDL-C.  Cresenciano Genre et al. Annamaria Helling. 2229;798(92): 2061-2068  (http://education.[facility].com/faq/FAQ164)    Total CHOL/HDL Ratio 3.3 <5.0 (calc)   Non-HDL Cholesterol (Calc) 149 (H) <130 mg/dL (calc)    Comment: For patients with diabetes plus 1 major ASCVD risk  factor, treating to a non-HDL-C goal of <100 mg/dL  (LDL-C of <70 mg/dL) is considered a therapeutic  option.   TSH     Status: None   Collection Time: 05/28/18  9:16 AM  Result Value Ref Range   TSH 2.54  0.40 - 4.50 mIU/L  T4, free     Status: None   Collection Time: 05/28/18  9:16 AM  Result Value Ref Range   Free T4 1.5 0.8 - 1.8 ng/dL      ASSESSMENT: 1. Postsurgical Hypothyroidism 2. History of thyroid malignancy  PLAN:    Patient with long-standing postsurgical hypothyroidism after thyroidectomy for thyroid cancer in April 2001.  -Her previsit thyroid function tests are consistent with appropriate replacement.  Prior to her last visit thyroid ultrasound is unremarkable for surgically absent thyroid tissue and no sign of recurrence.  Per her description, her initial treatment for thyroid malignancy appears to be complete.    Her treatment was completed approximately 18 years ago-she is likely in remission.  She will not need further intervention for history of thyroid malignancy at this time.  -Her previsit thyroid function tests are consistent with slight under replacement with Tirosint. -Advised to continue Tirosint  137 mcg p.o. daily before breakfast.  - We discussed about the correct intake of her thyroid hormone, on empty stomach at fasting, with water, separated by at least 30 minutes from breakfast and other medications,  and separated by more than 4 hours from calcium, iron, multivitamins, acid reflux medications (PPIs). -Patient is made aware of the fact that thyroid hormone replacement is needed for life, dose to be adjusted by periodic monitoring of thyroid function tests.    Return in about 6 months (around 01/01/2019) for Follow up with Pre-visit Labs.  Glade Lloyd, MD [facility] [address] [place], [place] [address] Phone: [phone]  Fax: [phone]   07/02/2018, 3:06 PM  This note was partially dictated with voice recognition software. Similar sounding words can be transcribed inadequately or may not  be corrected upon review.

## 2018-07-17 ENCOUNTER — Encounter: Payer: Self-pay | Admitting: Family Medicine

## 2018-07-17 MED ORDER — TAMSULOSIN HCL 0.4 MG PO CAPS: 0.4000 mg | ORAL_CAPSULE | Freq: Every day | ORAL | 0 refills | Status: DC

## 2018-07-24 ENCOUNTER — Encounter: Payer: Self-pay | Admitting: Family Medicine

## 2018-07-25 ENCOUNTER — Encounter: Payer: Self-pay | Admitting: Family Medicine

## 2018-07-25 ENCOUNTER — Ambulatory Visit (INDEPENDENT_AMBULATORY_CARE_PROVIDER_SITE_OTHER): Payer: BLUE CROSS/BLUE SHIELD | Admitting: Family Medicine

## 2018-07-25 ENCOUNTER — Other Ambulatory Visit: Payer: Self-pay

## 2018-07-25 DIAGNOSIS — M170 Bilateral primary osteoarthritis of knee: Secondary | ICD-10-CM

## 2018-07-25 NOTE — Progress Notes (Signed)
Virtual Visit via Telephone Note  I connected with Crystal Waters on 07/25/18 at 11:11am  by telephone and verified that I am speaking with the correct person using two identifiers.      Pt location: at home   Physician location: at home, [place], Vic Blackbird MD     On call: patient and physician   I discussed the limitations, risks, security and privacy concerns of performing an evaluation and management service by telephone and the availability of in person appointments. I also discussed with the patient that there may be a patient responsible charge related to this service. The patient expressed understanding and agreed to proceed.   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 Her HR suggested she call for accomodations due to increase pain  She needs notation that she can sit in one place instead of work in 2 separate areas, needs  A chair at her teller station, needs a fatigue mat   She states there are many younger people that work there but she often feels overworked Estate manager/land agent are not trying to work with her   Observations/Objective: Unable to observe   Assessment and Plan: OAchronic knee pain- she needs surgical intervention which is documented but needs care for her autistic son and now in setting of COVID-19 can not have surgery at this time She would like to continue working with accomodation She does need a chair and to stay in 1 area to work as much as possible, this would be sedentary job for  her, also recommend fatigue MAT  Follow Up Instructions:  F/U end of June as scheduled    I discussed the assessment and treatment plan with the patient. The patient was provided an opportunity to ask questions and all were answered. The patient agreed with the plan and demonstrated an understanding of the instructions.   The patient was advised to call back or seek an in-person evaluation if the symptoms worsen or if the condition fails to improve as anticipated.  I provided 19 minutes of non-face-to-face time during this encounter. End time: 11:30am  Vic Blackbird, MD

## 2018-07-30 ENCOUNTER — Other Ambulatory Visit: Payer: Self-pay | Admitting: Family Medicine

## 2018-07-31 MED ORDER — TAMSULOSIN HCL 0.4 MG PO CAPS: 0.4000 mg | ORAL_CAPSULE | Freq: Every day | ORAL | 0 refills | Status: DC

## 2018-08-04 ENCOUNTER — Telehealth: Payer: Self-pay | Admitting: *Deleted

## 2018-08-04 NOTE — Telephone Encounter (Signed)
Received fax from [facility] for accomodations forms.   Call placed to patient for more information.   Job title: [employer] 07/25/2018  Reason accomodation requested: OA, knees  Verbalized that fee may be charged and is per provider prerogative.   Forms routed to provider.

## 2018-08-06 ENCOUNTER — Other Ambulatory Visit: Payer: Self-pay | Admitting: Family Medicine

## 2018-08-06 NOTE — Telephone Encounter (Signed)
Received completed accomodation forms from provider.   No charge per provider.  Faxed to [employer].

## 2018-08-14 ENCOUNTER — Encounter: Payer: Self-pay | Admitting: Family Medicine

## 2018-08-14 DIAGNOSIS — G4734 Idiopathic sleep related nonobstructive alveolar hypoventilation: Secondary | ICD-10-CM

## 2018-08-14 DIAGNOSIS — G4730 Sleep apnea, unspecified: Secondary | ICD-10-CM

## 2018-08-21 ENCOUNTER — Other Ambulatory Visit: Payer: Self-pay | Admitting: Family Medicine

## 2018-08-21 MED ORDER — AMPHETAMINE-DEXTROAMPHET ER 20 MG PO CP24: 20.0000 mg | ORAL_CAPSULE | Freq: Every day | ORAL | 0 refills | Status: DC

## 2018-08-21 NOTE — Telephone Encounter (Signed)
Requested Prescriptions   Pending Prescriptions Disp Refills  . amphetamine-dextroamphetamine (ADDERALL XR) 20 MG 24 hr capsule 30 capsule 0    Sig: Take 1 capsule (20 mg total) by mouth daily.   Last OV 05/06/2018 Last written 07/02/2018

## 2018-08-25 ENCOUNTER — Encounter: Payer: Self-pay | Admitting: Family Medicine

## 2018-08-25 ENCOUNTER — Telehealth: Payer: Self-pay | Admitting: Neurology

## 2018-08-25 NOTE — Telephone Encounter (Signed)

## 2018-08-26 NOTE — Telephone Encounter (Signed)
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   Forms routed to provider.

## 2018-09-02 NOTE — Telephone Encounter (Signed)
Pt returned my call. Pt's meds, allergies, and PMH were updated.  Pt reports that she had a sleep study "decades" ago but was never started on cpap. Pt does endorse snoring.  Pt reports that her weight is 226 lbs and she is 5'7.   Pt reports that her neck size is 13.75''.  Epworth Sleepiness Scale 0= would never doze 1= slight chance of dozing 2= moderate chance of dozing 3= high chance of dozing  Sitting and reading: 1 Watching TV: 1 Sitting inactive in a public place (ex. Theater or meeting): 0 As a passenger in a car for an hour without a break: 0 Lying down to rest in the afternoon: 1 Sitting and talking to someone: 0 Sitting quietly after lunch (no alcohol): 0 In a car, while stopped in traffic: 0 Total: 3  FSS: 54

## 2018-09-02 NOTE — Telephone Encounter (Signed)
I called pt to update her chart. No answer, left a message asking her to call me back. 

## 2018-09-03 ENCOUNTER — Encounter: Payer: Self-pay | Admitting: Neurology

## 2018-09-03 ENCOUNTER — Telehealth (INDEPENDENT_AMBULATORY_CARE_PROVIDER_SITE_OTHER): Payer: BC Managed Care – PPO | Admitting: Neurology

## 2018-09-03 DIAGNOSIS — Z820 Family history of epilepsy and other diseases of the nervous system: Secondary | ICD-10-CM

## 2018-09-03 DIAGNOSIS — G478 Other sleep disorders: Secondary | ICD-10-CM

## 2018-09-03 DIAGNOSIS — E669 Obesity, unspecified: Secondary | ICD-10-CM

## 2018-09-03 DIAGNOSIS — G4734 Idiopathic sleep related nonobstructive alveolar hypoventilation: Secondary | ICD-10-CM | POA: Diagnosis not present

## 2018-09-03 DIAGNOSIS — R0683 Snoring: Secondary | ICD-10-CM | POA: Diagnosis not present

## 2018-09-03 DIAGNOSIS — R351 Nocturia: Secondary | ICD-10-CM | POA: Diagnosis not present

## 2018-09-03 DIAGNOSIS — G479 Sleep disorder, unspecified: Secondary | ICD-10-CM

## 2018-09-03 DIAGNOSIS — G4700 Insomnia, unspecified: Secondary | ICD-10-CM

## 2018-09-03 NOTE — Progress Notes (Signed)
Star Age, MD, PhD [facility] [address] [place], [place] [address]   Virtual Visit via Video Note on 09/03/2018; I connected with [name] on 09/03/18 at  1:30 PM EDT by a video enabled telemedicine application and verified that I am speaking with the correct person using two identifiers.   I discussed the limitations of evaluation and management by telemedicine and the availability of in person appointments. The patient expressed understanding and agreed to proceed.  History of Present Illness: [name] is a 63 year old right-handed woman with an underlying medical history of thyroid cancer, PTSD, hypothyroidism, hypertension, kidney stones, reflux disease, depression, anxiety, arthritis, asthma, and obesity, who presents for a virtual, video based appointment via epic MyChart video visit for evaluation of her sleep disorder, in particular, concern for underlying obstructive sleep apnea.  The patient is unaccompanied today and joins via tablet from home, I am located in my office.  She is referred by her primary care physician, Dr. Buelah Manis, and I reviewed her virtual visit note from 07/25/2018. Her Epworth sleepiness score is 3 out of 24, fatigue severity score is 54 out of 63. She is on Adderall for ADD.  She drinks caffeine and limitation, 2 cups of coffee per day typically.  She does not typically drink soda or tea. She recently did an at home pulse oximetry test through a device that her brother-in-law had and there were several drops of oxygen saturation into the 80s, as low as 80, at which point the device started vibrating to wake her up.  She reports that her older son has sleep apnea and uses a CPAP machine faithfully, he lives in [place].  Her younger son lives with her, he has autism.  She has a small dog in the household, she does have a TV on at night in her bedroom, sometimes puts it on a timer but typically has it on through  the night.  She has a longstanding history of difficulty maintaining sleep.  She does fall asleep fairly well.  She has tried over-the-counter and prescription medication for sleep.  She does not recall all the names of the prescription sleep meds.  She has tried medication for anxiety as well.  She did try Ambien but it did not help that much.  She has tried over-the-counter melatonin.  She does snore.  She has occasionally woken up with a sense of gasping for air.  She does not recall dreaming.  She does not wake up rested.  She does not remember the last time she actually felt good after sleeping.  Bedtime is generally between 930 and 10 but she is not asleep until 11 or 1130 on most nights.  Rise time is variable.  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    Her Past Medical History Is Significant For: Past Medical History:  Diagnosis Date   Allergy    gluten, seasonal   Anxiety    Arthritis    Asthma    seasonal    Cancer ([place])    Thyroid   Complication of anesthesia    Constipation    Depression    GERD (gastroesophageal reflux disease)    Gestational diabetes mellitus    with 1 child.     H/O nose injury    accidently hit in the nose, has a stuffy nose, can't lie flat, wears nasal strips at night   History of kidney stones  x 2 passed   Hypertension    Hypothyroidism    PONV (postoperative nausea and vomiting)    PTSD (post-traumatic stress disorder)    Thyroid cancer ([place])    Thyroid disease     Her Past Surgical History Is Significant For: Past Surgical History:  Procedure Laterality Date   CHOLECYSTECTOMY N/A 04/11/2016   Procedure: LAPAROSCOPIC CHOLECYSTECTOMY WITH INTRAOPERATIVE CHOLANGIOGRAM;  Surgeon: Georganna Skeans, MD;  Location: [facility];  Service: General;  Laterality: N/A;   COLONOSCOPY     KNEE SURGERY Right 2009   LAPAROTOMY N/A 04/17/2016   Procedure: LAPAROTOMY REPAIR OF INCARCERATED INCISIONAL HERNIA WITH VAC PLACEMENT;  Surgeon:  Rolm Bookbinder, MD;  Location: [facility];  Service: General;  Laterality: N/A;   THYROID SURGERY     2001   TUBAL LIGATION      Her Family History Is Significant For: Family History  Problem Relation Age of Onset   Heart disease Mother    Diabetes Father    Heart disease Father    Hyperlipidemia Father    Hypertension Father    Diabetes Brother    Autism Son    Lupus Maternal Grandmother    Heart attack Maternal Grandfather    Cancer Paternal Grandmother    Kidney Stones Sister    Colon cancer Neg Hx    Rectal cancer Neg Hx    Esophageal cancer Neg Hx    Liver cancer Neg Hx    Stomach cancer Neg Hx     Her Social History Is Significant For: Social History   Socioeconomic History   Marital status: Divorced    Spouse name: Not on file   Number of children: 2   Years of education: Not on file   Highest education level: Not on file  Occupational History   Occupation: Child psychotherapist strain: Not on file   Food insecurity    Worry: Not on file    Inability: Not on file   Transportation needs    Medical: Not on file    Non-medical: Not on file  Tobacco Use   Smoking status: Former Smoker   Smokeless tobacco: Never Used   Tobacco comment: social smoker- never a habit  Substance and Sexual Activity   Alcohol use: Yes    Comment: occasionally    Drug use: No   Sexual activity: Not Currently  Lifestyle   Physical activity    Days per week: Not on file    Minutes per session: Not on file   Stress: Not on file  Relationships   Social connections    Talks on phone: Not on file    Gets together: Not on file    Attends religious service: Not on file    Active member of club or organization: Not on file    Attends meetings of clubs or organizations: Not on file    Relationship status: Not on file  Other Topics Concern   Not on file  Social History Narrative   Not on file    Her Allergies Are:    Allergies  Allergen Reactions   Gluten Meal Rash   Zoloft [Sertraline Hcl] Other (See Comments)    STOMACH ACHES  :   Her Current Medications Are:  Outpatient Encounter Medications as of 09/03/2018  Medication Sig   acetaminophen (TYLENOL) 500 MG tablet Take 500 mg every 8 (eight) hours as needed by mouth.   amphetamine-dextroamphetamine (ADDERALL XR) 20 MG 24 hr capsule  Take 1 capsule (20 mg total) by mouth daily.   Cholecalciferol (VITAMIN D) 2000 UNITS tablet Take 2,000 Units by mouth daily.   diclofenac sodium (VOLTAREN) 1 % GEL PA [id number] approved 03/25/2018- 04/24/2019.   ibuprofen (ADVIL,MOTRIN) 200 MG tablet Take 400 mg by mouth every 8 (eight) hours as needed for mild pain or moderate pain.   Levothyroxine Sodium (TIROSINT) 137 MCG CAPS TAKE 1 CAPSULE (137 MCG TOTAL) BY MOUTH DAILY BEFORE BREAKFAST.   lisinopril (PRINIVIL,ZESTRIL) 10 MG tablet TAKE 1 TABLET (10 MG TOTAL) BY MOUTH DAILY.   omeprazole (PRILOSEC) 40 MG capsule TAKE 1 CAPSULE BY MOUTH EVERY DAY   ondansetron (ZOFRAN ODT) 4 MG disintegrating tablet Take 1 tablet (4 mg total) by mouth every 8 (eight) hours as needed for nausea or vomiting.   OVER THE COUNTER MEDICATION Nasal spay qhs   Probiotic Product (ALIGN) 4 MG CAPS Take 1 capsule by mouth daily.   tamsulosin (FLOMAX) 0.4 MG CAPS capsule Take 1 capsule (0.4 mg total) by mouth daily.   temazepam (RESTORIL) 15 MG capsule Take 1 capsule (15 mg total) by mouth at bedtime as needed for sleep.   traMADol (ULTRAM) 50 MG tablet Take 1 tablet (50 mg total) by mouth 2 (two) times daily as needed.   triamcinolone cream (KENALOG) 0.1 % Apply 1 application topically 2 (two) times daily. (Patient taking differently: Apply 1 application as needed topically. )   No facility-administered encounter medications on file as of 09/03/2018.   :   Review of Systems:  Out of a complete 14 point review of systems, all are reviewed and negative with the exception of  these symptoms as listed below:  Observations/Objective: On examination, she is pleasant and conversant, in no acute distress, good comprehension and language skills, she is oriented.  Speech is clear without hypophonia, voice tremor or dysarthria noted.  Hearing is grossly intact.  Airway examination reveals a mild to moderately crowded airway secondary to small airway entry noted, tonsils appear to be small, uvula normal, Mallampati class II.  Tongue protrudes centrally in palate elevates symmetrically.  She has a minimal overbite.  She has adequate dental hygiene, mild mouth dryness is noted.  Shoulder height is equal.  Upper body mobility grossly normal, shoulder movements grossly normal, coordination grossly intact. Neck circumference by self-report is 13 and three-quarter inches.   Assessment and Plan: with an underlying medical history of thyroid cancer, PTSD, hypothyroidism, hypertension, kidney stones, reflux disease, depression, anxiety, arthritis, asthma, and obesity, who presents for a virtual, video based appointment via epic MyChart video visit for evaluation of her sleep disorder. The patient's medical history and physical exam (albeit limited with current video-based evaluation) are concerning for a diagnosis of obstructive sleep apnea. I discussed with the patient the diagnosis of OSA, its prognosis and treatment options. I explained in particular the risks and ramifications of untreated moderate to severe OSA, especially with respect to developing cardiovascular disease down the Road, including congestive heart failure, difficult to treat hypertension, cardiac arrhythmias, or stroke. Even type 2 diabetes has, in part, been linked to untreated OSA. Symptoms of untreated OSA may include daytime sleepiness, memory problems, mood irritability and mood disorder such as depression and anxiety, lack of energy, as well as recurrent headaches, especially morning headaches. We talked about the  importance of weight control. We talked about the importance of maintaining good sleep hygiene. I recommended the following at this time: sleep study. Some 10 years ago she attempted a sleep study but  could not sleep.  She is worried that a laboratory attended sleep study will not be easy for her to do.  Plus she would worry about her son at home.  She would prefer a home sleep test.  We will await insurance authorization as well and will call her. I explained the sleep test procedure to the patient and also outlined possible treatment options of OSA, including the use of a custom-made dental device (which would require a referral to a specialist dentist), upper airway surgical options, (such as UPPP, which would involve a referral to an ENT). I also explained the CPAP vs. AutoPAP treatment option to the patient, who indicated that she would be willing to try CPAP if the need arises. I answered all her questions today and the patient was in agreement. I plan to see the patient back after the sleep study is completed and encouraged her to call with any interim questions, concerns, problems or updates.   Star Age, MD, PhD    Follow Up Instructions:    I discussed the assessment and treatment plan with the patient. The patient was provided an opportunity to ask questions and all were answered. The patient agreed with the plan and demonstrated an understanding of the instructions.   The patient was advised to call back or seek an in-person evaluation if the symptoms worsen or if the condition fails to improve as anticipated.  I provided 30 minutes of non-face-to-face time during this encounter.   Star Age, MD

## 2018-09-03 NOTE — Patient Instructions (Signed)
Given verbally, during today's virtual video-based encounter, with verbal feedback received.   

## 2018-09-12 ENCOUNTER — Ambulatory Visit: Payer: BLUE CROSS/BLUE SHIELD | Admitting: Family Medicine

## 2018-09-12 ENCOUNTER — Encounter: Payer: Self-pay | Admitting: Family Medicine

## 2018-09-12 ENCOUNTER — Other Ambulatory Visit: Payer: Self-pay

## 2018-09-12 VITALS — BP 130/78 | HR 82 | Temp 98.9°F | Resp 14 | Ht 66.0 in | Wt 227.0 lb

## 2018-09-12 DIAGNOSIS — K219 Gastro-esophageal reflux disease without esophagitis: Secondary | ICD-10-CM

## 2018-09-12 DIAGNOSIS — Z6836 Body mass index (BMI) 36.0-36.9, adult: Secondary | ICD-10-CM

## 2018-09-12 DIAGNOSIS — F5104 Psychophysiologic insomnia: Secondary | ICD-10-CM

## 2018-09-12 DIAGNOSIS — F900 Attention-deficit hyperactivity disorder, predominantly inattentive type: Secondary | ICD-10-CM

## 2018-09-12 DIAGNOSIS — B372 Candidiasis of skin and nail: Secondary | ICD-10-CM

## 2018-09-12 DIAGNOSIS — I10 Essential (primary) hypertension: Secondary | ICD-10-CM

## 2018-09-12 MED ORDER — ONDANSETRON 4 MG PO TBDP: 4.0000 mg | ORAL_TABLET | Freq: Three times a day (TID) | ORAL | 2 refills | Status: DC | PRN

## 2018-09-12 MED ORDER — CLOTRIMAZOLE-BETAMETHASONE 1-0.05 % EX CREA: 1.0000 "application " | TOPICAL_CREAM | Freq: Two times a day (BID) | CUTANEOUS | 1 refills | Status: AC | PRN

## 2018-09-12 NOTE — Assessment & Plan Note (Signed)
Awaiting sleep study.

## 2018-09-12 NOTE — Progress Notes (Signed)
   Subjective:    Patient ID: Crystal Waters, female    DOB: [birth year], 63 y.o.   MRN: [id number]  Patient presents for Follow-up (is not fasting)   Pt here to f/u chronic medical problems     Medications reviewed    HTN- well controlled   She had telehealth consult with neurology for possible sleep apnea- she has repeat sleep study being done , off restoril for sleep, nothing has helped for her chronic insomnia, takes OTC vitamin some days   FOllowed by endocrinology for her thyroid , on stable amount of tyrisint 110mcg daily    Chronic abd pain- not having heartburn, still feels like she gets congestion, coming up in middle of night this as been going on for many month, she still gets some occ nausea   EGD done March 23 showed gastritis and hiatal hernia he is on omeprazole   OA- using tylenol, ultram did not help , also uses the topical    Urinating normally now     Takes adderall most days   Has recurrent rash on her buttocks, gets between the cheeks, she was using TAC cream, irritates like diaper rash has had many years on and off, but she often picks at that skin   Showed me pic of what looks like geographic tongue, gets on and off with certain foods - not currently active   Review Of Systems:  GEN- denies fatigue, fever, weight loss,weakness, recent illness HEENT- denies eye drainage, change in vision, nasal discharge, CVS- denies chest pain, palpitations RESP- denies SOB, cough, wheeze ABD- denies N/V, change in stools, abd pain GU- denies dysuria, hematuria, dribbling, incontinence MSK- +joint pain, muscle aches, injury Neuro- denies headache, dizziness, syncope, seizure activity       Objective:    BP 130/78   Pulse 82   Temp 98.9 F (37.2 C) (Oral)   Resp 14   Ht 5\' 6"  (1.676 m)   Wt 227 lb (103 kg)   SpO2 96%   BMI 36.64 kg/m  GEN- NAD, alert and oriented x3 HEENT- PERRL, EOMI, non injected sclera, pink conjunctiva, MMM, oropharynx clear Neck-  Supple, no thyromegaly CVS- RRR, no murmur RESP-CTAB ABD-NABS,soft,NT,ND Skin- mild erythema in gluteal cleft, few scarred macular spots on bilat cheeks, 1 with scab EXT- No edema Pulses- Radial, DP- 2+        Assessment & Plan:      Problem List Items Addressed This Visit      Unprioritized   ADD (attention deficit disorder)    Continue adderall      Candidiasis of skin    Given lotrisone Advised not to pick at skin, causes superinfection      Relevant Medications   clotrimazole-betamethasone (LOTRISONE) cream   Chronic insomnia    Awaiting sleep study      Essential hypertension, benign    Well controlled no changes       GERD (gastroesophageal reflux disease)    Continue PPI also had gastritis      Relevant Medications   ondansetron (ZOFRAN ODT) 4 MG disintegrating tablet   Obesity - Primary      Note: This dictation was prepared with Dragon dictation along with smaller phrase technology. Any transcriptional errors that result from this process are unintentional.

## 2018-09-12 NOTE — Patient Instructions (Signed)
F/U 4 months  

## 2018-09-12 NOTE — Assessment & Plan Note (Signed)
Continue PPI also had gastritis

## 2018-09-12 NOTE — Assessment & Plan Note (Signed)
Well controlled no changes 

## 2018-09-12 NOTE — Assessment & Plan Note (Signed)
Continue adderall  

## 2018-09-12 NOTE — Assessment & Plan Note (Signed)
Given lotrisone Advised not to pick at skin, causes superinfection

## 2018-09-15 ENCOUNTER — Ambulatory Visit: Payer: BLUE CROSS/BLUE SHIELD | Admitting: Family Medicine

## 2018-09-29 ENCOUNTER — Other Ambulatory Visit: Payer: Self-pay | Admitting: Family Medicine

## 2018-09-29 MED ORDER — AMPHETAMINE-DEXTROAMPHET ER 20 MG PO CP24: 20.0000 mg | ORAL_CAPSULE | Freq: Every day | ORAL | 0 refills | Status: DC

## 2018-09-29 NOTE — Telephone Encounter (Signed)
Ok to refill??  Last office visit 09/12/2018.  Last refill 08/21/2018.

## 2018-10-06 ENCOUNTER — Ambulatory Visit (INDEPENDENT_AMBULATORY_CARE_PROVIDER_SITE_OTHER): Payer: BC Managed Care – PPO | Admitting: Neurology

## 2018-10-06 DIAGNOSIS — G478 Other sleep disorders: Secondary | ICD-10-CM

## 2018-10-06 DIAGNOSIS — R351 Nocturia: Secondary | ICD-10-CM

## 2018-10-06 DIAGNOSIS — G4733 Obstructive sleep apnea (adult) (pediatric): Secondary | ICD-10-CM | POA: Diagnosis not present

## 2018-10-06 DIAGNOSIS — R0683 Snoring: Secondary | ICD-10-CM

## 2018-10-06 DIAGNOSIS — G479 Sleep disorder, unspecified: Secondary | ICD-10-CM

## 2018-10-06 DIAGNOSIS — G4700 Insomnia, unspecified: Secondary | ICD-10-CM

## 2018-10-06 DIAGNOSIS — Z820 Family history of epilepsy and other diseases of the nervous system: Secondary | ICD-10-CM

## 2018-10-06 DIAGNOSIS — E669 Obesity, unspecified: Secondary | ICD-10-CM

## 2018-10-06 DIAGNOSIS — G4734 Idiopathic sleep related nonobstructive alveolar hypoventilation: Secondary | ICD-10-CM

## 2018-10-09 ENCOUNTER — Telehealth: Payer: Self-pay

## 2018-10-09 NOTE — Addendum Note (Signed)
Addended by: Star Age on: 10/09/2018 08:54 AM   Modules accepted: Orders

## 2018-10-09 NOTE — Telephone Encounter (Signed)
I called Crystal Waters. I advised Crystal Waters that Dr. Rexene Alberts reviewed their sleep study results and found that Crystal Waters has mild osa. Dr. Rexene Alberts recommends that Crystal Waters start an auto pap at home. I reviewed PAP compliance expectations with the Crystal Waters. Crystal Waters is agreeable to starting a CPAP. I advised Crystal Waters that an order will be sent to a DME, [facility], and [facility] will call the Crystal Waters within about one week after they file with the Crystal Waters's insurance. [facility] will show the Crystal Waters how to use the machine, fit for masks, and troubleshoot the CPAP if needed. A follow up appt was made for insurance purposes with Dr. Rexene Alberts on 12/30/2018 at 3:00pm. Crystal Waters verbalized understanding to arrive 15 minutes early and bring their CPAP. A letter with all of this information in it will be mailed to the Crystal Waters as a reminder. I verified with the Crystal Waters that the address we have on file is correct. Crystal Waters verbalized understanding of results. Crystal Waters had no questions at this time but was encouraged to call back if questions arise. I have sent the order to [facility] and have received confirmation that they have received the order.

## 2018-10-09 NOTE — Progress Notes (Signed)
Patient referred by Dr. Buelah Manis, seen by me on 09/03/18 in [facility], [place] on 10/06/18.    Please call and notify the patient that the recent home sleep test showed obstructive sleep apnea. OSA is overall mild, but worth treating to see if she feels better after treatment. To that end I recommend treatment for this in the form of autoPAP, which means, that we don't have to bring her in for a sleep study with CPAP, but will let her try an autoPAP machine at home, through a DME company (of her choice, or as per insurance requirement). The DME representative will educate her on how to use the machine, how to put the mask on, etc. I have placed an order in the chart. Please send referral, talk to patient, send report to referring MD. We will need a FU in sleep clinic for 10 weeks post-PAP set up, please arrange that with me or one of our NPs. Thanks,   Star Age, MD, PhD [facility])

## 2018-10-09 NOTE — Telephone Encounter (Signed)
-----   Message from Star Age, MD sent at 10/09/2018  8:54 AM EDT ----- Patient referred by Dr. Buelah Manis, seen by me on 09/03/18 in [facility], [place] on 10/06/18.    Please call and notify the patient that the recent home sleep test showed obstructive sleep apnea. OSA is overall mild, but worth treating to see if she feels better after treatment. To that end I recommend treatment for this in the form of autoPAP, which means, that we don't have to bring her in for a sleep study with CPAP, but will let her try an autoPAP machine at home, through a DME company (of her choice, or as per insurance requirement). The DME representative will educate her on how to use the machine, how to put the mask on, etc. I have placed an order in the chart. Please send referral, talk to patient, send report to referring MD. We will need a FU in sleep clinic for 10 weeks post-PAP set up, please arrange that with me or one of our NPs. Thanks,   Star Age, MD, PhD [facility])

## 2018-10-09 NOTE — Procedures (Signed)
Patient Information     First Name: Crystal Last Name: Waters ID: [id number]  Birth Date: Oct 24, 1955 Age: 63 Gender: Female  Referring Provider: Alycia Rossetti, MD BMI: 36.5 (W=227 lb, H=5' 6'')  Neck Circ.:  14 '' Epworth:  3/24   Sleep Study Information    Study Date: Oct 06, 2018 S/H/A Version: 001.001.001.001 / 4.1.1528 / 102  History:     63 year old woman with a history of thyroid cancer, PTSD, hypothyroidism, hypertension, kidney stones, reflux disease, depression, anxiety, arthritis, asthma, and obesity, who reports snoring and oxygen desaturations during sleep.  Summary & Diagnosis:      OSA Recommendations:      This home sleep test demonstrates mild obstructive sleep apnea with a total AHI of 14.3/hour and O2 nadir of 88%. Given the patient's medical history and sleep related complaints, treatment with positive airway pressure is a reasonable option and can be achieved in the form of autoPAP titration/trial at home. A proper titration study with CPAP may be helpful or needed down the road, when considered safe. Please note, that untreated obstructive sleep apnea may carry additional perioperative morbidity. Patients with significant obstructive sleep apnea should receive perioperative PAP therapy and the surgeons and particularly the anesthesiologist should be informed of the diagnosis and the severity of the sleep disordered breathing. Patient will be reminded regarding compliance with her PAP machine and to be mindful of cleanliness with the equipment and timely with supply changes (i.e. changing filter, mask, hose, humidifier chamber on an ongoing basis as recommended, and cleaning parts that touch the face and nose daily, etc). The patient should be cautioned not to drive, work at heights, or operate dangerous or heavy equipment when tired or sleepy. Review and reiteration of good sleep hygiene measures should be pursued with any patient. Other treatment options include weight loss  along with avoiding the supine sleep position, or a dental device.  Other causes of the patient's symptoms, including circadian rhythm disturbances, an underlying mood disorder, medication effect and/or an underlying medical problem cannot be ruled out based on this test. Clinical correlation is recommended. The patient and her referring provider will be notified of the test results. The patient will be seen in follow up in sleep clinic at [facility], either for a face-to-face or virtual visit, whichever feasible and recommended at the time.  I certify that I have reviewed the raw data recording prior to the issuance of this report in accordance with the standards of the American Academy of Sleep Medicine (AASM).  Star Age, MD, PhD [facility]) Diplomat, ABPN (Neurology and Sleep)           Sleep Summary  Oxygen Saturation Statistics   Start Study Time: End Study Time: Total Recording Time:  10:18:52 PM   8:21:06 AM 10 h, 2 min  Total Sleep Time % REM of Sleep Time:  7 h, 6 min  15.6    Mean: 95 Minimum: 88 Maximum: 99  Mean of Desaturations Nadirs (%):   93  Oxygen Desaturation. %:   4-9 10-20 >20 Total  Events Number Total    46  2 95.8 4.2  0 0.0  48 100.0  Oxygen Saturation: <90 <=88 <85 <80 <70  Duration (minutes): Sleep % 0.1 0.0  0.1 0.0  0.0 0.0 0.0 0.0 0.0 0.0     Respiratory Indices      Total Events REM NREM All Night  pRDI:  116  pAHI:  93  ODI:  48  pAHIc:  19  % CSR: 0.0 24.4 23.0 10.8 1.8 17.0 13.2 7.0 3.1 17.9 14.3 7.4 3.0       Pulse Rate Statistics during Sleep (BPM)      Mean: 63 Minimum: 49 Maximum: 89    Indices are calculated using technically valid sleep time of  6 hrs, 29 min. Central-Indices are calculated using technically valid sleep time of  6  hrs, 18 min. pRDI/pAHI are calculated using oxi desaturations ? 3%  Body Position Statistics  Position Supine Prone Right Left Non-Supine  Sleep (min)  305.5 0.0 87.0 34.0 121.0  Sleep % 71.6 0.0 20.4 8.0 28.4  pRDI 17.4 N/A 10.3 44.4 19.0  pAHI 14.0 N/A 9.5 31.7 15.2  ODI 8.6 N/A 2.2 10.6 4.4     Snoring Statistics Snoring Level (dB) >40 >50 >60 >70 >80 >Threshold (45)  Sleep (min) 70.3 7.3 1.2 0.0 0.0 14.7  Sleep % 16.5 1.7 0.3 0.0 0.0 3.4    Mean: 41 dB Sleep Stages Chart                      pAHI=14.3                                  Mild              Moderate                    Severe                                                 5              15                    30

## 2018-10-10 ENCOUNTER — Encounter: Payer: Self-pay | Admitting: Family Medicine

## 2018-10-13 ENCOUNTER — Other Ambulatory Visit: Payer: Self-pay

## 2018-10-14 ENCOUNTER — Encounter: Payer: Self-pay | Admitting: Family Medicine

## 2018-10-14 ENCOUNTER — Ambulatory Visit: Payer: BC Managed Care – PPO | Admitting: Family Medicine

## 2018-10-14 VITALS — BP 140/68 | HR 86 | Temp 98.5°F | Resp 14 | Ht 66.0 in | Wt 228.0 lb

## 2018-10-14 DIAGNOSIS — F3342 Major depressive disorder, recurrent, in full remission: Secondary | ICD-10-CM

## 2018-10-14 DIAGNOSIS — F411 Generalized anxiety disorder: Secondary | ICD-10-CM | POA: Diagnosis not present

## 2018-10-14 DIAGNOSIS — Z566 Other physical and mental strain related to work: Secondary | ICD-10-CM

## 2018-10-14 NOTE — Assessment & Plan Note (Signed)
I believe the stress and high anxiety at work dealing with this other upper management has been building for quite some time.  She feels like she is often singled out.  She would benefit from psychotherapy at this time.  She is also dealing with the issues with her ex-husband and financial constraints.  I think she is too overwhelmed to work at this time and will also begun to this process with HR with regards to her upper management.  She is going to set up psychotherapy and let me know when that appointment will be.  She will continue her current medications for now.  I recommend that she be taken out of work for the next 6 weeks.  Were going to follow-up in a few weeks to see how she has been coping if her sleep has improved.  We will adjust her medications as needed

## 2018-10-14 NOTE — Patient Instructions (Signed)
Let me know who therapist   Tentative return to work 11/24/18.  F/U 4 weeks
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
## 2018-10-15 DIAGNOSIS — G4733 Obstructive sleep apnea (adult) (pediatric): Secondary | ICD-10-CM | POA: Diagnosis not present

## 2018-10-16 ENCOUNTER — Encounter: Payer: Self-pay | Admitting: Family Medicine

## 2018-10-23 ENCOUNTER — Telehealth: Payer: Self-pay | Admitting: Family Medicine

## 2018-10-23 NOTE — Telephone Encounter (Signed)
Awaiting forms

## 2018-10-23 NOTE — Telephone Encounter (Signed)
Received paperwork via fax for this patient  Will route to christina

## 2018-10-24 NOTE — Telephone Encounter (Signed)
Received fax from [employer] for [employer] forms.  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

## 2018-10-31 ENCOUNTER — Other Ambulatory Visit: Payer: Self-pay | Admitting: Family Medicine

## 2018-10-31 ENCOUNTER — Encounter: Payer: Self-pay | Admitting: Family Medicine

## 2018-10-31 MED ORDER — BUPROPION HCL ER (XL) 300 MG PO TB24: 300.0000 mg | ORAL_TABLET | Freq: Every day | ORAL | 2 refills | Status: DC

## 2018-10-31 MED ORDER — AMPHETAMINE-DEXTROAMPHET ER 20 MG PO CP24: 20.0000 mg | ORAL_CAPSULE | Freq: Every day | ORAL | 0 refills | Status: DC

## 2018-10-31 NOTE — Telephone Encounter (Signed)
Last office visit: 10/14/2018 Last refilled 09/29/2018

## 2018-11-04 ENCOUNTER — Other Ambulatory Visit: Payer: Self-pay

## 2018-11-04 ENCOUNTER — Ambulatory Visit (INDEPENDENT_AMBULATORY_CARE_PROVIDER_SITE_OTHER): Payer: BC Managed Care – PPO | Admitting: *Deleted

## 2018-11-04 DIAGNOSIS — Z23 Encounter for immunization: Secondary | ICD-10-CM

## 2018-11-06 MED ORDER — BUPROPION HCL ER (XL) 300 MG PO TB24: 300.0000 mg | ORAL_TABLET | Freq: Every day | ORAL | 2 refills | Status: DC

## 2018-11-07 ENCOUNTER — Encounter: Payer: Self-pay | Admitting: Family Medicine

## 2018-11-07 DIAGNOSIS — M25562 Pain in left knee: Secondary | ICD-10-CM | POA: Diagnosis not present

## 2018-11-07 DIAGNOSIS — M170 Bilateral primary osteoarthritis of knee: Secondary | ICD-10-CM | POA: Diagnosis not present

## 2018-11-07 DIAGNOSIS — M25561 Pain in right knee: Secondary | ICD-10-CM | POA: Diagnosis not present

## 2018-11-10 ENCOUNTER — Telehealth: Payer: Self-pay | Admitting: Family Medicine

## 2018-11-10 NOTE — Telephone Encounter (Signed)
Received medical records request from [facility]. Requested chart notes, labs, imaging and prescription histories from 10/18/2018- [employer].   Faxed requested information.

## 2018-11-10 NOTE — Telephone Encounter (Signed)
RECEIVED ST DISABILITY FORMS VIA FAX WILL ROUTE TO CHRISTINA

## 2018-11-11 ENCOUNTER — Ambulatory Visit: Payer: BC Managed Care – PPO | Admitting: Family Medicine

## 2018-11-11 ENCOUNTER — Encounter: Payer: Self-pay | Admitting: Family Medicine

## 2018-11-11 ENCOUNTER — Other Ambulatory Visit: Payer: Self-pay

## 2018-11-11 VITALS — BP 132/62 | HR 86 | Temp 99.5°F | Resp 14 | Ht 66.0 in | Wt 228.0 lb

## 2018-11-11 DIAGNOSIS — F3342 Major depressive disorder, recurrent, in full remission: Secondary | ICD-10-CM

## 2018-11-11 DIAGNOSIS — F5104 Psychophysiologic insomnia: Secondary | ICD-10-CM | POA: Diagnosis not present

## 2018-11-11 DIAGNOSIS — F411 Generalized anxiety disorder: Secondary | ICD-10-CM

## 2018-11-11 DIAGNOSIS — Z566 Other physical and mental strain related to work: Secondary | ICD-10-CM | POA: Diagnosis not present

## 2018-11-11 NOTE — Assessment & Plan Note (Signed)
She is in a very difficult position, she enjoys her work but feels targeted causing increased anxiety and now she is going through an in house investigation. She is more anxious today but has a meeting with investigators to go over her complaints this evening. In the midst of ongoing high anxiety, sleep issues, would recommend she stay out of work to complete these meetings, continue 1 on 1 psychotherapy and allow medications to take full effect. Will push return to work date back to Oct 5th, 2020 She will be re-evaluated in 4 weeks

## 2018-11-11 NOTE — Patient Instructions (Signed)
F/U 4 weeks  
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
## 2018-11-15 DIAGNOSIS — G4733 Obstructive sleep apnea (adult) (pediatric): Secondary | ICD-10-CM | POA: Diagnosis not present

## 2018-12-04 ENCOUNTER — Encounter: Payer: Self-pay | Admitting: Family Medicine

## 2018-12-05 ENCOUNTER — Encounter: Payer: Self-pay | Admitting: Family Medicine

## 2018-12-05 MED ORDER — ALPRAZOLAM 0.5 MG PO TABS: ORAL_TABLET | ORAL | 0 refills | Status: DC

## 2018-12-08 ENCOUNTER — Other Ambulatory Visit: Payer: Self-pay

## 2018-12-09 ENCOUNTER — Encounter: Payer: Self-pay | Admitting: Family Medicine

## 2018-12-09 ENCOUNTER — Ambulatory Visit: Payer: BC Managed Care – PPO | Admitting: Family Medicine

## 2018-12-09 VITALS — BP 130/68 | HR 88 | Temp 98.6°F | Resp 16 | Ht 66.0 in | Wt 232.0 lb

## 2018-12-09 DIAGNOSIS — F5104 Psychophysiologic insomnia: Secondary | ICD-10-CM | POA: Diagnosis not present

## 2018-12-09 DIAGNOSIS — F3342 Major depressive disorder, recurrent, in full remission: Secondary | ICD-10-CM | POA: Diagnosis not present

## 2018-12-09 DIAGNOSIS — F411 Generalized anxiety disorder: Secondary | ICD-10-CM

## 2018-12-09 MED ORDER — ALPRAZOLAM 0.5 MG PO TABS: ORAL_TABLET | ORAL | 1 refills | Status: DC

## 2018-12-09 NOTE — Patient Instructions (Addendum)
Use xanax for sleep Continue wellbutrin in the morning F/U as previous

## 2018-12-09 NOTE — Assessment & Plan Note (Addendum)
Major depressive disorder along with generalized anxiety and insomnia.  This is been worsened by her work environment.  Unfortunately there is something we can do particular about the gentleman that is still working and she has not heard anything from her higher up.  She has been following with her psychotherapist which she does feel has been beneficial.  I will add alprazolam for her to use in the evening time to help with anxiety and her sleep.  She will continue her Wellbutrin at 300 mg.  At this point organ to release her back to work on October 5 and she will see how she does when she goes back.  I will see her about 3 to 4 weeks after she returns to work.   Regarding the episode of vomiting she has had this multiple times throughout the years unclear cause she has had gallbladder removed she is been treated for reflux normal abdominal exam today.
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
## 2018-12-10 DIAGNOSIS — E890 Postprocedural hypothyroidism: Secondary | ICD-10-CM | POA: Diagnosis not present

## 2018-12-10 LAB — T4, FREE: Free T4: 1.5 ng/dL (ref 0.8–1.8)

## 2018-12-10 LAB — TSH: TSH: 0.81 mIU/L (ref 0.40–4.50)

## 2018-12-16 ENCOUNTER — Ambulatory Visit (INDEPENDENT_AMBULATORY_CARE_PROVIDER_SITE_OTHER): Payer: BC Managed Care – PPO | Admitting: "Endocrinology

## 2018-12-16 ENCOUNTER — Other Ambulatory Visit: Payer: Self-pay

## 2018-12-16 ENCOUNTER — Encounter: Payer: Self-pay | Admitting: "Endocrinology

## 2018-12-16 DIAGNOSIS — E890 Postprocedural hypothyroidism: Secondary | ICD-10-CM

## 2018-12-16 DIAGNOSIS — G4733 Obstructive sleep apnea (adult) (pediatric): Secondary | ICD-10-CM | POA: Diagnosis not present

## 2018-12-16 MED ORDER — TIROSINT 137 MCG PO CAPS: ORAL_CAPSULE | ORAL | 1 refills | Status: DC
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
## 2018-12-30 ENCOUNTER — Ambulatory Visit: Payer: Self-pay | Admitting: Neurology

## 2019-01-01 ENCOUNTER — Ambulatory Visit: Payer: BLUE CROSS/BLUE SHIELD | Admitting: "Endocrinology

## 2019-01-12 ENCOUNTER — Ambulatory Visit: Payer: BC Managed Care – PPO | Admitting: Family Medicine

## 2019-01-15 DIAGNOSIS — G4733 Obstructive sleep apnea (adult) (pediatric): Secondary | ICD-10-CM | POA: Diagnosis not present

## 2019-01-21 ENCOUNTER — Ambulatory Visit: Payer: BC Managed Care – PPO | Admitting: Family Medicine

## 2019-01-21 ENCOUNTER — Other Ambulatory Visit: Payer: Self-pay

## 2019-01-21 ENCOUNTER — Encounter: Payer: Self-pay | Admitting: Family Medicine

## 2019-01-21 VITALS — BP 124/66 | HR 60 | Temp 98.4°F | Resp 14 | Ht 66.0 in | Wt 232.0 lb

## 2019-01-21 DIAGNOSIS — I10 Essential (primary) hypertension: Secondary | ICD-10-CM | POA: Diagnosis not present

## 2019-01-21 DIAGNOSIS — Z566 Other physical and mental strain related to work: Secondary | ICD-10-CM | POA: Diagnosis not present

## 2019-01-21 DIAGNOSIS — F411 Generalized anxiety disorder: Secondary | ICD-10-CM

## 2019-01-21 DIAGNOSIS — F900 Attention-deficit hyperactivity disorder, predominantly inattentive type: Secondary | ICD-10-CM | POA: Diagnosis not present

## 2019-01-21 DIAGNOSIS — F3342 Major depressive disorder, recurrent, in full remission: Secondary | ICD-10-CM | POA: Diagnosis not present

## 2019-01-21 MED ORDER — LISINOPRIL 10 MG PO TABS: 10.0000 mg | ORAL_TABLET | Freq: Every day | ORAL | 3 refills | Status: DC

## 2019-01-21 MED ORDER — OMEPRAZOLE 40 MG PO CPDR: 40.0000 mg | DELAYED_RELEASE_CAPSULE | Freq: Every day | ORAL | 3 refills | Status: DC

## 2019-01-21 MED ORDER — ALPRAZOLAM 0.5 MG PO TABS: ORAL_TABLET | ORAL | 1 refills | Status: DC

## 2019-01-21 MED ORDER — DICLOFENAC SODIUM 1 % TD GEL: TRANSDERMAL | 2 refills | Status: DC

## 2019-01-21 MED ORDER — BUPROPION HCL ER (XL) 300 MG PO TB24: 300.0000 mg | ORAL_TABLET | Freq: Every day | ORAL | 6 refills | Status: DC

## 2019-01-21 MED ORDER — AMPHETAMINE-DEXTROAMPHET ER 20 MG PO CP24: 20.0000 mg | ORAL_CAPSULE | Freq: Every day | ORAL | 0 refills | Status: DC

## 2019-01-21 NOTE — Patient Instructions (Signed)
F/U Feb for Physical  

## 2019-01-21 NOTE — Assessment & Plan Note (Signed)
Continue with Adderall now that she is back at work.  She does not take on the weekends or on holidays.  This helps her focus.

## 2019-01-21 NOTE — Progress Notes (Signed)
Subjective:    Patient ID: Crystal Waters, female    DOB: May 16, 1955, 63 y.o.   MRN: [id number]  Patient presents for Follow-up (is not fasting) Patient here for interim follow-up on her depression anxiety insomnia.  At her last visit she was released back to work.  Below is my recommendations from that visit:  "Major depressive disorder along with generalized anxiety and insomnia.  This is been worsened by her work environment.  Unfortunately there is something we can do particular about the gentleman that is still working and she has not heard anything from her higher up.  She has been following with her psychotherapist which she does feel has been beneficial.  I will add alprazolam for her to use in the evening time to help with anxiety and her sleep.  She will continue her Wellbutrin at 300 mg.  At this point organ to release her back to work on October 5 and she will see how she does when she goes back.  I will see her about 3 to 4 weeks after she returns to work."   Since our last visit, she has returned to work - has been back a month now. She is working with better enviroment though  Uses xanax as needed, if she wakes up in middle of night will take to help her sleep Next appt on Nov 11th for therapist   She has appt for her OSA/CPAP Therapy Nov 11th, getting some benefit from CPAP  She is followed by endocrinology for her hypothyroidism, had visit in Sept   HTN- taking lisinopril 10mg  daily    ADHD- she uses Adderall now back work helps her focus.  He does need a refill on this today.  GI discomfort has improved, still has IBS like symptoms. Taking omeprazole    Review Of Systems:  GEN- denies fatigue, fever, weight loss,weakness, recent illness HEENT- denies eye drainage, change in vision, nasal discharge, CVS- denies chest pain, palpitations RESP- denies SOB, cough, wheeze ABD- denies N/V, change in stools, abd pain GU- denies dysuria, hematuria, dribbling,  incontinence MSK- denies joint pain, muscle aches, injury Neuro- denies headache, dizziness, syncope, seizure activity       Objective:    BP 124/66   Pulse 60   Temp 98.4 F (36.9 C) (Temporal)   Resp 14   Ht 5\' 6"  (1.676 m)   Wt 232 lb (105.2 kg)   SpO2 96%   BMI 37.45 kg/m  GEN- NAD, alert and oriented x3 HEENT- PERRL, EOMI, non injected sclera, pink conjunctiva, MMM, oropharynx clear CVS- RRR, no murmur RESP-CTAB ABD-NABS,soft,NT,ND Psych- normal affect and mood, well groomed, good eye contact  EXT- No edema Pulses- Radial  2+        Assessment & Plan:      Problem List Items Addressed This Visit      Unprioritized   ADD (attention deficit disorder)    Continue with Adderall now that she is back at work.  She does not take on the weekends or on holidays.  This helps her focus.      Essential hypertension, benign - Primary    Controlled no changes       Relevant Medications   lisinopril (ZESTRIL) 10 MG tablet   GAD (generalized anxiety disorder)   Relevant Medications   buPROPion (WELLBUTRIN XL) 300 MG 24 hr tablet   ALPRAZolam (XANAX) 0.5 MG tablet   Major depressive disorder    She is adjusting now that she is back  at work.  I do think it is been beneficial to her that she is at a different branch from the previous supervisor.  She will continue follow-up with her psychotherapist she has an appointment in another week or so.  No change to the Wellbutrin or her benzodiazepine, overall I do think that she is doing much better.      Relevant Medications   buPROPion (WELLBUTRIN XL) 300 MG 24 hr tablet   ALPRAZolam (XANAX) 0.5 MG tablet   Stress at work      Note: This dictation was prepared with Sales executive along with smaller Company secretary. Any transcriptional errors that result from this process are unintentional.

## 2019-01-21 NOTE — Assessment & Plan Note (Signed)
Controlled no changes 

## 2019-01-21 NOTE — Assessment & Plan Note (Addendum)
She is adjusting now that she is back at work.  I do think it is been beneficial to her that she is at a different branch from the previous supervisor.  She will continue follow-up with her psychotherapist she has an appointment in another week or so.  No change to the Wellbutrin or her benzodiazepine, overall I do think that she is doing much better.

## 2019-01-26 ENCOUNTER — Encounter: Payer: Self-pay | Admitting: Neurology

## 2019-01-27 DIAGNOSIS — G4733 Obstructive sleep apnea (adult) (pediatric): Secondary | ICD-10-CM | POA: Diagnosis not present

## 2019-01-28 ENCOUNTER — Other Ambulatory Visit: Payer: Self-pay | Admitting: Family Medicine

## 2019-01-28 ENCOUNTER — Encounter: Payer: Self-pay | Admitting: Neurology

## 2019-01-28 ENCOUNTER — Other Ambulatory Visit: Payer: Self-pay

## 2019-01-28 ENCOUNTER — Ambulatory Visit: Payer: BC Managed Care – PPO | Admitting: Neurology

## 2019-01-28 VITALS — BP 120/73 | HR 79 | Temp 97.7°F | Ht 67.0 in | Wt 233.0 lb

## 2019-01-28 DIAGNOSIS — Z9989 Dependence on other enabling machines and devices: Secondary | ICD-10-CM

## 2019-01-28 DIAGNOSIS — G4733 Obstructive sleep apnea (adult) (pediatric): Secondary | ICD-10-CM

## 2019-01-28 NOTE — Progress Notes (Signed)
Subjective:    Patient ID: Crystal Waters is a 63 y.o. female.  HPI     Interim history:   Crystal Waters is a 63 year old right-handed woman with an underlying medical history of thyroid cancer, PTSD, hypothyroidism, hypertension, kidney stones, reflux disease, depression, anxiety, arthritis, asthma, and obesity, who presents for follow-up consultation of her obstructive sleep apnea, after interim home sleep testing and starting AutoPap therapy.  The patient is unaccompanied today.  I first met her in a virtual visit on 09/03/2018 at the request of her primary care physician, at which time the patient reported oxygen desaturations while asleep and snoring.  She was advised to proceed with sleep study testing.  She had a home sleep test on 10/06/2018 which indicated mild/near moderate obstructive sleep apnea with an AHI of 14.3/h, O2 nadir of 88%.  She was advised to proceed with treatment in the form of AutoPap therapy.  Her set up date was 10/15/2018  Today, 01/28/2019: I reviewed her AutoPap compliance data from 12/28/2018 through 01/26/2019 which is a total of 30 days, during which time she used her AutoPap every night with percent use days greater than 4 hours at 100%, indicating superb compliance with an average usage of 7 hours and 1 minutes, residual AHI at goal at 2.4/h, average 95th percentile of pressure at 7.8 cm with a range of 5 cm to 11 cm with EPR, leak on the low side with a 95th percentile at 3.8 L/min.  She reports feeling better with her AutoPap. In particular, she is not as tired during the day, better energy level, sleep is more nonrestorative and she even has had dreams. She continues to struggle with bilateral knee pain, she sees Dr. Ricki Rodriguez for this.  She eventually will need knee replacement surgeries. She still has middle of the night awakenings.  However, she can often go back to sleep.  Sometimes she takes Xanax at night. She uses it cautiously and infrequently.  The  patient's allergies, current medications, family history, past medical history, past social history, past surgical history and problem list were reviewed and updated as appropriate.   Previously:   09/03/2018: (She) presents for a virtual, video based appointment via epic MyChart video visit for evaluation of her sleep disorder, in particular, concern for underlying obstructive sleep apnea.  The patient is unaccompanied today and joins via tablet from home, I am located in my office.  She is referred by her primary care physician, Dr. Buelah Manis, and I reviewed her virtual visit note from 07/25/2018. Her Epworth sleepiness score is 3 out of 24, fatigue severity score is 54 out of 63. She is on Adderall for ADD.  She drinks caffeine and limitation, 2 cups of coffee per day typically.  She does not typically drink soda or tea. She recently did an at home pulse oximetry test through a device that her brother-in-law had and there were several drops of oxygen saturation into the 80s, as low as 80, at which point the device started vibrating to wake her up.  She reports that her older son has sleep apnea and uses a CPAP machine faithfully, he lives in [place].  Her younger son lives with her, he has autism.  She has a small dog in the household, she does have a TV on at night in her bedroom, sometimes puts it on a timer but typically has it on through the night.  She has a longstanding history of difficulty maintaining sleep.  She does fall asleep  fairly well.  She has tried over-the-counter and prescription medication for sleep.  She does not recall all the names of the prescription sleep meds.  She has tried medication for anxiety as well.  She did try Ambien but it did not help that much.  She has tried over-the-counter melatonin.  She does snore.  She has occasionally woken up with a sense of gasping for air.  She does not recall dreaming.  She does not wake up rested.  She does not remember the last time she actually  felt good after sleeping.  Bedtime is generally between 930 and 10 but she is not asleep until 11 or 1130 on most nights.  Rise time is variable.  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    Her Past Medical History Is Significant For: Past Medical History:  Diagnosis Date  . Allergy    gluten, seasonal  . Anxiety   . Arthritis   . Asthma    seasonal   . Cancer ([place])    Thyroid  . Complication of anesthesia   . Constipation   . Depression   . GERD (gastroesophageal reflux disease)   . Gestational diabetes mellitus    with 1 child.    . H/O nose injury    accidently hit in the nose, has a stuffy nose, can't lie flat, wears nasal strips at night  . History of kidney stones      x 2 passed  . Hypertension   . Hypothyroidism   . PONV (postoperative nausea and vomiting)   . PTSD (post-traumatic stress disorder)   . Thyroid cancer ([place])   . Thyroid disease     Her Past Surgical History Is Significant For: Past Surgical History:  Procedure Laterality Date  . CHOLECYSTECTOMY N/A 04/11/2016   Procedure: LAPAROSCOPIC CHOLECYSTECTOMY WITH INTRAOPERATIVE CHOLANGIOGRAM;  Surgeon: Georganna Skeans, MD;  Location: [place];  Service: General;  Laterality: N/A;  . COLONOSCOPY    . KNEE SURGERY Right 2009  . LAPAROTOMY N/A 04/17/2016   Procedure: LAPAROTOMY REPAIR OF INCARCERATED INCISIONAL HERNIA WITH VAC PLACEMENT;  Surgeon: Rolm Bookbinder, MD;  Location: [place];  Service: General;  Laterality: N/A;  . THYROID SURGERY     2001  . TUBAL LIGATION      Her Family History Is Significant For: Family History  Problem Relation Age of Onset  . Heart disease Mother   . Diabetes Father   . Heart disease Father   . Hyperlipidemia Father   . Hypertension Father   . Diabetes Brother   . Autism Son   . Lupus Maternal Grandmother   . Heart attack Maternal Grandfather   . Cancer Paternal Grandmother   . Kidney Stones Sister   . Colon cancer Neg Hx   . Rectal cancer Neg Hx   . Esophageal  cancer Neg Hx   . Liver cancer Neg Hx   . Stomach cancer Neg Hx     Her Social History Is Significant For: Social History   Socioeconomic History  . Marital status: Divorced    Spouse name: Not on file  . Number of children: 2  . Years of education: Not on file  . Highest education level: Not on file  Occupational History  . Occupation: Field seismologist  Social Needs  . Financial resource strain: Not on file  . Food insecurity    Worry: Not on file    Inability: Not on file  . Transportation needs    Medical:  Not on file    Non-medical: Not on file  Tobacco Use  . Smoking status: Former Research scientist (life sciences)  . Smokeless tobacco: Never Used  . Tobacco comment: social smoker- never a habit  Substance and Sexual Activity  . Alcohol use: Yes    Comment: occasionally   . Drug use: No  . Sexual activity: Not Currently  Lifestyle  . Physical activity    Days per week: Not on file    Minutes per session: Not on file  . Stress: Not on file  Relationships  . Social Herbalist on phone: Not on file    Gets together: Not on file    Attends religious service: Not on file    Active member of club or organization: Not on file    Attends meetings of clubs or organizations: Not on file    Relationship status: Not on file  Other Topics Concern  . Not on file  Social History Narrative  . Not on file    Her Allergies Are:  Allergies  Allergen Reactions  . Gluten Meal Rash  . Zoloft [Sertraline Hcl] Other (See Comments)    STOMACH ACHES  :   Her Current Medications Are:  Outpatient Encounter Medications as of 01/28/2019  Medication Sig  . acetaminophen (TYLENOL) 500 MG tablet Take 500 mg every 8 (eight) hours as needed by mouth.  . ALPRAZolam (XANAX) 0.5 MG tablet Take 1 tab BID prn anxiety  . amphetamine-dextroamphetamine (ADDERALL XR) 20 MG 24 hr capsule Take 1 capsule (20 mg total) by mouth daily.  Marland Kitchen buPROPion (WELLBUTRIN XL) 300 MG 24 hr tablet Take 1 tablet (300 mg total) by  mouth daily.  . Cholecalciferol (VITAMIN D) 2000 UNITS tablet Take 2,000 Units by mouth daily.  . clotrimazole-betamethasone (LOTRISONE) cream Apply 1 application topically 2 (two) times daily as needed.  . diclofenac sodium (VOLTAREN) 1 % GEL PA [id number] approved 03/25/2018- 04/24/2019.  . ibuprofen (ADVIL,MOTRIN) 200 MG tablet Take 400 mg by mouth every 8 (eight) hours as needed for mild pain or moderate pain.  . Levothyroxine Sodium (TIROSINT) 137 MCG CAPS TAKE 1 CAPSULE (137 MCG TOTAL) BY MOUTH DAILY BEFORE BREAKFAST.  Marland Kitchen lisinopril (ZESTRIL) 10 MG tablet Take 1 tablet (10 mg total) by mouth daily.  Marland Kitchen omeprazole (PRILOSEC) 40 MG capsule Take 1 capsule (40 mg total) by mouth daily.  . ondansetron (ZOFRAN ODT) 4 MG disintegrating tablet Take 1 tablet (4 mg total) by mouth every 8 (eight) hours as needed for nausea or vomiting.  Marland Kitchen OVER THE COUNTER MEDICATION Nasal spay qhs  . Probiotic Product (ALIGN) 4 MG CAPS Take 1 capsule by mouth daily.   No facility-administered encounter medications on file as of 01/28/2019.   :  Review of Systems:  Out of a complete 14 point review of systems, all are reviewed and negative with the exception of these symptoms as listed below:  Review of Systems  Neurological:       Pt presents today to discuss her auto pap. Pt reports that she does feel better on auto pap therapy.    Objective:  Neurological Exam  Physical Exam Physical Examination:   Vitals:   01/28/19 1015  BP: 120/73  Pulse: 79  Temp: 97.7 F (36.5 C)   General Examination: The patient is a very pleasant 63 y.o. female in no acute distress. She appears well-developed and well-nourished and well groomed.   HEENT: Normocephalic, atraumatic, pupils are equal, round and reactive to light, Extraocular  tracking is well preserved, corrective eyeglasses in place.  Face is symmetric with normal facial animation.  She has no carotid bruits.  Airway examination reveals moderate mouth dryness,  otherwise crowding, tongue protrudes centrally in palate elevates symmetrically.  Chest: Clear to auscultation without wheezing, rhonchi or crackles noted.  Heart: S1+S2+0, regular and normal without murmurs, rubs or gallops noted.   Abdomen: Soft, non-tender and non-distended with normal bowel sounds appreciated on auscultation.  Extremities: There is no pitting edema in the distal lower extremities bilaterally.  Skin: Warm and dry without trophic changes noted.  Musculoskeletal: exam reveals b/l knee pain.   Neurologically:  Mental status: The patient is awake, alert and oriented in all 4 spheres. Her immediate and remote memory, attention, language skills and fund of knowledge are appropriate. There is no evidence of aphasia, agnosia, apraxia or anomia. Speech is clear with normal prosody and enunciation. Thought process is linear. Mood is normal and affect is normal.  Cranial nerves II - XII are as described above under HEENT exam.  Motor exam: Normal bulk, strength and tone is noted. There is no tremor.  Fine motor skills are grossly intact.  Sensory exam is intact to light touch.  She walks without a walking aid.  Assessment and Plan:   In summary, Crystal Waters is a very pleasant 63 y.o.-year old female with an underlying medical history of thyroid cancer, PTSD, hypothyroidism, hypertension, kidney stones, reflux disease, depression, anxiety, arthritis, asthma, and obesity, who Presents for follow-up consultation of her obstructive sleep apnea.  She had a home sleep test in July 2020 which indicated an AHI of 14.3/h, O2 nadir of 88%.She has established treatment at home on AutoPap therapy.  She is fully compliant with it and commended for it.  She indicates good results including better sleep consolidation and sleep quality, less daytime somnolence.  She is advised to continue to be fully compliant with her AutoPap.  She has benefited from it and is encouraged to continue to work on  weight loss.  She is of course limited in her mobility secondary to bilateral knee pain.  She may eventually need bilateral knee replacements as I understand.We talked about her home sleep test results as well as her compliance data today.  She is using nasal pillows successfully and is up-to-date with her supplies.  She is advised to follow-up routinely in 6 months with a nurse practitioner and hopefully yearly thereafter.  I answered all her questions today and she was in agreement.

## 2019-01-28 NOTE — Patient Instructions (Signed)
Please continue using your autoPAP regularly. While your insurance requires that you use PAP at least 4 hours each night on 70% of the nights, I recommend, that you not skip any nights and use it throughout the night if you can. Getting used to PAP and staying with the treatment long term does take time and patience and discipline. Untreated obstructive sleep apnea when it is moderate to severe can have an adverse impact on cardiovascular health and raise her risk for heart disease, arrhythmias, hypertension, congestive heart failure, stroke and diabetes. Untreated obstructive sleep apnea causes sleep disruption, nonrestorative sleep, and sleep deprivation. This can have an impact on your day to day functioning and cause daytime sleepiness and impairment of cognitive function, memory loss, mood disturbance, and problems focussing. Using PAP regularly can improve these symptoms.  Keep up the good work! Please follow-up in 6 months with a nurse practitioner, hopefully, if you continue to do well we can see you yearly afterwards.  Please continue to work on weight loss.

## 2019-01-29 NOTE — Telephone Encounter (Signed)
Last office visit: 01/21/2019 Last refill: 01/21/2019

## 2019-02-23 ENCOUNTER — Encounter: Payer: Self-pay | Admitting: Family Medicine

## 2019-03-20 HISTORY — PX: REPLACEMENT TOTAL KNEE: SUR1224

## 2019-03-23 ENCOUNTER — Other Ambulatory Visit: Payer: Self-pay

## 2019-03-31 DIAGNOSIS — M1711 Unilateral primary osteoarthritis, right knee: Secondary | ICD-10-CM | POA: Diagnosis not present

## 2019-03-31 DIAGNOSIS — M25661 Stiffness of right knee, not elsewhere classified: Secondary | ICD-10-CM | POA: Diagnosis not present

## 2019-03-31 DIAGNOSIS — M25561 Pain in right knee: Secondary | ICD-10-CM | POA: Diagnosis not present

## 2019-04-14 DIAGNOSIS — Z7689 Persons encountering health services in other specified circumstances: Secondary | ICD-10-CM | POA: Diagnosis not present

## 2019-04-14 DIAGNOSIS — M25561 Pain in right knee: Secondary | ICD-10-CM | POA: Diagnosis not present

## 2019-04-15 ENCOUNTER — Telehealth: Payer: Self-pay | Admitting: *Deleted

## 2019-04-15 NOTE — Telephone Encounter (Signed)
Received request from pharmacy for PA on Diclofenac Gel.   PA submitted.   Dx: M17.0- OA  Received immediate determination.   PA [id number] approved 03/16/2019- 04/14/2020.

## 2019-04-21 DIAGNOSIS — M1711 Unilateral primary osteoarthritis, right knee: Secondary | ICD-10-CM | POA: Diagnosis not present

## 2019-05-11 ENCOUNTER — Encounter: Payer: Self-pay | Admitting: Family Medicine

## 2019-05-11 ENCOUNTER — Other Ambulatory Visit: Payer: Self-pay

## 2019-05-11 ENCOUNTER — Ambulatory Visit (INDEPENDENT_AMBULATORY_CARE_PROVIDER_SITE_OTHER): Payer: BC Managed Care – PPO | Admitting: Family Medicine

## 2019-05-11 VITALS — BP 122/64 | HR 96 | Temp 98.1°F | Resp 16 | Ht 67.0 in | Wt 229.0 lb

## 2019-05-11 DIAGNOSIS — F411 Generalized anxiety disorder: Secondary | ICD-10-CM

## 2019-05-11 DIAGNOSIS — Z1231 Encounter for screening mammogram for malignant neoplasm of breast: Secondary | ICD-10-CM

## 2019-05-11 DIAGNOSIS — E66812 Obesity, class 2: Secondary | ICD-10-CM

## 2019-05-11 DIAGNOSIS — E038 Other specified hypothyroidism: Secondary | ICD-10-CM | POA: Diagnosis not present

## 2019-05-11 DIAGNOSIS — G4733 Obstructive sleep apnea (adult) (pediatric): Secondary | ICD-10-CM | POA: Diagnosis not present

## 2019-05-11 DIAGNOSIS — Z0000 Encounter for general adult medical examination without abnormal findings: Secondary | ICD-10-CM | POA: Diagnosis not present

## 2019-05-11 DIAGNOSIS — F3342 Major depressive disorder, recurrent, in full remission: Secondary | ICD-10-CM

## 2019-05-11 DIAGNOSIS — I10 Essential (primary) hypertension: Secondary | ICD-10-CM

## 2019-05-11 DIAGNOSIS — Z6835 Body mass index (BMI) 35.0-35.9, adult: Secondary | ICD-10-CM

## 2019-05-11 NOTE — Assessment & Plan Note (Signed)
Overall she is doing well on her current medication regimen.  She also continues to follow with her psychotherapist which is been beneficial to her.

## 2019-05-11 NOTE — Patient Instructions (Signed)
F/U 6 months  Schedule mammogram Think about colon cancer screening- Cologuard vs Colonoscopy  We will call with lab results

## 2019-05-11 NOTE — Assessment & Plan Note (Signed)
Blood pressures well controlled no change in medication.

## 2019-05-11 NOTE — Progress Notes (Signed)
Subjective:    Patient ID: Crystal Waters, female    DOB: 1955-12-29, 64 y.o.   MRN: [id number]  Patient presents for Annual Exam (is fasting)   Pt here to for CPE   She had right knee replacement 3 weeks ago. Currently on ASA for DVT protection. Oxycodone as needed for pain. She has weaned off gabapentin, robaxin. Dr. Ricki Rodriguez PA Laure[facility]   HTN- taking lisinopril as needed  GAD/MDD- doing well on wellbutrin, xanax , she is not working currently her psychotherapist and I have visit from the past 2 months from December to her last 1 being February 16 from [facility] will be scanned into her chart.  In general she feels less stressed and she has been at home not working at this time.  Hypothyroidism- taking levothyroxine 159mcg once a day  Per endocrinology  She is not taking the adderall right now since she is not working  Mammogram Due- last in  2019  Colon cancer screening- done in 2011 Immunizations- TDAP/FLU UTD, planning for COVID-19 vaccine , decline shingles at this time Pap smear up-to-date.   FOllows with dentist/ eye doctor   Review Of Systems:  GEN- denies fatigue, fever, weight loss,weakness, recent illness HEENT- denies eye drainage, change in vision, nasal discharge, CVS- denies chest pain, palpitations RESP- denies SOB, cough, wheeze ABD- denies N/V, change in stools, abd pain GU- denies dysuria, hematuria, dribbling, incontinence MSK- denies joint pain, muscle aches, injury Neuro- denies headache, dizziness, syncope, seizure activity       Objective:    BP 122/64   Pulse 96   Temp 98.1 F (36.7 C) (Temporal)   Resp 16   Ht 5\' 7"  (1.702 m)   Wt 229 lb (103.9 kg)   SpO2 96%   BMI 35.87 kg/m  GEN- NAD, alert and oriented x3 HEENT- PERRL, EOMI, non injected sclera, pink conjunctiva, MMM, oropharynx clear Neck- Supple, no thyromegaly CVS- RRR, no murmur RESP-CTAB ABD-NABS,soft,NT,ND EXT- No edema Pulses- Radial, DP- 2+         Assessment & Plan:      Problem List Items Addressed This Visit      Unprioritized   Essential hypertension, benign    Blood pressures well controlled no change in medication.      Relevant Orders   CBC with Differential/Platelet   Comprehensive metabolic panel   Lipid panel   GAD (generalized anxiety disorder)   Hypothyroidism    Thyroid labs to be drawn for endocrinology will defer to them for management.      Relevant Orders   T4, free   TSH   Major depressive disorder    Overall she is doing well on her current medication regimen.  She also continues to follow with her psychotherapist which is been beneficial to her.      Obesity    Other Visit Diagnoses    Routine general medical examination at a health care facility    -  Primary   CPE done,. pt think about colon cancer screening and which she prefers, we discussed both options, will schedule mammo   Relevant Orders   CBC with Differential/Platelet   Comprehensive metabolic panel   Lipid panel   Encounter for screening mammogram for malignant neoplasm of breast       Relevant Orders   MM 3D SCREEN BREAST BILATERAL      Note: This dictation was prepared with Dragon dictation along with smaller phrase technology. Any transcriptional errors that result from  this process are unintentional.

## 2019-05-11 NOTE — Assessment & Plan Note (Signed)
Thyroid labs to be drawn for endocrinology will defer to them for management.

## 2019-05-12 ENCOUNTER — Other Ambulatory Visit: Payer: Self-pay | Admitting: *Deleted

## 2019-05-12 DIAGNOSIS — E7800 Pure hypercholesterolemia, unspecified: Secondary | ICD-10-CM

## 2019-05-12 LAB — LIPID PANEL
Cholesterol: 257 mg/dL — ABNORMAL HIGH (ref <200)
HDL: 73 mg/dL (ref >=50)
LDL Cholesterol (Calc): 154 mg/dL (calc) — ABNORMAL HIGH
Non-HDL Cholesterol (Calc): 184 mg/dL (calc) — ABNORMAL HIGH (ref <130)
Total CHOL/HDL Ratio: 3.5 (calc) (ref <5.0)
Triglycerides: 167 mg/dL — ABNORMAL HIGH (ref <150)

## 2019-05-12 LAB — CBC WITH DIFFERENTIAL/PLATELET
Absolute Monocytes: 567 cells/uL (ref 200–950)
Basophils Absolute: 57 cells/uL (ref 0–200)
Basophils Relative: 0.9 %
Eosinophils Absolute: 88 cells/uL (ref 15–500)
Eosinophils Relative: 1.4 %
HCT: 41.1 % (ref 35.0–45.0)
Hemoglobin: 13.4 g/dL (ref 11.7–15.5)
Lymphs Abs: 1430 cells/uL (ref 850–3900)
MCH: 29.6 pg (ref 27.0–33.0)
MCHC: 32.6 g/dL (ref 32.0–36.0)
MCV: 90.9 fL (ref 80.0–100.0)
MPV: 10.3 fL (ref 7.5–12.5)
Monocytes Relative: 9 %
Neutro Abs: 4158 cells/uL (ref 1500–7800)
Neutrophils Relative %: 66 %
Platelets: 339 10*3/uL (ref 140–400)
RBC: 4.52 10*6/uL (ref 3.80–5.10)
RDW: 12.7 % (ref 11.0–15.0)
Total Lymphocyte: 22.7 %
WBC: 6.3 10*3/uL (ref 3.8–10.8)

## 2019-05-12 LAB — COMPREHENSIVE METABOLIC PANEL
AG Ratio: 1.5 (calc) (ref 1.0–2.5)
ALT: 11 U/L (ref 6–29)
AST: 13 U/L (ref 10–35)
Albumin: 4.1 g/dL (ref 3.6–5.1)
Alkaline phosphatase (APISO): 88 U/L (ref 37–153)
BUN/Creatinine Ratio: 18 (calc) (ref 6–22)
BUN: 19 mg/dL (ref 7–25)
CO2: 28 mmol/L (ref 20–32)
Calcium: 9.9 mg/dL (ref 8.6–10.4)
Chloride: 104 mmol/L (ref 98–110)
Creat: 1.08 mg/dL — ABNORMAL HIGH (ref 0.50–0.99)
Globulin: 2.8 g/dL (calc) (ref 1.9–3.7)
Glucose, Bld: 106 mg/dL — ABNORMAL HIGH (ref 65–99)
Potassium: 4.9 mmol/L (ref 3.5–5.3)
Sodium: 141 mmol/L (ref 135–146)
Total Bilirubin: 0.4 mg/dL (ref 0.2–1.2)
Total Protein: 6.9 g/dL (ref 6.1–8.1)

## 2019-05-12 LAB — T4, FREE: Free T4: 1.3 ng/dL (ref 0.8–1.8)

## 2019-05-12 LAB — TSH: TSH: 5.74 mIU/L — ABNORMAL HIGH (ref 0.40–4.50)

## 2019-05-21 ENCOUNTER — Encounter: Payer: Self-pay | Admitting: Adult Health

## 2019-05-26 DIAGNOSIS — Z96651 Presence of right artificial knee joint: Secondary | ICD-10-CM | POA: Diagnosis not present

## 2019-05-26 DIAGNOSIS — Z471 Aftercare following joint replacement surgery: Secondary | ICD-10-CM | POA: Diagnosis not present

## 2019-06-09 ENCOUNTER — Other Ambulatory Visit: Payer: Self-pay | Admitting: Family Medicine

## 2019-06-16 ENCOUNTER — Ambulatory Visit (INDEPENDENT_AMBULATORY_CARE_PROVIDER_SITE_OTHER): Payer: BC Managed Care – PPO | Admitting: "Endocrinology

## 2019-06-16 ENCOUNTER — Encounter: Payer: Self-pay | Admitting: "Endocrinology

## 2019-06-16 DIAGNOSIS — E890 Postprocedural hypothyroidism: Secondary | ICD-10-CM

## 2019-06-16 MED ORDER — LEVOTHYROXINE SODIUM 150 MCG PO CAPS: 150.0000 ug | ORAL_CAPSULE | Freq: Every day | ORAL | 1 refills | Status: DC

## 2019-06-16 NOTE — Progress Notes (Signed)
06/16/2019, 9:26 PM                                     Endocrinology Telehealth Visit Follow up Note -During COVID -19 Pandemic  I connected with Crystal Waters on 06/16/2019   by telephone and verified that I am speaking with the correct person using two identifiers. Crystal Waters, June 27, 1955. she has verbally consented to this visit. All issues noted in this document were discussed and addressed. The format was not optimal for physical exam.   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 PMD:  Alycia Rossetti, MD.   Past Medical History:  Diagnosis Date  . Allergy    gluten, seasonal  . Anxiety   . Arthritis   . Asthma    seasonal   . Cancer ([place])    Thyroid  . Complication of anesthesia   . Constipation   . Depression   . GERD (gastroesophageal reflux disease)   . Gestational diabetes mellitus    with 1 child.    . H/O nose injury    accidently hit in the nose, has a stuffy nose, can't lie flat, wears nasal strips at night  . History of kidney stones      x 2 passed  . Hypertension   . Hypothyroidism   . PONV (postoperative nausea and vomiting)   . PTSD (post-traumatic stress disorder)   . Thyroid cancer ([place])   . Thyroid disease    Past Surgical History:  Procedure Laterality Date  . CHOLECYSTECTOMY N/A 04/11/2016   Procedure: LAPAROSCOPIC CHOLECYSTECTOMY WITH INTRAOPERATIVE CHOLANGIOGRAM;  Surgeon: Georganna Skeans, MD;  Location: [place];  Service: General;  Laterality: N/A;  . COLONOSCOPY    . KNEE SURGERY Right 2009  . LAPAROTOMY N/A 04/17/2016   Procedure: LAPAROTOMY REPAIR OF INCARCERATED INCISIONAL HERNIA WITH VAC PLACEMENT;  Surgeon: Rolm Bookbinder, MD;  Location: [place];  Service: General;  Laterality: N/A;  . THYROID SURGERY     2001  . TUBAL LIGATION     Social History    Socioeconomic History  . Marital status: Divorced    Spouse name: Not on file  . Number of children: 2  . Years of education: Not on file  . Highest education level: Not on file  Occupational History  . Occupation: lead teller  Tobacco Use  . Smoking status: Former Research scientist (life sciences)  . Smokeless tobacco: Never Used  . Tobacco comment: social smoker- never a habit  Substance and Sexual Activity  . Alcohol use: Yes    Comment: occasionally   . Drug use: No  . Sexual activity: Not Currently  Other Topics Concern  . Not on file  Social History Narrative  . Not on file   Social Determinants of Health   Financial Resource Strain:   . Difficulty of Paying Living Expenses:   Food Insecurity:   . Worried About Charity fundraiser  in the Last Year:   . [place] in the Last Year:   Transportation Needs:   . Film/video editor (Medical):   Marland Kitchen Lack of Transportation (Non-Medical):   Physical Activity:   . Days of Exercise per Week:   . Minutes of Exercise per Session:   Stress:   . Feeling of Stress :   Social Connections:   . Frequency of Communication with Friends and Family:   . Frequency of Social Gatherings with Friends and Family:   . Attends Religious Services:   . Active Member of Clubs or Organizations:   . Attends Archivist Meetings:   Marland Kitchen Marital Status:    Outpatient Encounter Medications as of 06/16/2019  Medication Sig  . acetaminophen (TYLENOL) 500 MG tablet Take 500 mg every 8 (eight) hours as needed by mouth.  . ALPRAZolam (XANAX) 0.5 MG tablet Take 1 tab BID prn anxiety (Patient not taking: Reported on 05/11/2019)  . amphetamine-dextroamphetamine (ADDERALL XR) 20 MG 24 hr capsule Take 1 capsule (20 mg total) by mouth daily. (Patient not taking: Reported on 05/11/2019)  . buPROPion (WELLBUTRIN XL) 300 MG 24 hr tablet TAKE 1 TABLET BY MOUTH EVERY DAY  . Cholecalciferol (VITAMIN D) 2000 UNITS tablet Take 2,000 Units by mouth daily.  .  clotrimazole-betamethasone (LOTRISONE) cream Apply 1 application topically 2 (two) times daily as needed.  . diclofenac sodium (VOLTAREN) 1 % GEL PA [id number] approved 03/25/2018- 04/24/2019. (Patient not taking: Reported on 05/11/2019)  . ibuprofen (ADVIL,MOTRIN) 200 MG tablet Take 400 mg by mouth every 8 (eight) hours as needed for mild pain or moderate pain.  . Levothyroxine Sodium (TIROSINT) 150 MCG CAPS Take 1 capsule (150 mcg total) by mouth daily before breakfast.  . lisinopril (ZESTRIL) 10 MG tablet Take 1 tablet (10 mg total) by mouth daily.  Marland Kitchen omeprazole (PRILOSEC) 40 MG capsule Take 1 capsule (40 mg total) by mouth daily.  . ondansetron (ZOFRAN ODT) 4 MG disintegrating tablet Take 1 tablet (4 mg total) by mouth every 8 (eight) hours as needed for nausea or vomiting.  Marland Kitchen OVER THE COUNTER MEDICATION Nasal spay qhs  . Probiotic Product (ALIGN) 4 MG CAPS Take 1 capsule by mouth daily.  . [DISCONTINUED] Levothyroxine Sodium (TIROSINT) 137 MCG CAPS TAKE 1 CAPSULE (137 MCG TOTAL) BY MOUTH DAILY BEFORE BREAKFAST.   No facility-administered encounter medications on file as of 06/16/2019.   ALLERGIES: Allergies  Allergen Reactions  . Gluten Meal Rash  . Zoloft [Sertraline Hcl] Other (See Comments)    STOMACH ACHES   VACCINATION STATUS: Immunization History  Administered Date(s) Administered  . Influenza,inj,Quad PF,6+ Mos 11/22/2015, 03/05/2017, 04/07/2018, 11/04/2018    HPI   Crystal Waters  is a patient with the above medical history.  She is being engaged in telehealth via telephone for postsurgical hypothyroidism.   Her prior thyroid history is not available to review.  Her thyroid history starts in April 2001 when she underwent total thyroidectomy for thyroid malignancy.  She recalls being treated with radioactive iodine remnant ablation following her surgery.  After her last visit, she underwent thyroid/neck ultrasound which reveals no remnant thyroid tissue or masses or signs of  recurrence in the thyroid bed/neck.   -She is currently on Tirosint 137 mcg p.o. daily before breakfast.  She reports compliance with medication.  She has history of significant intolerance to levothyroxine, Synthroid.  Her previsit thyroid function tests are consistent with appropriate replacement.   She denies palpitations, tremors, irritability. She denies  family history of thyroid malignancy, however thyroid dysfunction in multiple family members including her mother and her siblings.  -She denies feeling nodules in neck, hoarseness, dysphagia/odynophagia, SOB with lying down.  No h/o radiation tx to head or neck.   I reviewed her chart and she also has a hypertension on treatment, GERD on Prilosec.    ROS: Limited as above.  Physical Exam: There were no vitals taken for this visit. Wt Readings from Last 3 Encounters:  05/11/19 229 lb (103.9 kg)  01/28/19 233 lb (105.7 kg)  01/21/19 232 lb (105.2 kg)     Diabetic Labs (most recent): Lab Results  Component Value Date   HGBA1C 5.4 04/24/2017   HGBA1C 5.3 05/15/2016   HGBA1C 5.4 11/22/2015     Lipid Panel ( most recent) Lipid Panel     Component Value Date/Time   CHOL 257 (H) 05/11/2019 0901   TRIG 167 (H) 05/11/2019 0901   HDL 73 05/11/2019 0901   CHOLHDL 3.5 05/11/2019 0901   VLDL 34 (H) 05/15/2016 1143   LDLCALC 154 (H) 05/11/2019 0901     Recent Results (from the past 2160 hour(s))  CBC with Differential/Platelet     Status: None   Collection Time: 05/11/19  9:01 AM  Result Value Ref Range   WBC 6.3 3.8 - 10.8 Thousand/uL   RBC 4.52 3.80 - 5.10 Million/uL   Hemoglobin 13.4 11.7 - 15.5 g/dL   HCT 41.1 35.0 - 45.0 %   MCV 90.9 80.0 - 100.0 fL   MCH 29.6 27.0 - 33.0 pg   MCHC 32.6 32.0 - 36.0 g/dL   RDW 12.7 11.0 - 15.0 %   Platelets 339 140 - 400 Thousand/uL   MPV 10.3 7.5 - 12.5 fL   Neutro Abs 4,158 1,500 - 7,800 cells/uL   Lymphs Abs 1,430 850 - 3,900 cells/uL   Absolute Monocytes 567 200 - 950  cells/uL   Eosinophils Absolute 88 15 - 500 cells/uL   Basophils Absolute 57 0 - 200 cells/uL   Neutrophils Relative % 66 %   Total Lymphocyte 22.7 %   Monocytes Relative 9.0 %   Eosinophils Relative 1.4 %   Basophils Relative 0.9 %  Comprehensive metabolic panel     Status: Abnormal   Collection Time: 05/11/19  9:01 AM  Result Value Ref Range   Glucose, Bld 106 (H) 65 - 99 mg/dL    Comment: .            Fasting reference interval . For someone without known diabetes, a glucose value between 100 and 125 mg/dL is consistent with prediabetes and should be confirmed with a follow-up test. .    BUN 19 7 - 25 mg/dL   Creat 1.08 (H) 0.50 - 0.99 mg/dL    Comment: For patients >48 years of age, the reference limit for Creatinine is approximately 13% higher for people identified as African-American. .    BUN/Creatinine Ratio 18 6 - 22 (calc)   Sodium 141 135 - 146 mmol/L   Potassium 4.9 3.5 - 5.3 mmol/L   Chloride 104 98 - 110 mmol/L   CO2 28 20 - 32 mmol/L   Calcium 9.9 8.6 - 10.4 mg/dL   Total Protein 6.9 6.1 - 8.1 g/dL   Albumin 4.1 3.6 - 5.1 g/dL   Globulin 2.8 1.9 - 3.7 g/dL (calc)   AG Ratio 1.5 1.0 - 2.5 (calc)   Total Bilirubin 0.4 0.2 - 1.2 mg/dL   Alkaline phosphatase (APISO) 88 37 -  153 U/L   AST 13 10 - 35 U/L   ALT 11 6 - 29 U/L  Lipid panel     Status: Abnormal   Collection Time: 05/11/19  9:01 AM  Result Value Ref Range   Cholesterol 257 (H) <200 mg/dL   HDL 73 > OR = 50 mg/dL   Triglycerides 167 (H) <150 mg/dL   LDL Cholesterol (Calc) 154 (H) mg/dL (calc)    Comment: Reference range: <100 . Desirable range <100 mg/dL for primary prevention;   <70 mg/dL for patients with CHD or diabetic patients  with > or = 2 CHD risk factors. Marland Kitchen LDL-C is now calculated using the Martin-Hopkins  calculation, which is a validated novel method providing  better accuracy than the Friedewald equation in the  estimation of LDL-C.  Cresenciano Genre et al. Annamaria Helling. [id number]):  2061-2068  (http://education.[facility].com/faq/FAQ164)    Total CHOL/HDL Ratio 3.5 <5.0 (calc)   Non-HDL Cholesterol (Calc) 184 (H) <130 mg/dL (calc)    Comment: For patients with diabetes plus 1 major ASCVD risk  factor, treating to a non-HDL-C goal of <100 mg/dL  (LDL-C of <70 mg/dL) is considered a therapeutic  option.   T4, free     Status: None   Collection Time: 05/11/19  9:01 AM  Result Value Ref Range   Free T4 1.3 0.8 - 1.8 ng/dL  TSH     Status: Abnormal   Collection Time: 05/11/19  9:01 AM  Result Value Ref Range   TSH 5.74 (H) 0.40 - 4.50 mIU/L      ASSESSMENT: 1. Postsurgical Hypothyroidism 2. History of thyroid malignancy  PLAN:    Patient with long-standing postsurgical hypothyroidism after thyroidectomy for thyroid cancer in April 2001.  -Her previsit thyroid function tests are consistent with slight under replacement.  I discussed and increased her Tirosint to 150 mcg p.o. daily before breakfast.    - We discussed about the correct intake of her thyroid hormone, on empty stomach at fasting, with water, separated by at least 30 minutes from breakfast and other medications,  and separated by more than 4 hours from calcium, iron, multivitamins, acid reflux medications (PPIs). -Patient is made aware of the fact that thyroid hormone replacement is needed for life, dose to be adjusted by periodic monitoring of thyroid function tests.   Per her description, her initial treatment for thyroid malignancy appears to be complete.    Her treatment was completed approximately 18 years ago-she is likely in remission.  She will not need further intervention for history of thyroid malignancy at this time.       - Time spent on this patient care encounter:  25 minutes of which 50% was spent in  counseling and the rest reviewing  her current and  previous labs / studies and medications  doses and developing a plan for long term care. Crystal Waters  participated in  the discussions, expressed understanding, and voiced agreement with the above plans.  All questions were answered to her satisfaction. she is encouraged to contact clinic should she have any questions or concerns prior to her return visit.    Return in about 3 months (around 09/16/2019) for Follow up with Pre-visit Labs.  Glade Lloyd, MD [facility] [address] [place],  [address] Phone: [phone]  Fax: [phone]   06/16/2019, 9:26 PM  This note was partially dictated with voice recognition software. Similar sounding words can be transcribed inadequately or may not  be corrected  upon review.

## 2019-06-17 ENCOUNTER — Ambulatory Visit
Admission: RE | Admit: 2019-06-17 | Discharge: 2019-06-17 | Disposition: A | Discharge status: Home / Self Care | Payer: BC Managed Care – PPO | Source: Ambulatory Visit | Attending: Family Medicine | Admitting: Family Medicine

## 2019-06-17 ENCOUNTER — Other Ambulatory Visit: Payer: Self-pay

## 2019-06-17 DIAGNOSIS — Z1231 Encounter for screening mammogram for malignant neoplasm of breast: Secondary | ICD-10-CM

## 2019-06-22 ENCOUNTER — Other Ambulatory Visit: Payer: Self-pay

## 2019-06-22 MED ORDER — LEVOTHYROXINE SODIUM 150 MCG PO TABS: 150.0000 ug | ORAL_TABLET | Freq: Every day | ORAL | 1 refills | Status: DC

## 2019-06-24 ENCOUNTER — Encounter: Payer: Self-pay | Admitting: Family Medicine

## 2019-06-29 ENCOUNTER — Other Ambulatory Visit: Payer: Self-pay | Admitting: "Endocrinology

## 2019-07-06 DIAGNOSIS — Z01818 Encounter for other preprocedural examination: Secondary | ICD-10-CM | POA: Diagnosis not present

## 2019-07-14 DIAGNOSIS — M1712 Unilateral primary osteoarthritis, left knee: Secondary | ICD-10-CM | POA: Diagnosis not present

## 2019-07-30 ENCOUNTER — Encounter: Payer: Self-pay | Admitting: Adult Health

## 2019-07-30 ENCOUNTER — Telehealth (INDEPENDENT_AMBULATORY_CARE_PROVIDER_SITE_OTHER): Payer: BC Managed Care – PPO | Admitting: Adult Health

## 2019-07-30 DIAGNOSIS — Z9989 Dependence on other enabling machines and devices: Secondary | ICD-10-CM

## 2019-07-30 DIAGNOSIS — G4733 Obstructive sleep apnea (adult) (pediatric): Secondary | ICD-10-CM

## 2019-07-30 NOTE — Progress Notes (Signed)
PATIENT: Crystal Waters DOB: 12-Sep-1955  REASON FOR VISIT: follow up HISTORY FROM: patient  Virtual Visit via Video Note  I connected with Crystal Waters on 07/30/19 at  3:00 PM EDT by a video enabled telemedicine application located remotely at [facility] and verified that I am speaking with the correct person using two identifiers who was located at their own home.   I discussed the limitations of evaluation and management by telemedicine and the availability of in person appointments. The patient expressed understanding and agreed to proceed.   PATIENT: Crystal Waters DOB: 1956/02/08  REASON FOR VISIT: follow up HISTORY FROM: patient  HISTORY OF PRESENT ILLNESS: Today 07/30/19:  Crystal Waters is 64 year old female with a history of OSA on cpap. She returns today for follow-up. Her download indicates that she used her machine 30/30 days for compliance of 30 %. She used her machine >4 hours 29/30 for a complaince of 97 percent.  She use her machine on average 6 hours and 40 minutes.  Her residual AHI is 3.1 on 5 to 11 cm of water with EPR 3.  Leak in the 95th percentile is 13.5 L/min.  She reports that the CPAP has been working well for her.  She recently had to knee surgeries and reports that that has interrupted her sleep some.  She returns today for an evaluation.  HISTORY 01/28/2019: I reviewed her AutoPap compliance data from 12/28/2018 through 01/26/2019 which is a total of 30 days, during which time she used her AutoPap every night with percent use days greater than 4 hours at 100%, indicating superb compliance with an average usage of 7 hours and 1 minutes, residual AHI at goal at 2.4/h, average 95th percentile of pressure at 7.8 cm with a range of 5 cm to 11 cm with EPR, leak on the low side with a 95th percentile at 3.8 L/min.  She reports feeling better with her AutoPap. In particular, she is not as tired during the day, better energy level, sleep is more  nonrestorative and she even has had dreams. She continues to struggle with bilateral knee pain, she sees Dr. Ricki Rodriguez for this.  She eventually will need knee replacement surgeries. She still has middle of the night awakenings.  However, she can often go back to sleep.  Sometimes she takes Xanax at night. She uses it cautiously and infrequently.  REVIEW OF SYSTEMS: Out of a complete 14 system review of symptoms, the patient complains only of the following symptoms, and all other reviewed systems are negative.  See HPI  ALLERGIES: Allergies  Allergen Reactions  . Gluten Meal Rash  . Zoloft [Sertraline Hcl] Other (See Comments)    STOMACH ACHES    HOME MEDICATIONS: Outpatient Medications Prior to Visit  Medication Sig Dispense Refill  . acetaminophen (TYLENOL) 500 MG tablet Take 500 mg every 8 (eight) hours as needed by mouth.    . ALPRAZolam (XANAX) 0.5 MG tablet Take 1 tab BID prn anxiety (Patient not taking: Reported on 05/11/2019) 45 tablet 1  . amphetamine-dextroamphetamine (ADDERALL XR) 20 MG 24 hr capsule Take 1 capsule (20 mg total) by mouth daily. (Patient not taking: Reported on 05/11/2019) 30 capsule 0  . buPROPion (WELLBUTRIN XL) 300 MG 24 hr tablet TAKE 1 TABLET BY MOUTH EVERY DAY 90 tablet 1  . Cholecalciferol (VITAMIN D) 2000 UNITS tablet Take 2,000 Units by mouth daily.    . clotrimazole-betamethasone (LOTRISONE) cream Apply 1 application topically 2 (two) times daily  as needed. 60 g 1  . diclofenac sodium (VOLTAREN) 1 % GEL PA [id number] approved 03/25/2018- 04/24/2019. (Patient not taking: Reported on 05/11/2019) 100 g 2  . ibuprofen (ADVIL,MOTRIN) 200 MG tablet Take 400 mg by mouth every 8 (eight) hours as needed for mild pain or moderate pain.    Marland Kitchen levothyroxine (SYNTHROID) 150 MCG tablet Take 1 tablet (150 mcg total) by mouth daily before breakfast. 90 tablet 1  . lisinopril (ZESTRIL) 10 MG tablet Take 1 tablet (10 mg total) by mouth daily. 90 tablet 3  . omeprazole (PRILOSEC)  40 MG capsule Take 1 capsule (40 mg total) by mouth daily. 90 capsule 3  . ondansetron (ZOFRAN ODT) 4 MG disintegrating tablet Take 1 tablet (4 mg total) by mouth every 8 (eight) hours as needed for nausea or vomiting. 20 tablet 2  . OVER THE COUNTER MEDICATION Nasal spay qhs    . Probiotic Product (ALIGN) 4 MG CAPS Take 1 capsule by mouth daily.     No facility-administered medications prior to visit.    PAST MEDICAL HISTORY: Past Medical History:  Diagnosis Date  . Allergy    gluten, seasonal  . Anxiety   . Arthritis   . Asthma    seasonal   . Cancer ([place])    Thyroid  . Complication of anesthesia   . Constipation   . Depression   . GERD (gastroesophageal reflux disease)   . Gestational diabetes mellitus    with 1 child.    . H/O nose injury    accidently hit in the nose, has a stuffy nose, can't lie flat, wears nasal strips at night  . History of kidney stones      x 2 passed  . Hypertension   . Hypothyroidism   . PONV (postoperative nausea and vomiting)   . PTSD (post-traumatic stress disorder)   . Thyroid cancer ([place])   . Thyroid disease     PAST SURGICAL HISTORY: Past Surgical History:  Procedure Laterality Date  . CHOLECYSTECTOMY N/A 04/11/2016   Procedure: LAPAROSCOPIC CHOLECYSTECTOMY WITH INTRAOPERATIVE CHOLANGIOGRAM;  Surgeon: Georganna Skeans, MD;  Location: [place];  Service: General;  Laterality: N/A;  . COLONOSCOPY    . KNEE SURGERY Right 2009  . LAPAROTOMY N/A 04/17/2016   Procedure: LAPAROTOMY REPAIR OF INCARCERATED INCISIONAL HERNIA WITH VAC PLACEMENT;  Surgeon: Rolm Bookbinder, MD;  Location: [place];  Service: General;  Laterality: N/A;  . THYROID SURGERY     2001  . TUBAL LIGATION      FAMILY HISTORY: Family History  Problem Relation Age of Onset  . Heart disease Mother   . Diabetes Father   . Heart disease Father   . Hyperlipidemia Father   . Hypertension Father   . Diabetes Brother   . Autism Son   . Lupus Maternal Grandmother   . Heart  attack Maternal Grandfather   . Cancer Paternal Grandmother   . Kidney Stones Sister   . Colon cancer Neg Hx   . Rectal cancer Neg Hx   . Esophageal cancer Neg Hx   . Liver cancer Neg Hx   . Stomach cancer Neg Hx     SOCIAL HISTORY: Social History   Socioeconomic History  . Marital status: Divorced    Spouse name: Not on file  . Number of children: 2  . Years of education: Not on file  . Highest education level: Not on file  Occupational History  . Occupation: lead teller  Tobacco Use  . Smoking status:  Former Smoker  . Smokeless tobacco: Never Used  . Tobacco comment: social smoker- never a habit  Substance and Sexual Activity  . Alcohol use: Yes    Comment: occasionally   . Drug use: No  . Sexual activity: Not Currently  Other Topics Concern  . Not on file  Social History Narrative  . Not on file   Social Determinants of Health   Financial Resource Strain:   . Difficulty of Paying Living Expenses:   Food Insecurity:   . Worried About Charity fundraiser in the Last Year:   . Arboriculturist in the Last Year:   Transportation Needs:   . Film/video editor (Medical):   Marland Kitchen Lack of Transportation (Non-Medical):   Physical Activity:   . Days of Exercise per Week:   . Minutes of Exercise per Session:   Stress:   . Feeling of Stress :   Social Connections:   . Frequency of Communication with Friends and Family:   . Frequency of Social Gatherings with Friends and Family:   . Attends Religious Services:   . Active Member of Clubs or Organizations:   . Attends Archivist Meetings:   Marland Kitchen Marital Status:   Intimate Partner Violence:   . Fear of Current or Ex-Partner:   . Emotionally Abused:   Marland Kitchen Physically Abused:   . Sexually Abused:       PHYSICAL EXAM Generalized: Well developed, in no acute distress   Neurological examination  Mentation: Alert oriented to time, place, history taking. Follows all commands speech and language fluent Cranial  nerve II-XII:Extraocular movements were full. Facial symmetry noted. uvula tongue midline. Head turning and shoulder shrug  were normal and symmetric. Motor: Good strength throughout subjectively per patient Sensory: Sensory testing is intact to soft touch on all 4 extremities subjectively per patient Coordination: Cerebellar testing reveals good finger-nose-finger  Gait and station: Patient is able to stand from a seated position. gait is normal.  Reflexes: UTA  DIAGNOSTIC DATA (LABS, IMAGING, TESTING) - I reviewed patient records, labs, notes, testing and imaging myself where available.  Lab Results  Component Value Date   WBC 6.3 05/11/2019   HGB 13.4 05/11/2019   HCT 41.1 05/11/2019   MCV 90.9 05/11/2019   PLT 339 05/11/2019      Component Value Date/Time   NA 141 05/11/2019 0901   K 4.9 05/11/2019 0901   CL 104 05/11/2019 0901   CO2 28 05/11/2019 0901   GLUCOSE 106 (H) 05/11/2019 0901   BUN 19 05/11/2019 0901   CREATININE 1.08 (H) 05/11/2019 0901   CALCIUM 9.9 05/11/2019 0901   PROT 6.9 05/11/2019 0901   ALBUMIN 3.8 08/21/2016 0850   AST 13 05/11/2019 0901   ALT 11 05/11/2019 0901   ALKPHOS 57 08/21/2016 0850   BILITOT 0.4 05/11/2019 0901   GFRNONAA >60 04/22/2016 0327   GFRNONAA 60 04/11/2015 1656   GFRAA >60 04/22/2016 0327   GFRAA 69 04/11/2015 1656   Lab Results  Component Value Date   CHOL 257 (H) 05/11/2019   HDL 73 05/11/2019   LDLCALC 154 (H) 05/11/2019   TRIG 167 (H) 05/11/2019   CHOLHDL 3.5 05/11/2019   Lab Results  Component Value Date   HGBA1C 5.4 04/24/2017   No results found for: [id number] Lab Results  Component Value Date   TSH 5.74 (H) 05/11/2019      ASSESSMENT AND PLAN 64 y.o. year old female  has a past medical history of  Allergy, Anxiety, Arthritis, Asthma, Cancer ([place]), Complication of anesthesia, Constipation, Depression, GERD (gastroesophageal reflux disease), Gestational diabetes mellitus, H/O nose injury, History of kidney  stones, Hypertension, Hypothyroidism, PONV (postoperative nausea and vomiting), PTSD (post-traumatic stress disorder), Thyroid cancer ([place]), and Thyroid disease. here with :  1.  Obstructive sleep apnea on CPAP  -Good compliance -Good treatment of apnea -Encouraged patient continues in nursing nightly and greater than 4 hours each night -Follow-up in 1 year or sooner if needed   I spent  20 minutes of face-to-face and non-face-to-face time with patient.  This included previsit chart review, lab review, study review, order entry, electronic health record documentation, patient education.    Ward Givens, MSN, NP-C 07/30/2019, 3:14 PM [facility] [address], [place], [place] [address] [phone]

## 2019-08-10 ENCOUNTER — Other Ambulatory Visit: Payer: Self-pay

## 2019-08-11 ENCOUNTER — Other Ambulatory Visit: Payer: Self-pay

## 2019-08-11 ENCOUNTER — Other Ambulatory Visit: Payer: Self-pay | Admitting: Family Medicine

## 2019-08-11 ENCOUNTER — Other Ambulatory Visit: Payer: BC Managed Care – PPO

## 2019-08-11 DIAGNOSIS — E7800 Pure hypercholesterolemia, unspecified: Secondary | ICD-10-CM | POA: Diagnosis not present

## 2019-08-11 LAB — LIPID PANEL
Cholesterol: 221 mg/dL — ABNORMAL HIGH (ref <200)
HDL: 71 mg/dL (ref >=50)
LDL Cholesterol (Calc): 123 mg/dL (calc) — ABNORMAL HIGH
Non-HDL Cholesterol (Calc): 150 mg/dL (calc) — ABNORMAL HIGH (ref <130)
Total CHOL/HDL Ratio: 3.1 (calc) (ref <5.0)
Triglycerides: 152 mg/dL — ABNORMAL HIGH (ref <150)

## 2019-08-11 NOTE — Telephone Encounter (Signed)
Last office visit: 04/21/2019 Last refill: 01/21/2019

## 2019-08-12 ENCOUNTER — Encounter: Payer: Self-pay | Admitting: *Deleted

## 2019-08-18 DIAGNOSIS — Z471 Aftercare following joint replacement surgery: Secondary | ICD-10-CM | POA: Diagnosis not present

## 2019-08-18 DIAGNOSIS — Z96652 Presence of left artificial knee joint: Secondary | ICD-10-CM | POA: Diagnosis not present

## 2019-08-26 DIAGNOSIS — G4733 Obstructive sleep apnea (adult) (pediatric): Secondary | ICD-10-CM | POA: Diagnosis not present

## 2019-09-09 ENCOUNTER — Encounter: Payer: Self-pay | Admitting: Family Medicine

## 2019-09-10 NOTE — Telephone Encounter (Signed)
Ok to refill??  Last office visit 05/11/2019.  Last refill 01/21/2019, #1 refill.

## 2019-09-11 MED ORDER — ALPRAZOLAM 0.5 MG PO TABS: ORAL_TABLET | ORAL | 1 refills | Status: DC

## 2019-09-25 ENCOUNTER — Ambulatory Visit: Payer: BC Managed Care – PPO | Admitting: "Endocrinology

## 2019-10-02 ENCOUNTER — Telehealth: Payer: Self-pay

## 2019-10-02 DIAGNOSIS — E890 Postprocedural hypothyroidism: Secondary | ICD-10-CM | POA: Diagnosis not present

## 2019-10-02 LAB — TSH: TSH: 2.07 mIU/L (ref 0.40–4.50)

## 2019-10-02 LAB — T4, FREE: Free T4: 1.7 ng/dL (ref 0.8–1.8)

## 2019-10-02 NOTE — Telephone Encounter (Signed)
ICe the area, it is most likley a hematoma is should go down in a few days  If any pus, then schedule OV or go to UC

## 2019-10-02 NOTE — Telephone Encounter (Signed)
Patient stopped by office and made aware.

## 2019-10-02 NOTE — Telephone Encounter (Signed)
Pt called and stated that she had her blood drawn about 45 minutes ago and it left a bubble. She states that there is no bruise or discoloration. Pt wants to know if it is concerning for a bubble to pop up where the blood was drawn? Please advise.

## 2019-10-12 ENCOUNTER — Other Ambulatory Visit: Payer: Self-pay

## 2019-10-12 ENCOUNTER — Ambulatory Visit: Payer: BC Managed Care – PPO | Admitting: "Endocrinology

## 2019-10-12 ENCOUNTER — Encounter: Payer: Self-pay | Admitting: "Endocrinology

## 2019-10-12 VITALS — BP 112/66 | HR 76 | Ht 67.0 in | Wt 247.8 lb

## 2019-10-12 DIAGNOSIS — Z85850 Personal history of malignant neoplasm of thyroid: Secondary | ICD-10-CM

## 2019-10-12 DIAGNOSIS — E890 Postprocedural hypothyroidism: Secondary | ICD-10-CM

## 2019-10-12 MED ORDER — LEVOTHYROXINE SODIUM 137 MCG PO TABS: 137.0000 ug | ORAL_TABLET | Freq: Every day | ORAL | 1 refills | Status: DC

## 2019-10-12 NOTE — Progress Notes (Signed)
10/12/2019, 7:38 PM                  Endocrinology follow-up note   Crystal Waters is a 64 y.o.-year-old female patient being seen in follow-up for postsurgical hypothyroidism, history of thyroid malignancy.   PMD:  Alycia Rossetti, MD.   Past Medical History:  Diagnosis Date  . Allergy    gluten, seasonal  . Anxiety   . Arthritis   . Asthma    seasonal   . Cancer ([place])    Thyroid  . Complication of anesthesia   . Constipation   . Depression   . GERD (gastroesophageal reflux disease)   . Gestational diabetes mellitus    with 1 child.    . H/O nose injury    accidently hit in the nose, has a stuffy nose, can't lie flat, wears nasal strips at night  . History of kidney stones      x 2 passed  . Hypertension   . Hypothyroidism   . PONV (postoperative nausea and vomiting)   . PTSD (post-traumatic stress disorder)   . Thyroid cancer ([place])   . Thyroid disease    Past Surgical History:  Procedure Laterality Date  . CHOLECYSTECTOMY N/A 04/11/2016   Procedure: LAPAROSCOPIC CHOLECYSTECTOMY WITH INTRAOPERATIVE CHOLANGIOGRAM;  Surgeon: Georganna Skeans, MD;  Location: [place];  Service: General;  Laterality: N/A;  . COLONOSCOPY    . KNEE SURGERY Right 2009  . LAPAROTOMY N/A 04/17/2016   Procedure: LAPAROTOMY REPAIR OF INCARCERATED INCISIONAL HERNIA WITH VAC PLACEMENT;  Surgeon: Rolm Bookbinder, MD;  Location: [place];  Service: General;  Laterality: N/A;  . THYROID SURGERY     2001  . TUBAL LIGATION     Social History   Socioeconomic History  . Marital status: Divorced    Spouse name: Not on file  . Number of children: 2  . Years of education: Not on file  . Highest education level: Not on file  Occupational History  . Occupation: lead teller  Tobacco Use  . Smoking status: Former Research scientist (life sciences)  . Smokeless tobacco: Never Used  . Tobacco comment: social smoker- never a habit  Vaping Use  . Vaping  Use: Never used  Substance and Sexual Activity  . Alcohol use: Yes    Comment: occasionally   . Drug use: No  . Sexual activity: Not Currently  Other Topics Concern  . Not on file  Social History Narrative  . Not on file   Social Determinants of Health   Financial Resource Strain:   . Difficulty of Paying Living Expenses:   Food Insecurity:   . Worried About Charity fundraiser in the Last Year:   . Arboriculturist in the Last Year:   Transportation Needs:   . Film/video editor (Medical):   Marland Kitchen Lack of Transportation (Non-Medical):   Physical Activity:   . Days of Exercise per Week:   . Minutes of Exercise per Session:   Stress:   . Feeling of Stress :   Social Connections:   .  Frequency of Communication with Friends and Family:   . Frequency of Social Gatherings with Friends and Family:   . Attends Religious Services:   . Active Member of Clubs or Organizations:   . Attends Archivist Meetings:   Marland Kitchen Marital Status:    Outpatient Encounter Medications as of 10/12/2019  Medication Sig  . acetaminophen (TYLENOL) 500 MG tablet Take 500 mg every 8 (eight) hours as needed by mouth.  . ALPRAZolam (XANAX) 0.5 MG tablet Take 1 tab BID prn anxiety  . amphetamine-dextroamphetamine (ADDERALL XR) 20 MG 24 hr capsule Take 1 capsule (20 mg total) by mouth daily. (Patient not taking: Reported on 05/11/2019)  . buPROPion (WELLBUTRIN XL) 300 MG 24 hr tablet TAKE 1 TABLET BY MOUTH EVERY DAY  . Cholecalciferol (VITAMIN D) 2000 UNITS tablet Take 2,000 Units by mouth daily.  . clotrimazole-betamethasone (LOTRISONE) cream Apply 1 application topically 2 (two) times daily as needed.  . diclofenac Sodium (VOLTAREN) 1 % GEL APPLY AS IDRECTED  . ibuprofen (ADVIL,MOTRIN) 200 MG tablet Take 400 mg by mouth every 8 (eight) hours as needed for mild pain or moderate pain.  Marland Kitchen levothyroxine (SYNTHROID) 137 MCG tablet Take 1 tablet (137 mcg total) by mouth daily before breakfast.  . lisinopril  (ZESTRIL) 10 MG tablet Take 1 tablet (10 mg total) by mouth daily.  Marland Kitchen omeprazole (PRILOSEC) 40 MG capsule Take 1 capsule (40 mg total) by mouth daily.  . ondansetron (ZOFRAN ODT) 4 MG disintegrating tablet Take 1 tablet (4 mg total) by mouth every 8 (eight) hours as needed for nausea or vomiting.  Marland Kitchen OVER THE COUNTER MEDICATION Nasal spay qhs  . Probiotic Product (ALIGN) 4 MG CAPS Take 1 capsule by mouth daily.  . [DISCONTINUED] levothyroxine (SYNTHROID) 150 MCG tablet Take 1 tablet (150 mcg total) by mouth daily before breakfast.   No facility-administered encounter medications on file as of 10/12/2019.   ALLERGIES: Allergies  Allergen Reactions  . Gluten Meal Rash  . Zoloft [Sertraline Hcl] Other (See Comments)    STOMACH ACHES   VACCINATION STATUS: Immunization History  Administered Date(s) Administered  . Influenza,inj,Quad PF,6+ Mos 11/22/2015, 03/05/2017, 04/07/2018, 11/04/2018    HPI   Crystal Waters  is a patient with the above medical history.  She is returning with repeat thyroid function tests for follow-up of  postsurgical hypothyroidism.   Her prior thyroid history is not available to review.  Her thyroid history starts in April 2001 when she underwent total thyroidectomy for thyroid malignancy.  She recalls being treated with radioactive iodine remnant ablation following her surgery.  After her last visit, she underwent thyroid/neck ultrasound which reveals no remnant thyroid tissue or masses or signs of recurrence in the thyroid bed/neck.   -She is currently on Synthroid 150 mcg p.o. daily before breakfast.  She noticed increased appetite which caused weight gain since last visit.    Her previsit thyroid function tests are consistent with appropriate replacement.   She denies palpitations, tremors, irritability. She denies family history of thyroid malignancy, however thyroid dysfunction in multiple family members including her mother and her siblings.  -She denies  feeling nodules in neck, hoarseness, dysphagia/odynophagia, SOB with lying down.  No h/o radiation tx to head or neck.   I reviewed her chart and she also has a hypertension on treatment, GERD on Prilosec.    ROS: Limited as above.  Physical Exam: BP 112/66   Pulse 76   Ht 5\' 7"  (1.702 m)   Wt (!) 247  lb 12.8 oz (112.4 kg)   BMI 38.81 kg/m  Wt Readings from Last 3 Encounters:  10/12/19 (!) 247 lb 12.8 oz (112.4 kg)  05/11/19 229 lb (103.9 kg)  01/28/19 233 lb (105.7 kg)     Diabetic Labs (most recent): Lab Results  Component Value Date   HGBA1C 5.4 04/24/2017   HGBA1C 5.3 05/15/2016   HGBA1C 5.4 11/22/2015     Lipid Panel ( most recent) Lipid Panel     Component Value Date/Time   CHOL 221 (H) 08/11/2019 0831   TRIG 152 (H) 08/11/2019 0831   HDL 71 08/11/2019 0831   CHOLHDL 3.1 08/11/2019 0831   VLDL 34 (H) 05/15/2016 1143   LDLCALC 123 (H) 08/11/2019 0831     Recent Results (from the past 2160 hour(s))  Lipid panel     Status: Abnormal   Collection Time: 08/11/19  8:31 AM  Result Value Ref Range   Cholesterol 221 (H) <200 mg/dL   HDL 71 > OR = 50 mg/dL   Triglycerides 152 (H) <150 mg/dL   LDL Cholesterol (Calc) 123 (H) mg/dL (calc)    Comment: Reference range: <100 . Desirable range <100 mg/dL for primary prevention;   <70 mg/dL for patients with CHD or diabetic patients  with > or = 2 CHD risk factors. Marland Kitchen LDL-C is now calculated using the Martin-Hopkins  calculation, which is a validated novel method providing  better accuracy than the Friedewald equation in the  estimation of LDL-C.  Cresenciano Genre et al. Annamaria Helling. 6378;588(50): 2061-2068  (http://education.[facility].com/faq/FAQ164)    Total CHOL/HDL Ratio 3.1 <5.0 (calc)   Non-HDL Cholesterol (Calc) 150 (H) <130 mg/dL (calc)    Comment: For patients with diabetes plus 1 major ASCVD risk  factor, treating to a non-HDL-C goal of <100 mg/dL  (LDL-C of <70 mg/dL) is considered a therapeutic  option.    TSH     Status: None   Collection Time: 10/02/19  2:26 PM  Result Value Ref Range   TSH 2.07 0.40 - 4.50 mIU/L  T4, free     Status: None   Collection Time: 10/02/19  2:26 PM  Result Value Ref Range   Free T4 1.7 0.8 - 1.8 ng/dL      ASSESSMENT: 1. Postsurgical Hypothyroidism 2. History of thyroid malignancy  PLAN:    Patient with long-standing postsurgical hypothyroidism after thyroidectomy for thyroid cancer in April 2001.  -Her previsit thyroid function tests are consistent with appropriate replacement, due to her concern of increased appetite, I discussed and lowered her Synthroid to 137 mcg p.o. daily before breakfast.  Her weight gain is likely due to increased caloric intake.   - We discussed about the correct intake of her thyroid hormone, on empty stomach at fasting, with water, separated by at least 30 minutes from breakfast and other medications,  and separated by more than 4 hours from calcium, iron, multivitamins, acid reflux medications (PPIs). -Patient is made aware of the fact that thyroid hormone replacement is needed for life, dose to be adjusted by periodic monitoring of thyroid function tests.   Per her description, her initial treatment for thyroid malignancy appears to be complete.    Her treatment was completed approximately 18 years ago-she is likely in remission.  Prior to her last visit, thyroid/neck surveillance ultrasound was negative for residual thyroid tissue nor recurrence.    She will not need further intervention for history of thyroid malignancy at this time.  She is advised to maintain close follow-up with  her PMD     - Time spent on this patient care encounter:  20 minutes of which 50% was spent in  counseling and the rest reviewing  her current and  previous labs / studies and medications  doses and developing a plan for long term care. Orson Gear  participated in the discussions, expressed understanding, and voiced agreement with the  above plans.  All questions were answered to her satisfaction. she is encouraged to contact clinic should she have any questions or concerns prior to her return visit.    Return in about 3 months (around 01/12/2020) for F/U with Pre-visit Labs.  Glade Lloyd, MD [facility] [address] [place], [place] [address] Phone: [phone]  Fax: [phone]   10/12/2019, 7:38 PM  This note was partially dictated with voice recognition software. Similar sounding words can be transcribed inadequately or may not  be corrected upon review.

## 2019-11-05 ENCOUNTER — Other Ambulatory Visit: Payer: Self-pay | Admitting: Family Medicine

## 2019-12-06 ENCOUNTER — Other Ambulatory Visit: Payer: Self-pay | Admitting: Family Medicine

## 2019-12-07 ENCOUNTER — Other Ambulatory Visit: Payer: Self-pay

## 2019-12-07 ENCOUNTER — Ambulatory Visit (INDEPENDENT_AMBULATORY_CARE_PROVIDER_SITE_OTHER): Payer: BC Managed Care – PPO | Admitting: Family Medicine

## 2019-12-07 ENCOUNTER — Other Ambulatory Visit: Payer: Self-pay | Admitting: "Endocrinology

## 2019-12-07 ENCOUNTER — Encounter: Payer: Self-pay | Admitting: Family Medicine

## 2019-12-07 VITALS — BP 130/68 | HR 86 | Temp 98.4°F | Resp 14 | Ht 67.0 in | Wt 252.0 lb

## 2019-12-07 DIAGNOSIS — I10 Essential (primary) hypertension: Secondary | ICD-10-CM

## 2019-12-07 DIAGNOSIS — R7302 Impaired glucose tolerance (oral): Secondary | ICD-10-CM

## 2019-12-07 DIAGNOSIS — F411 Generalized anxiety disorder: Secondary | ICD-10-CM

## 2019-12-07 DIAGNOSIS — Z23 Encounter for immunization: Secondary | ICD-10-CM | POA: Diagnosis not present

## 2019-12-07 DIAGNOSIS — E669 Obesity, unspecified: Secondary | ICD-10-CM

## 2019-12-07 DIAGNOSIS — D1721 Benign lipomatous neoplasm of skin and subcutaneous tissue of right arm: Secondary | ICD-10-CM

## 2019-12-07 DIAGNOSIS — E038 Other specified hypothyroidism: Secondary | ICD-10-CM

## 2019-12-07 DIAGNOSIS — R635 Abnormal weight gain: Secondary | ICD-10-CM

## 2019-12-07 DIAGNOSIS — F3342 Major depressive disorder, recurrent, in full remission: Secondary | ICD-10-CM

## 2019-12-07 MED ORDER — TIROSINT 137 MCG PO CAPS: 137.0000 ug | ORAL_CAPSULE | Freq: Every day | ORAL | 1 refills | Status: DC

## 2019-12-07 NOTE — Assessment & Plan Note (Signed)
Doing well on wellbutrin no changes

## 2019-12-07 NOTE — Assessment & Plan Note (Signed)
Controlled no changes 

## 2019-12-07 NOTE — Assessment & Plan Note (Signed)
Concern about weight gain, check TFT and forward to her endocrinologist

## 2019-12-07 NOTE — Progress Notes (Signed)
   Subjective:    Patient ID: Crystal Waters, female    DOB: February 16, 1956, 64 y.o.   MRN: [id number]  Patient presents for Lump to R Arm (fatty deposit in arm- was swollen and painful after blood draw)    Pt here with lump on right arm has been present since a blood draw a few months ago, has some tenderness into her forearm, area is right below lateral anticubital fossa     Hypothyroidism- taking levothyroxine followed by endocrinology , she has noticed weight gain over the past few months very quickly concerned about thyroid medicine having gluten in it   last TSH done in July    GAD/MDD- taking wellbutrin and xanax as needed   ADD- doesn't take her  Adderrall 20mg  regulary since she is not working     HTN- taking lisinopril      Review Of Systems:  GEN- denies fatigue, fever, weight loss,weakness, recent illness HEENT- denies eye drainage, change in vision, nasal discharge, CVS- denies chest pain, palpitations RESP- denies SOB, cough, wheeze ABD- denies N/V, change in stools, abd pain GU- denies dysuria, hematuria, dribbling, incontinence MSK- denies joint pain, muscle aches, injury Neuro- denies headache, dizziness, syncope, seizure activity       Objective:    BP 130/68   Pulse 86   Temp 98.4 F (36.9 C) (Temporal)   Resp 14   Ht 5\' 7"  (1.702 m)   Wt 252 lb (114.3 kg)   SpO2 97%   BMI 39.47 kg/m  GEN- NAD, alert and oriented x3 HEENT- PERRL, EOMI, non injected sclera, pink conjunctiva, MMM, oropharynx clear Neck- Supple, no thyromegaly CVS- RRR, no murmur RESP-CTAB ABD-NABS,soft,NT,ND EXT- No edema Psych normal affect and mood MSK- Right arm fatty swelling TTP right inner elbow/forearm Pulses- Radial, DP- 2+        Assessment & Plan:      Problem List Items Addressed This Visit      Unprioritized   Class 2 obesity   Essential hypertension, benign - Primary    Controlled no changes       Relevant Orders   CBC with Differential/Platelet    Comprehensive metabolic panel   GAD (generalized anxiety disorder)   Glucose intolerance (impaired glucose tolerance)   Relevant Orders   Hemoglobin A1c   Hypothyroidism    Concern about weight gain, check TFT and forward to her endocrinologist       Relevant Orders   TSH   T4, free   Major depressive disorder    Doing well on wellbutrin no changes        Other Visit Diagnoses    Weight gain       Need for immunization against influenza       Relevant Orders   Flu Vaccine QUAD 36+ mos IM (Completed)   Lipoma of right upper extremity       consistent with lipoma, obtain Ultrasound to confirm area is not pulsatile    Relevant Orders   [place] MiscellaneoUS Localization      Note: This dictation was prepared with Dragon dictation along with smaller phrase technology. Any transcriptional errors that result from this process are unintentional.

## 2019-12-07 NOTE — Patient Instructions (Addendum)
Ultrasound to be done  Flu shot to be done  We will call with lab results  F/U 4 months

## 2019-12-08 LAB — CBC WITH DIFFERENTIAL/PLATELET
Absolute Monocytes: 490 cells/uL (ref 200–950)
Basophils Absolute: 23 cells/uL (ref 0–200)
Basophils Relative: 0.4 %
Eosinophils Absolute: 80 cells/uL (ref 15–500)
Eosinophils Relative: 1.4 %
HCT: 39.6 % (ref 35.0–45.0)
Hemoglobin: 12.9 g/dL (ref 11.7–15.5)
Lymphs Abs: 1140 cells/uL (ref 850–3900)
MCH: 28.9 pg (ref 27.0–33.0)
MCHC: 32.6 g/dL (ref 32.0–36.0)
MCV: 88.8 fL (ref 80.0–100.0)
MPV: 11.8 fL (ref 7.5–12.5)
Monocytes Relative: 8.6 %
Neutro Abs: 3967 cells/uL (ref 1500–7800)
Neutrophils Relative %: 69.6 %
Platelets: 204 10*3/uL (ref 140–400)
RBC: 4.46 10*6/uL (ref 3.80–5.10)
RDW: 13.7 % (ref 11.0–15.0)
Total Lymphocyte: 20 %
WBC: 5.7 10*3/uL (ref 3.8–10.8)

## 2019-12-08 LAB — COMPREHENSIVE METABOLIC PANEL
AG Ratio: 1.5 (calc) (ref 1.0–2.5)
ALT: 11 U/L (ref 6–29)
AST: 12 U/L (ref 10–35)
Albumin: 4.1 g/dL (ref 3.6–5.1)
Alkaline phosphatase (APISO): 64 U/L (ref 37–153)
BUN/Creatinine Ratio: 18 (calc) (ref 6–22)
BUN: 19 mg/dL (ref 7–25)
CO2: 26 mmol/L (ref 20–32)
Calcium: 9.5 mg/dL (ref 8.6–10.4)
Chloride: 104 mmol/L (ref 98–110)
Creat: 1.06 mg/dL — ABNORMAL HIGH (ref 0.50–0.99)
Globulin: 2.7 g/dL (calc) (ref 1.9–3.7)
Glucose, Bld: 111 mg/dL — ABNORMAL HIGH (ref 65–99)
Potassium: 4.8 mmol/L (ref 3.5–5.3)
Sodium: 139 mmol/L (ref 135–146)
Total Bilirubin: 0.3 mg/dL (ref 0.2–1.2)
Total Protein: 6.8 g/dL (ref 6.1–8.1)

## 2019-12-08 LAB — TSH: TSH: 3 mIU/L (ref 0.40–4.50)

## 2019-12-08 LAB — HEMOGLOBIN A1C
Hgb A1c MFr Bld: 5.7 % of total Hgb — ABNORMAL HIGH (ref <5.7)
Mean Plasma Glucose: 117 (calc)
eAG (mmol/L): 6.5 (calc)

## 2019-12-08 LAB — T4, FREE: Free T4: 1.3 ng/dL (ref 0.8–1.8)

## 2019-12-09 ENCOUNTER — Other Ambulatory Visit: Payer: Self-pay | Admitting: *Deleted

## 2019-12-09 DIAGNOSIS — D1721 Benign lipomatous neoplasm of skin and subcutaneous tissue of right arm: Secondary | ICD-10-CM

## 2019-12-10 ENCOUNTER — Other Ambulatory Visit: Payer: Self-pay | Admitting: "Endocrinology

## 2019-12-11 ENCOUNTER — Ambulatory Visit
Admission: RE | Admit: 2019-12-11 | Discharge: 2019-12-11 | Disposition: A | Discharge status: Home / Self Care | Payer: BC Managed Care – PPO | Source: Ambulatory Visit | Attending: Family Medicine | Admitting: Family Medicine

## 2019-12-11 DIAGNOSIS — D1721 Benign lipomatous neoplasm of skin and subcutaneous tissue of right arm: Secondary | ICD-10-CM

## 2019-12-12 ENCOUNTER — Encounter: Payer: Self-pay | Admitting: Family Medicine

## 2019-12-14 ENCOUNTER — Other Ambulatory Visit: Payer: Self-pay | Admitting: "Endocrinology

## 2019-12-14 MED ORDER — THYROID 90 MG PO TABS: 90.0000 mg | ORAL_TABLET | Freq: Every day | ORAL | 1 refills | Status: DC

## 2020-01-04 ENCOUNTER — Encounter: Payer: Self-pay | Admitting: Family Medicine

## 2020-01-12 ENCOUNTER — Ambulatory Visit: Payer: BC Managed Care – PPO | Admitting: "Endocrinology

## 2020-01-29 ENCOUNTER — Other Ambulatory Visit: Payer: Self-pay | Admitting: Family Medicine

## 2020-02-02 ENCOUNTER — Other Ambulatory Visit: Payer: Self-pay | Admitting: Family Medicine

## 2020-02-03 MED ORDER — AMPHETAMINE-DEXTROAMPHET ER 20 MG PO CP24
20.0000 mg | ORAL_CAPSULE | Freq: Every day | ORAL | 0 refills | Status: DC
Start: 2020-02-03 — End: 2020-03-08

## 2020-02-03 NOTE — Telephone Encounter (Signed)
Ok to refill??  Last office visit 12/07/2019.  Last refill 01/21/2019.

## 2020-02-04 ENCOUNTER — Telehealth: Payer: Self-pay | Admitting: *Deleted

## 2020-02-04 NOTE — Telephone Encounter (Signed)
Received request from pharmacy for PA on Adderall.   PA submitted. Dx: [place].Pan- ADD.   Received the following message: This request has been approved using information available on the patient's profile. [id number];FRTMYT:[id number];Review Type:Prior Auth;Coverage Start Date:01/05/2020;Coverage End Date:02/03/2021;

## 2020-02-08 ENCOUNTER — Other Ambulatory Visit: Payer: Self-pay | Admitting: Family Medicine

## 2020-02-22 ENCOUNTER — Other Ambulatory Visit: Payer: Self-pay | Admitting: Family Medicine

## 2020-03-08 ENCOUNTER — Other Ambulatory Visit: Payer: Self-pay | Admitting: Family Medicine

## 2020-03-08 DIAGNOSIS — F900 Attention-deficit hyperactivity disorder, predominantly inattentive type: Secondary | ICD-10-CM

## 2020-03-08 MED ORDER — AMPHETAMINE-DEXTROAMPHET ER 20 MG PO CP24: 20.0000 mg | ORAL_CAPSULE | Freq: Every day | ORAL | 0 refills | Status: DC

## 2020-03-08 NOTE — Telephone Encounter (Signed)
Ok to refill??  Last office visit 12/07/2019.  Last refill 09/11/2019, #1 refill.

## 2020-03-17 LAB — LIPID PANEL
Chol/HDL Ratio: 3.3 ratio (ref 0.0–4.4)
Cholesterol, Total: 245 mg/dL — ABNORMAL HIGH (ref 100–199)
HDL: 75 mg/dL (ref >39)
LDL Chol Calc (NIH): 142 mg/dL — ABNORMAL HIGH (ref 0–99)
Triglycerides: 157 mg/dL — ABNORMAL HIGH (ref 0–149)
VLDL Cholesterol Cal: 28 mg/dL (ref 5–40)

## 2020-03-17 LAB — T4, FREE: Free T4: 0.7 ng/dL — ABNORMAL LOW (ref 0.82–1.77)

## 2020-03-17 LAB — TSH: TSH: 22.8 u[IU]/mL — ABNORMAL HIGH (ref 0.450–4.500)

## 2020-03-21 ENCOUNTER — Telehealth: Payer: Self-pay

## 2020-03-21 NOTE — Telephone Encounter (Signed)
If patient calls back, she needs to move her appt up.

## 2020-03-23 ENCOUNTER — Telehealth: Payer: Self-pay | Admitting: "Endocrinology

## 2020-03-23 ENCOUNTER — Other Ambulatory Visit: Payer: Self-pay

## 2020-03-23 ENCOUNTER — Ambulatory Visit (INDEPENDENT_AMBULATORY_CARE_PROVIDER_SITE_OTHER): Payer: No Typology Code available for payment source | Admitting: "Endocrinology

## 2020-03-23 ENCOUNTER — Encounter: Payer: Self-pay | Admitting: "Endocrinology

## 2020-03-23 VITALS — BP 126/80 | HR 74 | Ht 67.0 in | Wt 254.2 lb

## 2020-03-23 DIAGNOSIS — E890 Postprocedural hypothyroidism: Secondary | ICD-10-CM

## 2020-03-23 DIAGNOSIS — Z85850 Personal history of malignant neoplasm of thyroid: Secondary | ICD-10-CM

## 2020-03-23 MED ORDER — SYNTHROID 150 MCG PO TABS: 150.0000 ug | ORAL_TABLET | Freq: Every day | ORAL | 1 refills | Status: DC

## 2020-03-23 NOTE — Progress Notes (Signed)
03/23/2020, 12:52 PM                  Endocrinology follow-up note   Crystal Waters is a 65 y.o.-year-old female patient being seen in follow-up for postsurgical hypothyroidism, history of thyroid malignancy.   PMD:  Crystal Rossetti, MD.   Past Medical History:  Diagnosis Date  . Allergy    gluten, seasonal  . Anxiety   . Arthritis   . Asthma    seasonal   . Cancer ([place])    Thyroid  . Complication of anesthesia   . Constipation   . Depression   . GERD (gastroesophageal reflux disease)   . Gestational diabetes mellitus    with 1 child.    . H/O nose injury    accidently hit in the nose, has a stuffy nose, can't lie flat, wears nasal strips at night  . History of kidney stones      x 2 passed  . Hypertension   . Hypothyroidism   . PONV (postoperative nausea and vomiting)   . PTSD (post-traumatic stress disorder)   . Thyroid cancer ([place])   . Thyroid disease    Past Surgical History:  Procedure Laterality Date  . CHOLECYSTECTOMY N/A 04/11/2016   Procedure: LAPAROSCOPIC CHOLECYSTECTOMY WITH INTRAOPERATIVE CHOLANGIOGRAM;  Surgeon: Georganna Skeans, MD;  Location: [place];  Service: General;  Laterality: N/A;  . COLONOSCOPY    . KNEE SURGERY Right 2009  . LAPAROTOMY N/A 04/17/2016   Procedure: LAPAROTOMY REPAIR OF INCARCERATED INCISIONAL HERNIA WITH VAC PLACEMENT;  Surgeon: Rolm Bookbinder, MD;  Location: [place];  Service: General;  Laterality: N/A;  . THYROID SURGERY     2001  . TUBAL LIGATION     Social History   Socioeconomic History  . Marital status: Divorced    Spouse name: Not on file  . Number of children: 2  . Years of education: Not on file  . Highest education level: Not on file  Occupational History  . Occupation: lead teller  Tobacco Use  . Smoking status: Former Research scientist (life sciences)  . Smokeless tobacco: Never Used  . Tobacco comment: social smoker- never a habit  Vaping Use  . Vaping  Use: Never used  Substance and Sexual Activity  . Alcohol use: Yes    Comment: occasionally   . Drug use: No  . Sexual activity: Not Currently  Other Topics Concern  . Not on file  Social History Narrative  . Not on file   Social Determinants of Health   Financial Resource Strain: Not on file  Food Insecurity: Not on file  Transportation Needs: Not on file  Physical Activity: Not on file  Stress: Not on file  Social Connections: Not on file   Outpatient Encounter Medications as of 03/23/2020  Medication Sig  . SYNTHROID 150 MCG tablet Take 1 tablet (150 mcg total) by mouth daily before breakfast.  . acetaminophen (TYLENOL) 500 MG tablet Take 500 mg every 8 (eight) hours as needed by mouth.  . ALPRAZolam (XANAX) 0.5 MG tablet TAKE 1 TABLET BY  MOUTH TWICE A DAY AS NEEDED FOR ANXIETY  . amphetamine-dextroamphetamine (ADDERALL XR) 20 MG 24 hr capsule Take 1 capsule (20 mg total) by mouth daily.  Marland Kitchen. buPROPion (WELLBUTRIN XL) 300 MG 24 hr tablet TAKE 1 TABLET BY MOUTH EVERY DAY  . Cholecalciferol (VITAMIN D) 2000 UNITS tablet Take 2,000 Units by mouth daily.  . clotrimazole-betamethasone (LOTRISONE) cream Apply 1 application topically 2 (two) times daily as needed.  . diclofenac Sodium (VOLTAREN) 1 % GEL APPLY AS IDRECTED  . ibuprofen (ADVIL,MOTRIN) 200 MG tablet Take 400 mg by mouth every 8 (eight) hours as needed for mild pain or moderate pain.  Marland Kitchen. lisinopril (ZESTRIL) 10 MG tablet TAKE 1 TABLET BY MOUTH EVERY DAY  . omeprazole (PRILOSEC) 40 MG capsule TAKE 1 CAPSULE BY MOUTH EVERY DAY  . ondansetron (ZOFRAN ODT) 4 MG disintegrating tablet Take 1 tablet (4 mg total) by mouth every 8 (eight) hours as needed for nausea or vomiting.  Marland Kitchen. OVER THE COUNTER MEDICATION Nasal spay qhs  . Probiotic Product (ALIGN) 4 MG CAPS Take 1 capsule by mouth daily.  . [DISCONTINUED] thyroid (ARMOUR THYROID) 90 MG tablet Take 1 tablet (90 mg total) by mouth daily before breakfast.   No facility-administered  encounter medications on file as of 03/23/2020.   ALLERGIES: Allergies  Allergen Reactions  . Gluten Meal Rash  . Levothyroxine Other (See Comments)    Rash, stomach "gnawing"  . Zoloft [Sertraline Hcl] Other (See Comments)    STOMACH ACHES   VACCINATION STATUS: Immunization History  Administered Date(s) Administered  . Influenza,inj,Quad PF,6+ Mos 11/22/2015, 03/05/2017, 04/07/2018, 11/04/2018, 12/07/2019  . PFIZER SARS-COV-2 Vaccination 06/02/2019, 06/24/2019    HPI   Crystal Waters  is a patient with the above medical history.  She is returning with repeat thyroid function tests for follow-up of  postsurgical hypothyroidism.  See notes from her prior visits.  During her last visit, she was left on Synthroid 150 mcg p.o. daily before breakfast.  In the interval, she has obtained Armour thyroid 90 mg p.o. daily.  Her previsit thyroid function tests are abnormal, indicating under replacement.  She has gained weight.  She reports fatigue.  She denies palpitations, tremor, nor heat/cold intolerance.   Her prior thyroid history is not available to review.  Her thyroid history starts in April 2001 when she underwent total thyroidectomy for thyroid malignancy.  She recalls being treated with radioactive iodine remnant ablation following her surgery.   In August 2019 she underwent thyroid/neck ultrasound which reveals no remnant thyroid tissue or masses or signs of recurrence in the thyroid bed/neck.   She denies family history of thyroid malignancy, however thyroid dysfunction in multiple family members including her mother and her siblings.  -She denies feeling nodules in neck, hoarseness, dysphagia/odynophagia, SOB with lying down.  No h/o radiation tx to head or neck.   I reviewed her chart and she also has a hypertension on treatment, GERD on Prilosec.    ROS: Limited as above.  Physical Exam: BP 126/80   Pulse 74   Ht 5\' 7"  (1.702 m)   Wt 254 lb 3.2 oz (115.3 kg)   BMI 39.81  kg/m  Wt Readings from Last 3 Encounters:  03/23/20 254 lb 3.2 oz (115.3 kg)  12/07/19 252 lb (114.3 kg)  10/12/19 (!) 247 lb 12.8 oz (112.4 kg)     Diabetic Labs (most recent): Lab Results  Component Value Date   HGBA1C 5.7 (H) 12/07/2019   HGBA1C 5.4 04/24/2017   HGBA1C  5.3 05/15/2016     Lipid Panel ( most recent) Lipid Panel     Component Value Date/Time   CHOL 245 (H) 03/16/2020 1334   TRIG 157 (H) 03/16/2020 1334   HDL 75 03/16/2020 1334   CHOLHDL 3.3 03/16/2020 1334   CHOLHDL 3.1 08/11/2019 0831   VLDL 34 (H) 05/15/2016 1143   LDLCALC 142 (H) 03/16/2020 1334   LDLCALC 123 (H) 08/11/2019 0831     Recent Results (from the past 2160 hour(s))  TSH     Status: Abnormal   Collection Time: 03/16/20  1:34 PM  Result Value Ref Range   TSH 22.800 (H) 0.450 - 4.500 uIU/mL  T4, free     Status: Abnormal   Collection Time: 03/16/20  1:34 PM  Result Value Ref Range   Free T4 0.70 (L) 0.82 - 1.77 ng/dL  Lipid panel     Status: Abnormal   Collection Time: 03/16/20  1:34 PM  Result Value Ref Range   Cholesterol, Total 245 (H) 100 - 199 mg/dL   Triglycerides 157 (H) 0 - 149 mg/dL   HDL 75 >39 mg/dL   VLDL Cholesterol Cal 28 5 - 40 mg/dL   LDL Chol Calc (NIH) 142 (H) 0 - 99 mg/dL   Chol/HDL Ratio 3.3 0.0 - 4.4 ratio    Comment:                                   T. Chol/HDL Ratio                                             Men  Women                               1/2 Avg.Risk  3.4    3.3                                   Avg.Risk  5.0    4.4                                2X Avg.Risk  9.6    7.1                                3X Avg.Risk 23.4   11.0       ASSESSMENT: 1. Postsurgical Hypothyroidism 2. History of thyroid malignancy  PLAN:    Patient with long-standing postsurgical hypothyroidism after thyroidectomy for thyroid cancer in April 2001.  -Her previsit thyroid function tests are consistent with inadequate replacement.  This is largely due to unreliable  thyroid assays for Armour Thyroid.  She is willing to convert back to Synthroid.  I discussed and reinitiated Synthroid at 150 mcg p.o. daily before breakfast.    - We discussed about the correct intake of her thyroid hormone, on empty stomach at fasting, with water, separated by at least 30 minutes from breakfast and other medications,  and separated by more than 4 hours from calcium, iron, multivitamins, acid reflux medications (PPIs). -Patient is made aware of the  fact that thyroid hormone replacement is needed for life, dose to be adjusted by periodic monitoring of thyroid function tests.   Per her description, her initial treatment for thyroid malignancy appears to be complete.    Her treatment was completed approximately 20 years ago-she is likely in remission.  Prior to her last visit, thyroid/neck surveillance ultrasound was negative for residual thyroid tissue nor recurrence.    She will not need further intervention for history of thyroid malignancy at this time.  She is advised to maintain close follow-up with her PMD      - Time spent on this patient care encounter:  20 minutes of which 50% was spent in  counseling and the rest reviewing  her current and  previous labs / studies and medications  doses and developing a plan for long term care. Crystal Coventry  participated in the discussions, expressed understanding, and voiced agreement with the above plans.  All questions were answered to her satisfaction. she is encouraged to contact clinic should she have any questions or concerns prior to her return visit.    Return in about 7 weeks (around 05/11/2020) for F/U with Pre-visit Labs.  Marquis Lunch, MD [facility] [address] [place], [place] [address] Phone: [phone]  Fax: [phone]   03/23/2020, 12:52 PM  This note was partially dictated with voice recognition software. Similar sounding words can be transcribed  inadequately or may not  be corrected upon review.

## 2020-03-23 NOTE — Telephone Encounter (Signed)
Pt is calling and states Synthroid needs a prior authorization.

## 2020-03-24 NOTE — Telephone Encounter (Signed)
Prior authorization request sent.

## 2020-03-28 NOTE — Telephone Encounter (Signed)
APPROVED 03/25/2020- 03/25/2021

## 2020-04-05 ENCOUNTER — Ambulatory Visit: Payer: BC Managed Care – PPO | Admitting: "Endocrinology

## 2020-04-06 ENCOUNTER — Ambulatory Visit: Payer: BC Managed Care – PPO | Admitting: "Endocrinology

## 2020-05-06 LAB — TSH: TSH: 4.21 u[IU]/mL (ref 0.450–4.500)

## 2020-05-06 LAB — T4, FREE: Free T4: 1.85 ng/dL — ABNORMAL HIGH (ref 0.82–1.77)

## 2020-05-11 ENCOUNTER — Encounter: Payer: Self-pay | Admitting: "Endocrinology

## 2020-05-11 ENCOUNTER — Other Ambulatory Visit: Payer: Self-pay

## 2020-05-11 ENCOUNTER — Ambulatory Visit (INDEPENDENT_AMBULATORY_CARE_PROVIDER_SITE_OTHER): Payer: No Typology Code available for payment source | Admitting: "Endocrinology

## 2020-05-11 ENCOUNTER — Telehealth: Payer: Self-pay

## 2020-05-11 VITALS — BP 116/58 | HR 76 | Ht 67.0 in | Wt 254.8 lb

## 2020-05-11 DIAGNOSIS — E890 Postprocedural hypothyroidism: Secondary | ICD-10-CM

## 2020-05-11 DIAGNOSIS — E782 Mixed hyperlipidemia: Secondary | ICD-10-CM | POA: Diagnosis not present

## 2020-05-11 DIAGNOSIS — Z85850 Personal history of malignant neoplasm of thyroid: Secondary | ICD-10-CM | POA: Diagnosis not present

## 2020-05-11 MED ORDER — SYNTHROID 137 MCG PO TABS: 137.0000 ug | ORAL_TABLET | Freq: Every day | ORAL | 1 refills | Status: DC

## 2020-05-11 MED ORDER — LISINOPRIL 5 MG PO TABS
10.0000 mg | ORAL_TABLET | Freq: Every day | ORAL | 1 refills | Status: DC
Start: 2020-05-11 — End: 2020-08-24

## 2020-05-11 MED ORDER — ROSUVASTATIN CALCIUM 10 MG PO TABS: 10.0000 mg | ORAL_TABLET | Freq: Every day | ORAL | 1 refills | Status: DC

## 2020-05-11 NOTE — Progress Notes (Signed)
Experiencing episodes of "brief" lightheadedness x 1 month.

## 2020-05-11 NOTE — Progress Notes (Signed)
05/11/2020, 11:11 AM                  Endocrinology follow-up note   Crystal Waters is a 65 y.o.-year-old female patient being seen in follow-up for postsurgical hypothyroidism, history of thyroid malignancy.   PMD:  Alycia Rossetti, MD.   Past Medical History:  Diagnosis Date  . Allergy    gluten, seasonal  . Anxiety   . Arthritis   . Asthma    seasonal   . Cancer ([employer])    Thyroid  . Complication of anesthesia   . Constipation   . Depression   . GERD (gastroesophageal reflux disease)   . Gestational diabetes mellitus    with 1 child.    . H/O nose injury    accidently hit in the nose, has a stuffy nose, can't lie flat, wears nasal strips at night  . History of kidney stones      x 2 passed  . Hypertension   . Hypothyroidism   . PONV (postoperative nausea and vomiting)   . PTSD (post-traumatic stress disorder)   . Thyroid cancer ([place])   . Thyroid disease    Past Surgical History:  Procedure Laterality Date  . CHOLECYSTECTOMY N/A 04/11/2016   Procedure: LAPAROSCOPIC CHOLECYSTECTOMY WITH INTRAOPERATIVE CHOLANGIOGRAM;  Surgeon: Georganna Skeans, MD;  Location: [facility];  Service: General;  Laterality: N/A;  . COLONOSCOPY    . KNEE SURGERY Right 2009  . LAPAROTOMY N/A 04/17/2016   Procedure: LAPAROTOMY REPAIR OF INCARCERATED INCISIONAL HERNIA WITH VAC PLACEMENT;  Surgeon: Rolm Bookbinder, MD;  Location: [facility];  Service: General;  Laterality: N/A;  . THYROID SURGERY     2001  . TUBAL LIGATION     Social History   Socioeconomic History  . Marital status: Divorced    Spouse name: Not on file  . Number of children: 2  . Years of education: Not on file  . Highest education level: Not on file  Occupational History  . Occupation: lead teller  Tobacco Use  . Smoking status: Former Research scientist (life sciences)  . Smokeless tobacco: Never Used  . Tobacco comment: social smoker- never a habit  Vaping Use  .  Vaping Use: Never used  Substance and Sexual Activity  . Alcohol use: Yes    Comment: occasionally   . Drug use: No  . Sexual activity: Not Currently  Other Topics Concern  . Not on file  Social History Narrative  . Not on file   Social Determinants of Health   Financial Resource Strain: Not on file  Food Insecurity: Not on file  Transportation Needs: Not on file  Physical Activity: Not on file  Stress: Not on file  Social Connections: Not on file   Outpatient Encounter Medications as of 05/11/2020  Medication Sig  . rosuvastatin (CRESTOR) 10 MG tablet Take 1 tablet (10 mg total) by mouth daily.  Marland Kitchen SYNTHROID 137 MCG tablet Take 1 tablet (137 mcg total) by mouth daily before breakfast.  . acetaminophen (TYLENOL) 500 MG tablet Take 500 mg every 8 (eight)  hours as needed by mouth.  . ALPRAZolam (XANAX) 0.5 MG tablet TAKE 1 TABLET BY MOUTH TWICE A DAY AS NEEDED FOR ANXIETY  . amphetamine-dextroamphetamine (ADDERALL XR) 20 MG 24 hr capsule Take 1 capsule (20 mg total) by mouth daily.  Marland Kitchen buPROPion (WELLBUTRIN XL) 300 MG 24 hr tablet TAKE 1 TABLET BY MOUTH EVERY DAY  . Cholecalciferol (VITAMIN D) 2000 UNITS tablet Take 2,000 Units by mouth daily.  . clotrimazole-betamethasone (LOTRISONE) cream Apply 1 application topically 2 (two) times daily as needed.  . diclofenac Sodium (VOLTAREN) 1 % GEL APPLY AS IDRECTED  . ibuprofen (ADVIL,MOTRIN) 200 MG tablet Take 400 mg by mouth every 8 (eight) hours as needed for mild pain or moderate pain.  Marland Kitchen lisinopril (ZESTRIL) 5 MG tablet Take 2 tablets (10 mg total) by mouth daily.  Marland Kitchen omeprazole (PRILOSEC) 40 MG capsule TAKE 1 CAPSULE BY MOUTH EVERY DAY  . ondansetron (ZOFRAN ODT) 4 MG disintegrating tablet Take 1 tablet (4 mg total) by mouth every 8 (eight) hours as needed for nausea or vomiting.  Marland Kitchen OVER THE COUNTER MEDICATION Nasal spay qhs  . Probiotic Product (ALIGN) 4 MG CAPS Take 1 capsule by mouth daily.  . [DISCONTINUED] lisinopril (ZESTRIL) 10 MG  tablet TAKE 1 TABLET BY MOUTH EVERY DAY  . [DISCONTINUED] SYNTHROID 150 MCG tablet Take 1 tablet (150 mcg total) by mouth daily before breakfast.   No facility-administered encounter medications on file as of 05/11/2020.   ALLERGIES: Allergies  Allergen Reactions  . Gluten Meal Rash  . Levothyroxine Other (See Comments)    Rash, stomach "gnawing"  . Zoloft [Sertraline Hcl] Other (See Comments)    STOMACH ACHES   VACCINATION STATUS: Immunization History  Administered Date(s) Administered  . Influenza,inj,Quad PF,6+ Mos 11/22/2015, 03/05/2017, 04/07/2018, 11/04/2018, 12/07/2019  . PFIZER(Purple Top)SARS-COV-2 Vaccination 06/02/2019, 06/24/2019    HPI   Crystal Waters  is a patient with the above medical history.  She is returning with repeat thyroid function tests for follow-up of  postsurgical hypothyroidism.  See notes from her prior visits. After her last visit, she remains on Synthroid 150 mcg p.o. daily before breakfast with much better consistency. She feels better. Her previsit labs are consistent with slight over replacement. She has a steady weight since last visit.    She denies palpitations, tremor, nor heat/cold intolerance.   Her prior thyroid history is not available to review.  Her thyroid history starts in April 2001 when she underwent total thyroidectomy for thyroid malignancy.  She recalls being treated with radioactive iodine remnant ablation following her surgery.   In August 2019 she underwent thyroid/neck ultrasound which reveals no remnant thyroid tissue or masses or signs of recurrence in the thyroid bed/neck.   She denies family history of thyroid malignancy, however thyroid dysfunction in multiple family members including her mother and her siblings.  -She denies feeling nodules in neck, hoarseness, dysphagia/odynophagia, SOB with lying down. She has severe dyslipidemia not on treatment.   I reviewed her chart and she also has a hypertension on treatment, GERD  on Prilosec.    ROS: Limited as above.  Physical Exam: BP (!) 116/58   Pulse 76   Ht 5\' 7"  (1.702 m)   Wt 254 lb 12.8 oz (115.6 kg)   BMI 39.91 kg/m  Wt Readings from Last 3 Encounters:  05/11/20 254 lb 12.8 oz (115.6 kg)  03/23/20 254 lb 3.2 oz (115.3 kg)  12/07/19 252 lb (114.3 kg)     Diabetic Labs (most recent):  Lab Results  Component Value Date   HGBA1C 5.7 (H) 12/07/2019   HGBA1C 5.4 04/24/2017   HGBA1C 5.3 05/15/2016     Lipid Panel ( most recent) Lipid Panel     Component Value Date/Time   CHOL 245 (H) 03/16/2020 1334   TRIG 157 (H) 03/16/2020 1334   HDL 75 03/16/2020 1334   CHOLHDL 3.3 03/16/2020 1334   CHOLHDL 3.1 08/11/2019 0831   VLDL 34 (H) 05/15/2016 1143   LDLCALC 142 (H) 03/16/2020 1334   LDLCALC 123 (H) 08/11/2019 0831     Recent Results (from the past 2160 hour(s))  TSH     Status: Abnormal   Collection Time: 03/16/20  1:34 PM  Result Value Ref Range   TSH 22.800 (H) 0.450 - 4.500 uIU/mL  T4, free     Status: Abnormal   Collection Time: 03/16/20  1:34 PM  Result Value Ref Range   Free T4 0.70 (L) 0.82 - 1.77 ng/dL  Lipid panel     Status: Abnormal   Collection Time: 03/16/20  1:34 PM  Result Value Ref Range   Cholesterol, Total 245 (H) 100 - 199 mg/dL   Triglycerides 157 (H) 0 - 149 mg/dL   HDL 75 >39 mg/dL   VLDL Cholesterol Cal 28 5 - 40 mg/dL   LDL Chol Calc (NIH) 142 (H) 0 - 99 mg/dL   Chol/HDL Ratio 3.3 0.0 - 4.4 ratio    Comment:                                   T. Chol/HDL Ratio                                             Men  Women                               1/2 Avg.Risk  3.4    3.3                                   Avg.Risk  5.0    4.4                                2X Avg.Risk  9.6    7.1                                3X Avg.Risk 23.4   11.0   TSH     Status: None   Collection Time: 05/05/20 11:53 AM  Result Value Ref Range   TSH 4.210 0.450 - 4.500 uIU/mL  T4, free     Status: Abnormal   Collection Time: 05/05/20  11:53 AM  Result Value Ref Range   Free T4 1.85 (H) 0.82 - 1.77 ng/dL      ASSESSMENT: 1. Postsurgical Hypothyroidism 2. History of thyroid malignancy  PLAN:    Patient with long-standing postsurgical hypothyroidism after thyroidectomy for thyroid cancer in April 2001.  -Her previsit thyroid function tests are consistent with slight over replacement. I discussed and lowered her Synthroid to 137  mcg p.o. daily before breakfast.  . - We discussed about the correct intake of her thyroid hormone, on empty stomach at fasting, with water, separated by at least 30 minutes from breakfast and other medications,  and separated by more than 4 hours from calcium, iron, multivitamins, acid reflux medications (PPIs). -Patient is made aware of the fact that thyroid hormone replacement is needed for life, dose to be adjusted by periodic monitoring of thyroid function tests.   Per her description, her initial treatment for thyroid malignancy appears to be complete.    Her treatment was completed approximately 20 years ago-she is likely in remission.  Prior to her last visit, thyroid/neck surveillance ultrasound was negative for residual thyroid tissue nor recurrence.   She will not need further intervention for history of thyroid malignancy at this time.  -Regarding her severe chronic dyslipidemia, she was approached with statin intervention. She accepted Crestor with reluctance. I discussed and prescribed Crestor 10 mg p.o. nightly. She will need another fasting lipid panel before her next visit in 6 months.   She is advised to maintain close follow-up with her PMD      - Time spent on this patient care encounter:  30 minutes of which 50% was spent in  counseling and the rest reviewing  her current and  previous labs / studies and medications  doses and developing a plan for long term care, and documenting this care. Orson Gear  participated in the discussions, expressed understanding, and  voiced agreement with the above plans.  All questions were answered to her satisfaction. she is encouraged to contact clinic should she have any questions or concerns prior to her return visit.   Return in about 6 months (around 11/08/2020) for Fasting Labs  in AM B4 8.  Glade Lloyd, MD [facility] [address] [place], [place] [address] Phone: [phone]  Fax: [phone]   05/11/2020, 11:11 AM  This note was partially dictated with voice recognition software. Similar sounding words can be transcribed inadequately or may not  be corrected upon review.

## 2020-05-16 ENCOUNTER — Other Ambulatory Visit: Payer: Self-pay

## 2020-05-16 DIAGNOSIS — E890 Postprocedural hypothyroidism: Secondary | ICD-10-CM

## 2020-05-16 MED ORDER — LEVOXYL 137 MCG PO TABS: 137.0000 ug | ORAL_TABLET | Freq: Every day | ORAL | 1 refills | Status: DC

## 2020-05-17 ENCOUNTER — Other Ambulatory Visit: Payer: Self-pay

## 2020-05-17 DIAGNOSIS — E890 Postprocedural hypothyroidism: Secondary | ICD-10-CM

## 2020-05-17 MED ORDER — SYNTHROID 137 MCG PO TABS: 137.0000 ug | ORAL_TABLET | Freq: Every day | ORAL | 1 refills | Status: DC

## 2020-05-18 ENCOUNTER — Other Ambulatory Visit: Payer: Self-pay

## 2020-05-18 ENCOUNTER — Ambulatory Visit (INDEPENDENT_AMBULATORY_CARE_PROVIDER_SITE_OTHER): Payer: No Typology Code available for payment source | Admitting: Nurse Practitioner

## 2020-05-18 VITALS — Temp 98.0°F | Ht 67.0 in | Wt 254.8 lb

## 2020-05-18 DIAGNOSIS — E782 Mixed hyperlipidemia: Secondary | ICD-10-CM

## 2020-05-18 DIAGNOSIS — R42 Dizziness and giddiness: Secondary | ICD-10-CM | POA: Diagnosis not present

## 2020-05-18 NOTE — Progress Notes (Signed)
Subjective:    Patient ID: Crystal Waters, female    DOB: [birth year], 65 y.o.   MRN: [id number]  HPI: Crystal Waters is a 65 y.o. female presenting for  Chief Complaint  Patient presents with  . Dizziness    Told to take lisinopril every other day, took a who pill this morning. Has been having dizziness for a month now. Ortho bo cmpleted   DIZZINESS Duration: 1 month Description of symptoms: lightheaded Duration of episode: Dizziness frequency: no history of the same Aggravating/provoking factors:   Triggered by rolling over in bed: yes Triggered by bending over: yes Aggravated by head movement: yes Aggravated by exertion: no Aggravated by coughing: no Aggravated by loud noises: no Recent head injury: no Recent or current viral symptoms: no History of vasovagal episodes: no; ever since gall bladder out, make her nauseous (2017) Nausea: no Vomiting: no Tinnitus: no Hearing loss: no Aural fullness: no Headache: no Photophobia: no Phonophobia: no Unsteady gait: yes Postural instability: no Diplopia: no Dysarthria: no Dysphagia: no  Weakness: no Related to exertion: no Pallor: no Diaphoresis: no Dyspnea: no Chest pain: no   BP at home has been "all over the place."  120/60 - 148/90.   Has been taking 5 mg of lisinopril since last week.  Does not "drink as much water as I should."  Reports her endocrinologist recently started her on Crestor and is wondering if she "really needs to take this." The 10-year ASCVD risk score Crystal Waters) is: 5.4%   Values used to calculate the score:     Age: 58 years     Sex: Female     Is Non-Hispanic African American: No     Diabetic: No     Tobacco smoker: No     Systolic Blood Pressure: 694 mmHg     Is BP treated: Yes     HDL Cholesterol: 75 mg/dL     Total Cholesterol: 245 mg/dL  Allergies  Allergen Reactions  . Gluten Meal Rash  . Levothyroxine Other (See Comments)    Rash, stomach "gnawing"  .  Zoloft [Sertraline Hcl] Other (See Comments)    STOMACH ACHES    Outpatient Encounter Medications as of 05/18/2020  Medication Sig  . acetaminophen (TYLENOL) 500 MG tablet Take 500 mg every 8 (eight) hours as needed by mouth.  . ALPRAZolam (XANAX) 0.5 MG tablet TAKE 1 TABLET BY MOUTH TWICE A DAY AS NEEDED FOR ANXIETY  . amphetamine-dextroamphetamine (ADDERALL XR) 20 MG 24 hr capsule Take 1 capsule (20 mg total) by mouth daily.  Marland Kitchen buPROPion (WELLBUTRIN XL) 300 MG 24 hr tablet TAKE 1 TABLET BY MOUTH EVERY DAY  . Cholecalciferol (VITAMIN D) 2000 UNITS tablet Take 2,000 Units by mouth daily.  . clotrimazole-betamethasone (LOTRISONE) cream Apply 1 application topically 2 (two) times daily as needed.  . diclofenac Sodium (VOLTAREN) 1 % GEL APPLY AS IDRECTED  . ibuprofen (ADVIL,MOTRIN) 200 MG tablet Take 400 mg by mouth every 8 (eight) hours as needed for mild pain or moderate pain.  Marland Kitchen lisinopril (ZESTRIL) 5 MG tablet Take 2 tablets (10 mg total) by mouth daily.  Marland Kitchen omeprazole (PRILOSEC) 40 MG capsule TAKE 1 CAPSULE BY MOUTH EVERY DAY  . ondansetron (ZOFRAN ODT) 4 MG disintegrating tablet Take 1 tablet (4 mg total) by mouth every 8 (eight) hours as needed for nausea or vomiting.  Marland Kitchen OVER THE COUNTER MEDICATION Nasal spay qhs  . Probiotic Product (ALIGN) 4 MG CAPS Take 1  capsule by mouth daily.  . rosuvastatin (CRESTOR) 10 MG tablet Take 1 tablet (10 mg total) by mouth daily.  Marland Kitchen SYNTHROID 137 MCG tablet Take 1 tablet (137 mcg total) by mouth daily before breakfast.   No facility-administered encounter medications on file as of 05/18/2020.    Patient Active Problem List   Diagnosis Date Noted  . Mixed hyperlipidemia 05/11/2020  . Stress at work 11/11/2018  . Candidiasis of skin 09/12/2018  . ADD (attention deficit disorder) 05/06/2018  . Postsurgical hypothyroidism 09/18/2017  . History of thyroid cancer 09/18/2017  . GERD (gastroesophageal reflux disease) 12/24/2016  . Incarcerated incisional  hernia 04/18/2016  . Left shoulder pain 10/03/2015  . Glucose intolerance (impaired glucose tolerance) 02/07/2015  . Atypical nevi 10/29/2013  . OA (osteoarthritis) of knee 01/15/2013  . Class 2 obesity 01/15/2013  . Essential hypertension, benign 01/15/2013  . Hypothyroidism 01/15/2013  . Chronic insomnia 01/15/2013  . Major depressive disorder 01/15/2013  . GAD (generalized anxiety disorder) 01/15/2013    Past Medical History:  Diagnosis Date  . Allergy    gluten, seasonal  . Anxiety   . Arthritis   . Asthma    seasonal   . Cancer (Crystal Waters)    Thyroid  . Complication of anesthesia   . Constipation   . Depression   . GERD (gastroesophageal reflux disease)   . Gestational diabetes mellitus    with 1 child.    . H/O nose injury    accidently hit in the nose, has a stuffy nose, can't lie flat, wears nasal strips at night  . History of kidney stones      x 2 passed  . Hypertension   . Hypothyroidism   . PONV (postoperative nausea and vomiting)   . PTSD (post-traumatic stress disorder)   . Thyroid cancer (Crystal Waters)   . Thyroid disease     Relevant past medical, surgical, family and social history reviewed and updated as indicated. Interim medical history since our last visit reviewed.  Review of Systems  Per HPI unless specifically indicated above     Objective:    Temp 98 F (36.7 C)   Ht 5\' 7"  (1.702 m)   Wt 254 lb 12.8 oz (115.6 kg)   SpO2 96%   BMI 39.91 kg/m   Lying BP: 144/78     HR 74 Sitting BP: 130/72   HR 87 Standing BP: 120/82    HR 80 Wt Readings from Last 3 Encounters:  05/18/20 254 lb 12.8 oz (115.6 kg)  05/11/20 254 lb 12.8 oz (115.6 kg)  03/23/20 254 lb 3.2 oz (115.3 kg)    Physical Exam Vitals and nursing note reviewed.  Constitutional:      General: She is not in acute distress.    Appearance: Normal appearance. She is not toxic-appearing.  HENT:     Head: Normocephalic and atraumatic.     Right Ear: Tympanic membrane, ear canal and  external ear normal.     Left Ear: Tympanic membrane, ear canal and external ear normal.     Nose: Nose normal. No congestion or rhinorrhea.     Mouth/Throat:     Mouth: Mucous membranes are moist.     Pharynx: Oropharynx is clear. No oropharyngeal exudate or posterior oropharyngeal erythema.  Eyes:     General: No scleral icterus.    Extraocular Movements: Extraocular movements intact.     Pupils: Pupils are equal, round, and reactive to light.  Neck:     Vascular: No carotid  bruit.  Cardiovascular:     Rate and Rhythm: Normal rate and regular rhythm.     Heart sounds: Normal heart sounds. No murmur heard.   Pulmonary:     Effort: Pulmonary effort is normal. No respiratory distress.     Breath sounds: Normal breath sounds. No wheezing, rhonchi or rales.  Musculoskeletal:     Cervical back: Normal range of motion and neck supple. No tenderness.  Lymphadenopathy:     Cervical: No cervical adenopathy.  Skin:    General: Skin is warm and dry.     Capillary Refill: Capillary refill takes less than 2 seconds.     Coloration: Skin is not jaundiced or pale.     Findings: No erythema.  Neurological:     General: No focal deficit present.     Mental Status: She is alert and oriented to person, place, and time.     Cranial Nerves: No cranial nerve deficit.     Sensory: No sensory deficit.     Motor: No weakness.     Coordination: Coordination normal.     Gait: Gait normal.  Psychiatric:        Mood and Affect: Mood normal.        Behavior: Behavior normal.        Thought Content: Thought content normal.        Judgment: Judgment normal.     Results for orders placed or performed in visit on 03/23/20  TSH  Result Value Ref Range   TSH 4.210 0.450 - 4.500 uIU/mL  T4, free  Result Value Ref Range   Free T4 1.85 (H) 0.82 - 1.77 ng/dL      Assessment & Plan:  1. Dizziness Acute.  Given > 20 SBP drop in office from sitting to standing and most often occurs with changes in  position, likely orthostatic hypotension.  No red flags in history or on examination today.  Encouraged increasing water intake - at least 64 oz per day.  Will check blood counts to ensure not amenic and kidney function, liver enzymes, and electrolytes.  Follow up pending blood work and if symptoms persist despite increasing water intake.  Question if over treating BP with lisinopril - may consider cutting back to 2.5 vs. Changing to alternative.  - COMPLETE METABOLIC PANEL WITH GFR - CBC with Differential  2. Mixed hyperlipidemia Discussed most recent lipid results at endocrine office and her ASCVD risk.  Although her risk is low, given her family history, I would recommend she start a statin to help lower LDL cholesterol.      Follow up plan: Return if symptoms worsen or fail to improve.

## 2020-05-18 NOTE — Patient Instructions (Addendum)
F/u as needed  Goal for water intake is 64 oz. Daily.  Be sure to take lisinopril 5 mg daily and let us know if your BP at home is consistently less than 120/80.

## 2020-05-18 NOTE — Telephone Encounter (Signed)
error 

## 2020-05-19 LAB — COMPLETE METABOLIC PANEL WITH GFR
AG Ratio: 1.7 (calc) (ref 1.0–2.5)
ALT: 13 U/L (ref 6–29)
AST: 12 U/L (ref 10–35)
Albumin: 4 g/dL (ref 3.6–5.1)
Alkaline phosphatase (APISO): 59 U/L (ref 37–153)
BUN: 18 mg/dL (ref 7–25)
CO2: 29 mmol/L (ref 20–32)
Calcium: 9.4 mg/dL (ref 8.6–10.4)
Chloride: 105 mmol/L (ref 98–110)
Creat: 0.98 mg/dL (ref 0.50–0.99)
GFR, Est African American: 71 mL/min/{1.73_m2} (ref >=60)
GFR, Est Non African American: 61 mL/min/{1.73_m2} (ref >=60)
Globulin: 2.3 g/dL (calc) (ref 1.9–3.7)
Glucose, Bld: 91 mg/dL (ref 65–99)
Potassium: 5.2 mmol/L (ref 3.5–5.3)
Sodium: 140 mmol/L (ref 135–146)
Total Bilirubin: 0.4 mg/dL (ref 0.2–1.2)
Total Protein: 6.3 g/dL (ref 6.1–8.1)

## 2020-05-19 LAB — CBC WITH DIFFERENTIAL/PLATELET
Absolute Monocytes: 510 cells/uL (ref 200–950)
Basophils Absolute: 19 cells/uL (ref 0–200)
Basophils Relative: 0.3 %
Eosinophils Absolute: 69 cells/uL (ref 15–500)
Eosinophils Relative: 1.1 %
HCT: 41.4 % (ref 35.0–45.0)
Hemoglobin: 13.3 g/dL (ref 11.7–15.5)
Lymphs Abs: 1065 cells/uL (ref 850–3900)
MCH: 28.7 pg (ref 27.0–33.0)
MCHC: 32.1 g/dL (ref 32.0–36.0)
MCV: 89.4 fL (ref 80.0–100.0)
MPV: 11.9 fL (ref 7.5–12.5)
Monocytes Relative: 8.1 %
Neutro Abs: 4637 cells/uL (ref 1500–7800)
Neutrophils Relative %: 73.6 %
Platelets: 197 10*3/uL (ref 140–400)
RBC: 4.63 10*6/uL (ref 3.80–5.10)
RDW: 13.5 % (ref 11.0–15.0)
Total Lymphocyte: 16.9 %
WBC: 6.3 10*3/uL (ref 3.8–10.8)

## 2020-05-28 ENCOUNTER — Other Ambulatory Visit: Payer: Self-pay | Admitting: Family Medicine

## 2020-06-01 ENCOUNTER — Other Ambulatory Visit: Payer: Self-pay | Admitting: "Endocrinology

## 2020-06-16 ENCOUNTER — Telehealth: Payer: Self-pay | Admitting: Adult Health

## 2020-06-16 NOTE — Telephone Encounter (Signed)
R/s appointment due to NP being out of office.

## 2020-08-02 ENCOUNTER — Telehealth: Payer: BC Managed Care – PPO | Admitting: Adult Health

## 2020-08-16 ENCOUNTER — Telehealth: Payer: No Typology Code available for payment source | Admitting: Adult Health

## 2020-08-17 ENCOUNTER — Other Ambulatory Visit: Payer: No Typology Code available for payment source

## 2020-08-17 ENCOUNTER — Other Ambulatory Visit: Payer: Self-pay

## 2020-08-17 DIAGNOSIS — R7302 Impaired glucose tolerance (oral): Secondary | ICD-10-CM

## 2020-08-17 DIAGNOSIS — E038 Other specified hypothyroidism: Secondary | ICD-10-CM

## 2020-08-17 DIAGNOSIS — E782 Mixed hyperlipidemia: Secondary | ICD-10-CM

## 2020-08-18 LAB — CBC WITH DIFFERENTIAL/PLATELET
Absolute Monocytes: 503 cells/uL (ref 200–950)
Basophils Absolute: 41 cells/uL (ref 0–200)
Basophils Relative: 0.6 %
Eosinophils Absolute: 143 cells/uL (ref 15–500)
Eosinophils Relative: 2.1 %
HCT: 41.8 % (ref 35.0–45.0)
Hemoglobin: 13.4 g/dL (ref 11.7–15.5)
Lymphs Abs: 1299 cells/uL (ref 850–3900)
MCH: 28.7 pg (ref 27.0–33.0)
MCHC: 32.1 g/dL (ref 32.0–36.0)
MCV: 89.5 fL (ref 80.0–100.0)
MPV: 11.9 fL (ref 7.5–12.5)
Monocytes Relative: 7.4 %
Neutro Abs: 4814 cells/uL (ref 1500–7800)
Neutrophils Relative %: 70.8 %
Platelets: 215 10*3/uL (ref 140–400)
RBC: 4.67 10*6/uL (ref 3.80–5.10)
RDW: 13.6 % (ref 11.0–15.0)
Total Lymphocyte: 19.1 %
WBC: 6.8 10*3/uL (ref 3.8–10.8)

## 2020-08-18 LAB — COMPLETE METABOLIC PANEL WITH GFR
AG Ratio: 1.7 (calc) (ref 1.0–2.5)
ALT: 11 U/L (ref 6–29)
AST: 14 U/L (ref 10–35)
Albumin: 4.1 g/dL (ref 3.6–5.1)
Alkaline phosphatase (APISO): 62 U/L (ref 37–153)
BUN/Creatinine Ratio: 17 (calc) (ref 6–22)
BUN: 20 mg/dL (ref 7–25)
CO2: 24 mmol/L (ref 20–32)
Calcium: 9 mg/dL (ref 8.6–10.4)
Chloride: 106 mmol/L (ref 98–110)
Creat: 1.17 mg/dL — ABNORMAL HIGH (ref 0.50–0.99)
GFR, Est African American: 57 mL/min/{1.73_m2} — ABNORMAL LOW (ref >=60)
GFR, Est Non African American: 49 mL/min/{1.73_m2} — ABNORMAL LOW (ref >=60)
Globulin: 2.4 g/dL (calc) (ref 1.9–3.7)
Glucose, Bld: 107 mg/dL — ABNORMAL HIGH (ref 65–99)
Potassium: 4.6 mmol/L (ref 3.5–5.3)
Sodium: 142 mmol/L (ref 135–146)
Total Bilirubin: 0.4 mg/dL (ref 0.2–1.2)
Total Protein: 6.5 g/dL (ref 6.1–8.1)

## 2020-08-18 LAB — LIPID PANEL
Cholesterol: 192 mg/dL (ref <200)
HDL: 77 mg/dL (ref >=50)
LDL Cholesterol (Calc): 93 mg/dL (calc)
Non-HDL Cholesterol (Calc): 115 mg/dL (calc) (ref <130)
Total CHOL/HDL Ratio: 2.5 (calc) (ref <5.0)
Triglycerides: 122 mg/dL (ref <150)

## 2020-08-18 LAB — HEMOGLOBIN A1C
Hgb A1c MFr Bld: 5.7 % of total Hgb — ABNORMAL HIGH (ref <5.7)
Mean Plasma Glucose: 117 mg/dL
eAG (mmol/L): 6.5 mmol/L

## 2020-08-18 LAB — TSH: TSH: 6.52 mIU/L — ABNORMAL HIGH (ref 0.40–4.50)

## 2020-08-24 ENCOUNTER — Other Ambulatory Visit: Payer: Self-pay

## 2020-08-24 ENCOUNTER — Encounter: Payer: Self-pay | Admitting: Nurse Practitioner

## 2020-08-24 ENCOUNTER — Ambulatory Visit (INDEPENDENT_AMBULATORY_CARE_PROVIDER_SITE_OTHER): Payer: No Typology Code available for payment source | Admitting: Nurse Practitioner

## 2020-08-24 VITALS — BP 144/74 | HR 97 | Temp 99.2°F | Ht 67.0 in | Wt 259.0 lb

## 2020-08-24 DIAGNOSIS — R7303 Prediabetes: Secondary | ICD-10-CM

## 2020-08-24 DIAGNOSIS — Z0001 Encounter for general adult medical examination with abnormal findings: Secondary | ICD-10-CM | POA: Diagnosis not present

## 2020-08-24 DIAGNOSIS — I10 Essential (primary) hypertension: Secondary | ICD-10-CM | POA: Diagnosis not present

## 2020-08-24 DIAGNOSIS — Z0000 Encounter for general adult medical examination without abnormal findings: Secondary | ICD-10-CM

## 2020-08-24 DIAGNOSIS — Z1231 Encounter for screening mammogram for malignant neoplasm of breast: Secondary | ICD-10-CM

## 2020-08-24 DIAGNOSIS — Z1211 Encounter for screening for malignant neoplasm of colon: Secondary | ICD-10-CM | POA: Diagnosis not present

## 2020-08-24 DIAGNOSIS — J069 Acute upper respiratory infection, unspecified: Secondary | ICD-10-CM

## 2020-08-24 DIAGNOSIS — Z79899 Other long term (current) drug therapy: Secondary | ICD-10-CM

## 2020-08-24 DIAGNOSIS — F411 Generalized anxiety disorder: Secondary | ICD-10-CM | POA: Diagnosis not present

## 2020-08-24 DIAGNOSIS — E782 Mixed hyperlipidemia: Secondary | ICD-10-CM | POA: Diagnosis not present

## 2020-08-24 DIAGNOSIS — N289 Disorder of kidney and ureter, unspecified: Secondary | ICD-10-CM

## 2020-08-24 DIAGNOSIS — E890 Postprocedural hypothyroidism: Secondary | ICD-10-CM

## 2020-08-24 DIAGNOSIS — F3342 Major depressive disorder, recurrent, in full remission: Secondary | ICD-10-CM

## 2020-08-24 MED ORDER — LISINOPRIL 5 MG PO TABS: 5.0000 mg | ORAL_TABLET | Freq: Every day | ORAL | 1 refills | Status: DC

## 2020-08-24 MED ORDER — ROSUVASTATIN CALCIUM 10 MG PO TABS: 5.0000 mg | ORAL_TABLET | Freq: Every day | ORAL | 1 refills | Status: AC

## 2020-08-24 NOTE — Progress Notes (Signed)
BP (!) 144/74   Pulse 97   Temp 99.2 F (37.3 C)   Ht 5\' 7"  (1.702 m)   Wt 259 lb (117.5 kg)   SpO2 94%   BMI 40.57 kg/m    Subjective:    Patient ID: Crystal Waters, female    DOB: [birth year], 65 y.o.   MRN: [id number]  HPI: Crystal Waters is a 65 y.o. female presenting on 08/24/2020 for comprehensive medical examination. Current medical complaints include:  UPPER RESPIRATORY TRACT INFECTION Onset: Monday COVID-19 testing history:tested negative multiple times since Monday COVID-19 vaccination status: 2 vaccines with 1 booster Fever:  yes; highest 102 Cough: yes; dry Shortness of breath: no Wheezing: no Chest pain: no Chest tightness: no Chest congestion: no Nasal congestion: no Runny nose: yes Post nasal drip: yes Sneezing: no Sore throat: no Swollen glands: yes Sinus pressure: no Headache: no Face pain: no Toothache: no Ear pain: no  Ear pressure: yes   Eyes red/itching:no Eye drainage/crusting: no  Nausea:  yes; has chronic nausea   Vomiting: no Diarrhea: no  Change in appetite:  ues; decreased since Monday   Loss of taste/smell: no  Rash: no Fatigue: no Sick contacts: yes; sister and nephew sick, everyone negative for COVID-19 so far Strep contacts: no  Context: stable Recurrent sinusitis: no Treatments attempted: Tyneol  Relief with OTC medications: yes  Taking Adderall XR 20 mg as needed - less than once per week to help with focus when she is going through a particularly rough day.  Also taking Alprazolam 0.5 mg sparingly, about twice per week, for insomnia, usually only when she wakes up and cannot fall asleep.  She does report nocturia.  Reports her sleep has been "rough" lately.  Does have sleep apnea and wears CPAP more than 50% of the time.    Talked with counselor during [place] pandemic weekly and felt this was beneficial.  Reports increased stress lately and is wondering if she can get back in with counselor.  At home, blood pressure has  consistently been less than 440 systolic.  She has her blood pressure cuff with her today.  It is a wrist cough.  The dizziness improved greatly when we decreased lisinopril to 5 mg.    She is requesting referral to new Endocrinologist today.  She is wondering if her thyroid medication needs to be adjusted.  She is not able to take levothyroxine.  She currently lives with: [name] (son) Postmenopausal - 15 years ago  Depression Screen done today and results listed below:  Depression screen [facility] 2/9 08/24/2020 12/07/2019 05/11/2019 01/21/2019 12/09/2018  Decreased Interest 0 0 0 0 1  Down, Depressed, Hopeless 0 0 0 0 1  PHQ - 2 Score 0 0 0 0 2  Altered sleeping 3 2 3 3 3   Tired, decreased energy 3 1 1 1 1   Change in appetite 1 0 0 0 1  Feeling bad or failure about yourself  0 0 0 0 1  Trouble concentrating 1 2 1 1  0  Moving slowly or fidgety/restless 0 0 0 0 0  Suicidal thoughts 0 0 0 0 0  PHQ-9 Score 8 5 5 5 8   Difficult doing work/chores Somewhat difficult Not difficult at all Somewhat difficult Not difficult at all Somewhat difficult  Some recent data might be hidden   GAD 7 : Generalized Anxiety Score 08/24/2020 12/09/2018 10/14/2018  Nervous, Anxious, on Edge 1 2 2   Control/stop worrying 0 2 2  Worry too much - different  things 1 2 2   Trouble relaxing 1 1 2   Restless 1 1 2   Easily annoyed or irritable 1 1 2   Afraid - awful might happen 0 1 2  Total GAD 7 Score 5 10 14   Anxiety Difficulty Somewhat difficult Somewhat difficult Very difficult   Past Medical History:  Past Medical History:  Diagnosis Date   Allergy    gluten, seasonal   Anxiety    Arthritis    Asthma    seasonal    Cancer ([place])    Thyroid   Complication of anesthesia    Constipation    Depression    GERD (gastroesophageal reflux disease)    Gestational diabetes mellitus    with 1 child.     H/O nose injury    accidently hit in the nose, has a stuffy nose, can't lie flat, wears nasal strips at night   History  of kidney stones      x 2 passed   Hypertension    Hypothyroidism    PONV (postoperative nausea and vomiting)    PTSD (post-traumatic stress disorder)    Thyroid cancer ([place])    Thyroid disease     Surgical History:  Past Surgical History:  Procedure Laterality Date   CHOLECYSTECTOMY N/A 04/11/2016   Procedure: LAPAROSCOPIC CHOLECYSTECTOMY WITH INTRAOPERATIVE CHOLANGIOGRAM;  Surgeon: Georganna Skeans, MD;  Location: [place];  Service: General;  Laterality: N/A;   COLONOSCOPY     KNEE SURGERY Right 2009   LAPAROTOMY N/A 04/17/2016   Procedure: LAPAROTOMY REPAIR OF INCARCERATED INCISIONAL HERNIA WITH [place];  Surgeon: Rolm Bookbinder, MD;  Location: [place];  Service: General;  Laterality: N/A;   THYROID SURGERY     2001   TUBAL LIGATION      Medications:  Current Outpatient Medications on File Prior to Visit  Medication Sig   acetaminophen (TYLENOL) 500 MG tablet Take 500 mg every 8 (eight) hours as needed by mouth.   ALPRAZolam (XANAX) 0.5 MG tablet TAKE 1 TABLET BY MOUTH TWICE A DAY AS NEEDED FOR ANXIETY   amphetamine-dextroamphetamine (ADDERALL XR) 20 MG 24 hr capsule Take 1 capsule (20 mg total) by mouth daily.   buPROPion (WELLBUTRIN XL) 300 MG 24 hr tablet TAKE 1 TABLET BY MOUTH EVERY DAY   Cholecalciferol (VITAMIN D) 2000 UNITS tablet Take 2,000 Units by mouth daily.   clotrimazole-betamethasone (LOTRISONE) cream Apply 1 application topically 2 (two) times daily as needed.   diclofenac Sodium (VOLTAREN) 1 % GEL APPLY AS IDRECTED   ibuprofen (ADVIL,MOTRIN) 200 MG tablet Take 400 mg by mouth every 8 (eight) hours as needed for mild pain or moderate pain.   omeprazole (PRILOSEC) 40 MG capsule TAKE 1 CAPSULE BY MOUTH EVERY DAY   ondansetron (ZOFRAN ODT) 4 MG disintegrating tablet Take 1 tablet (4 mg total) by mouth every 8 (eight) hours as needed for nausea or vomiting.   OVER THE COUNTER MEDICATION Nasal spay qhs   Probiotic Product (ALIGN) 4 MG CAPS Take 1 capsule by mouth  daily.   SYNTHROID 137 MCG tablet Take 1 tablet (137 mcg total) by mouth daily before breakfast.   No current facility-administered medications on file prior to visit.    Allergies:  Allergies  Allergen Reactions   Gluten Meal Rash   Levothyroxine Other (See Comments)    Rash, stomach "gnawing"   Zoloft [Sertraline Hcl] Other (See Comments)    STOMACH ACHES    Social History:  Social History   Socioeconomic History   Marital  status: Divorced    Spouse name: Not on file   Number of children: 2   Years of education: Not on file   Highest education level: Not on file  Occupational History   Occupation: lead teller  Tobacco Use   Smoking status: Former    Pack years: 0.00   Smokeless tobacco: Never   Tobacco comments:    social smoker- never a habit  Scientific laboratory technician Use: Never used  Substance and Sexual Activity   Alcohol use: Yes    Comment: occasionally    Drug use: No   Sexual activity: Not Currently  Other Topics Concern   Not on file  Social History Narrative   Not on file   Social Determinants of Health   Financial Resource Strain: Not on file  Food Insecurity: Not on file  Transportation Needs: Not on file  Physical Activity: Not on file  Stress: Not on file  Social Connections: Not on file  Intimate Partner Violence: Not on file   Social History   Tobacco Use  Smoking Status Former   Pack years: 0.00  Smokeless Tobacco Never  Tobacco Comments   social smoker- never a habit   Social History   Substance and Sexual Activity  Alcohol Use Yes   Comment: occasionally     Family History:  Family History  Problem Relation Age of Onset   Heart disease Mother    Diabetes Father    Heart disease Father    Hyperlipidemia Father    Hypertension Father    Diabetes Brother    Autism Son    Lupus Maternal Grandmother    Heart attack Maternal Grandfather    Cancer Paternal Grandmother    Kidney Stones Sister    Colon cancer Neg Hx    Rectal  cancer Neg Hx    Esophageal cancer Neg Hx    Liver cancer Neg Hx    Stomach cancer Neg Hx     Past medical history, surgical history, medications, allergies, family history and social history reviewed with patient today and changes made to appropriate areas of the chart.   Review of Systems  Constitutional:  Positive for fever and malaise/fatigue.  HENT:  Positive for congestion. Negative for hearing loss, nosebleeds, sinus pain and sore throat.   Eyes: Negative.  Negative for blurred vision and double vision.  Respiratory:  Positive for cough. Negative for shortness of breath.   Cardiovascular:  Negative for chest pain and palpitations.  Gastrointestinal: Negative.   Genitourinary: Negative.   Musculoskeletal: Negative.   Skin: Negative.  Negative for itching and rash.  Neurological: Negative.  Negative for dizziness, weakness and headaches.  Psychiatric/Behavioral:  Negative for depression. The patient has insomnia. The patient is not nervous/anxious.       Objective:    BP (!) 144/74   Pulse 97   Temp 99.2 F (37.3 C)   Ht 5\' 7"  (1.702 m)   Wt 259 lb (117.5 kg)   SpO2 94%   BMI 40.57 kg/m   Wt Readings from Last 3 Encounters:  08/24/20 259 lb (117.5 kg)  05/18/20 254 lb 12.8 oz (115.6 kg)  05/11/20 254 lb 12.8 oz (115.6 kg)    Physical Exam Vitals and nursing note reviewed.  Constitutional:      General: She is not in acute distress.    Appearance: Normal appearance. She is normal weight. She is not ill-appearing or toxic-appearing.  HENT:     Head: Normocephalic and atraumatic.  Right Ear: Tympanic membrane, ear canal and external ear normal.     Left Ear: Tympanic membrane, ear canal and external ear normal.     Nose: Nose normal. No congestion or rhinorrhea.     Mouth/Throat:     Mouth: Mucous membranes are moist.     Pharynx: Oropharynx is clear. No oropharyngeal exudate.  Eyes:     General: No scleral icterus.    Extraocular Movements: Extraocular  movements intact.     Pupils: Pupils are equal, round, and reactive to light.  Cardiovascular:     Rate and Rhythm: Normal rate and regular rhythm.     Pulses: Normal pulses.     Heart sounds: Normal heart sounds. No murmur heard. Pulmonary:     Effort: Pulmonary effort is normal. No respiratory distress.     Breath sounds: No wheezing or rhonchi.  Abdominal:     General: Abdomen is flat. Bowel sounds are normal. There is no distension.     Palpations: Abdomen is soft.     Tenderness: There is no abdominal tenderness.  Genitourinary:    Comments: deferred Musculoskeletal:        General: No swelling or tenderness. Normal range of motion.     Cervical back: Normal range of motion and neck supple. No rigidity or tenderness.     Right lower leg: No edema.     Left lower leg: No edema.  Skin:    General: Skin is warm and dry.     Capillary Refill: Capillary refill takes less than 2 seconds.     Coloration: Skin is not jaundiced or pale.  Neurological:     General: No focal deficit present.     Mental Status: She is alert and oriented to person, place, and time.     Motor: No weakness.     Gait: Gait normal.  Psychiatric:        Mood and Affect: Mood normal.        Behavior: Behavior normal.        Thought Content: Thought content normal.        Judgment: Judgment normal.    Results for orders placed or performed in visit on 08/17/20  Hemoglobin A1c  Result Value Ref Range   Hgb A1c MFr Bld 5.7 (H) <5.7 % of total Hgb   Mean Plasma Glucose 117 mg/dL   eAG (mmol/L) 6.5 mmol/L  Lipid panel  Result Value Ref Range   Cholesterol 192 <200 mg/dL   HDL 77 > OR = 50 mg/dL   Triglycerides 122 <150 mg/dL   LDL Cholesterol (Calc) 93 mg/dL (calc)   Total CHOL/HDL Ratio 2.5 <5.0 (calc)   Non-HDL Cholesterol (Calc) 115 <130 mg/dL (calc)  COMPLETE METABOLIC PANEL WITH GFR  Result Value Ref Range   Glucose, Bld 107 (H) 65 - 99 mg/dL   BUN 20 7 - 25 mg/dL   Creat 1.17 (H) 0.50 - 0.99  mg/dL   GFR, Est Non African American 49 (L) > OR = 60 mL/min/1.25m2   GFR, Est African American 57 (L) > OR = 60 mL/min/1.77m2   BUN/Creatinine Ratio 17 6 - 22 (calc)   Sodium 142 135 - 146 mmol/L   Potassium 4.6 3.5 - 5.3 mmol/L   Chloride 106 98 - 110 mmol/L   CO2 24 20 - 32 mmol/L   Calcium 9.0 8.6 - 10.4 mg/dL   Total Protein 6.5 6.1 - 8.1 g/dL   Albumin 4.1 3.6 - 5.1 g/dL  Globulin 2.4 1.9 - 3.7 g/dL (calc)   AG Ratio 1.7 1.0 - 2.5 (calc)   Total Bilirubin 0.4 0.2 - 1.2 mg/dL   Alkaline phosphatase (APISO) 62 37 - 153 U/L   AST 14 10 - 35 U/L   ALT 11 6 - 29 U/L  CBC with Differential/Platelet  Result Value Ref Range   WBC 6.8 3.8 - 10.8 Thousand/uL   RBC 4.67 3.80 - 5.10 Million/uL   Hemoglobin 13.4 11.7 - 15.5 g/dL   HCT 41.8 35.0 - 45.0 %   MCV 89.5 80.0 - 100.0 fL   MCH 28.7 27.0 - 33.0 pg   MCHC 32.1 32.0 - 36.0 g/dL   RDW 13.6 11.0 - 15.0 %   Platelets 215 140 - 400 Thousand/uL   MPV 11.9 7.5 - 12.5 fL   Neutro Abs 4,814 1,500 - 7,800 cells/uL   Lymphs Abs 1,299 850 - 3,900 cells/uL   Absolute Monocytes 503 200 - 950 cells/uL   Eosinophils Absolute 143 15 - 500 cells/uL   Basophils Absolute 41 0 - 200 cells/uL   Neutrophils Relative % 70.8 %   Total Lymphocyte 19.1 %   Monocytes Relative 7.4 %   Eosinophils Relative 2.1 %   Basophils Relative 0.6 %  TSH  Result Value Ref Range   TSH 6.52 (H) 0.40 - 4.50 mIU/L      Assessment & Plan:   Problem List Items Addressed This Visit       Cardiovascular and Mediastinum   Essential hypertension, benign    Blood pressure elevated today in clinic.  Patient reports her blood pressure has been better at home, however is using wrist cuff.  Encouraged her to use arm cuff at home and notify us if pressures consistently greater than 140/90.  Recent kidney function slightly elevated-question dehydration, however want to monitor closely.  We will have patient return in 1 to 2 weeks to recheck kidney function with CMP.   Continue lisinopril at 5 mg daily for now.  Follow-up in 3 months.       Relevant Medications   lisinopril (ZESTRIL) 5 MG tablet   rosuvastatin (CRESTOR) 10 MG tablet     Endocrine   Postsurgical hypothyroidism    Chronic.  Had entire thyroid removed.  TSH slightly elevated-follows with endocrinology, however requesting to be referred to new endocrinologist today.  Will place referral per patient request and defer medication changes to specialist.       Relevant Orders   Ambulatory referral to Endocrinology     Other   Prediabetes    A1c 5.23 August 2020.  Discussed dietary lifestyle changes including limiting simple carbohydrates and sugary foods and drinks.  Encourage increasing physical activity-goal is 30 minutes 4 times weekly.       Mixed hyperlipidemia    Chronic.  Recent lipids show total cholesterol 192 and LDL 93.  Continue Crestor 5 mg daily for now.       Relevant Medications   lisinopril (ZESTRIL) 5 MG tablet   rosuvastatin (CRESTOR) 10 MG tablet   Major depressive disorder    Chronic.  PHQ-9 slightly elevated today-however patient thinks related to thyroid.  Will not change Wellbutrin which patient has been on for years.  Continue at 300 mg daily.  Follow-up 6 months.       Relevant Orders   Ambulatory referral to Psychology   GAD (generalized anxiety disorder)    Chronic.  GAD-7 slightly elevated today.  Maintains well on Wellbutrin 300 mg  daily-continue this medication for now.  Follow-up 6 months.       Relevant Orders   Ambulatory referral to Psychology   Controlled substance agreement signed    For Adderall 20 mg and alprazolam 0.5 mg.  Uses both very sparingly.       Other Visit Diagnoses     Annual physical exam    -  Primary   Function kidney decreased       Relevant Orders   COMPLETE METABOLIC PANEL WITH GFR   Screening for colon cancer       Relevant Orders   Ambulatory referral to Gastroenterology   Upper respiratory tract infection,  unspecified type       Relevant Orders   SARS-CoV-2 RNA (COVID-19) and Respiratory Viral Panel, Qualitative NAAT   Encounter for screening mammogram for malignant neoplasm of breast       Relevant Orders   MM 3D SCREEN BREAST BILATERAL       Upper respiratory tract infection, unspecified type Acute.  Viral testing obtained.  Reassured patient that symptoms and exam findings are most consistent with a viral upper respiratory infection and explained lack of efficacy of antibiotics against viruses.  Discussed expected course and features suggestive of secondary bacterial infection.  Continue supportive care. Increase fluid intake with water or electrolyte solution like pedialyte. Encouraged acetaminophen as needed for fever/pain. Encouraged salt water gargling, chloraseptic spray and throat lozenges. Encouraged OTC guaifenesin. Encouraged saline sinus flushes and/or neti with humidified air.    - SARS-CoV-2 RNA (COVID-19) and Respiratory Viral Panel, Qualitative NAAT      Follow up plan: Return in about 3 months (around 11/24/2020).   LABORATORY TESTING:  - Pap smear:  Patient wishes to defer for now  IMMUNIZATIONS:   - Tdap: Tetanus vaccination status reviewed: last tetanus booster within 10 years. - Influenza: Up to date - Pneumovax: Not applicable - Prevnar: Not applicable - HPV: Not applicable - Zostavax vaccine: Refused - COVID-19 vaccine: Received 2 vaccines and booster, however does not have booster date.  SCREENING: -Mammogram: Ordered today  - Colonoscopy: Ordered today  - Bone Density: Not applicable  -Hearing Test: Not applicable  -Spirometry: not applicable   PATIENT COUNSELING:   Advised to take 1 mg of folate supplement per day if capable of pregnancy.   Sexuality: Discussed sexually transmitted diseases, partner selection, use of condoms, avoidance of unintended pregnancy  and contraceptive alternatives.   Advised to avoid cigarette smoking.  I discussed  with the patient that most people either abstain from alcohol or drink within safe limits (<=14/week and <=4 drinks/occasion for males, <=7/weeks and <= 3 drinks/occasion for females) and that the risk for alcohol disorders and other health effects rises proportionally with the number of drinks per week and how often a drinker exceeds daily limits.  Discussed cessation/primary prevention of drug use and availability of treatment for abuse.   Diet: Encouraged to adjust caloric intake to maintain  or achieve ideal body weight, to reduce intake of dietary saturated fat and total fat, to limit sodium intake by avoiding high sodium foods and not adding table salt, and to maintain adequate dietary potassium and calcium preferably from fresh fruits, vegetables, and low-fat dairy products.    stressed the importance of regular exercise  Injury prevention: Discussed safety belts, safety helmets, smoke detector, smoking near bedding or upholstery.   Dental health: Discussed importance of regular tooth brushing, flossing, and dental visits.    NEXT PREVENTATIVE PHYSICAL DUE IN 1 YEAR.  Return in about 3 months (around 11/24/2020).

## 2020-08-25 DIAGNOSIS — Z79899 Other long term (current) drug therapy: Secondary | ICD-10-CM | POA: Insufficient documentation

## 2020-08-25 NOTE — Assessment & Plan Note (Signed)
Chronic.  GAD-7 slightly elevated today.  Maintains well on Wellbutrin 300 mg daily-continue this medication for now.  Follow-up 6 months.

## 2020-08-25 NOTE — Assessment & Plan Note (Signed)
A1c 5.23 August 2020.  Discussed dietary lifestyle changes including limiting simple carbohydrates and sugary foods and drinks.  Encourage increasing physical activity-goal is 30 minutes 4 times weekly.

## 2020-08-25 NOTE — Assessment & Plan Note (Signed)
Chronic.  Had entire thyroid removed.  TSH slightly elevated-follows with endocrinology, however requesting to be referred to new endocrinologist today.  Will place referral per patient request and defer medication changes to specialist.

## 2020-08-25 NOTE — Assessment & Plan Note (Signed)
Chronic.  Recent lipids show total cholesterol 192 and LDL 93.  Continue Crestor 5 mg daily for now.

## 2020-08-25 NOTE — Assessment & Plan Note (Signed)
Chronic.  PHQ-9 slightly elevated today-however patient thinks related to thyroid.  Will not change Wellbutrin which patient has been on for years.  Continue at 300 mg daily.  Follow-up 6 months.

## 2020-08-25 NOTE — Assessment & Plan Note (Addendum)
Blood pressure elevated today in clinic.  Patient reports her blood pressure has been better at home, however is using wrist cuff.  Encouraged her to use arm cuff at home and notify us if pressures consistently greater than 140/90.  Recent kidney function slightly elevated-question dehydration, however want to monitor closely.  We will have patient return in 1 to 2 weeks to recheck kidney function with CMP.  Continue lisinopril at 5 mg daily for now.  Follow-up in 3 months.

## 2020-08-25 NOTE — Assessment & Plan Note (Signed)
For Adderall 20 mg and alprazolam 0.5 mg.  Uses both very sparingly.

## 2020-08-28 ENCOUNTER — Encounter: Payer: Self-pay | Admitting: Nurse Practitioner

## 2020-08-28 LAB — SARS-COV-2 RNA (COVID-19) RESP VIRAL PNL QL NAAT

## 2020-09-07 ENCOUNTER — Other Ambulatory Visit: Payer: Self-pay | Admitting: "Endocrinology

## 2020-09-07 ENCOUNTER — Other Ambulatory Visit: Payer: No Typology Code available for payment source

## 2020-09-07 ENCOUNTER — Other Ambulatory Visit: Payer: Self-pay

## 2020-09-07 DIAGNOSIS — N289 Disorder of kidney and ureter, unspecified: Secondary | ICD-10-CM

## 2020-09-07 DIAGNOSIS — E890 Postprocedural hypothyroidism: Secondary | ICD-10-CM

## 2020-09-07 LAB — COMPLETE METABOLIC PANEL WITH GFR
AG Ratio: 1.5 (calc) (ref 1.0–2.5)
ALT: 12 U/L (ref 6–29)
AST: 15 U/L (ref 10–35)
Albumin: 3.9 g/dL (ref 3.6–5.1)
Alkaline phosphatase (APISO): 52 U/L (ref 37–153)
BUN/Creatinine Ratio: 17 (calc) (ref 6–22)
BUN: 18 mg/dL (ref 7–25)
CO2: 28 mmol/L (ref 20–32)
Calcium: 9.4 mg/dL (ref 8.6–10.4)
Chloride: 107 mmol/L (ref 98–110)
Creat: 1.05 mg/dL — ABNORMAL HIGH (ref 0.50–0.99)
GFR, Est African American: 65 mL/min/{1.73_m2} (ref >=60)
GFR, Est Non African American: 56 mL/min/{1.73_m2} — ABNORMAL LOW (ref >=60)
Globulin: 2.6 g/dL (calc) (ref 1.9–3.7)
Glucose, Bld: 100 mg/dL — ABNORMAL HIGH (ref 65–99)
Potassium: 4.8 mmol/L (ref 3.5–5.3)
Sodium: 143 mmol/L (ref 135–146)
Total Bilirubin: 0.3 mg/dL (ref 0.2–1.2)
Total Protein: 6.5 g/dL (ref 6.1–8.1)

## 2020-09-07 LAB — THYROID PANEL WITH TSH
Free Thyroxine Index: 2.7 (ref 1.4–3.8)
T3 Uptake: 30 % (ref 22–35)
T4, Total: 9 ug/dL (ref 5.1–11.9)
TSH: 5.46 mIU/L — ABNORMAL HIGH (ref 0.40–4.50)

## 2020-09-09 ENCOUNTER — Encounter: Payer: Self-pay | Admitting: Nurse Practitioner

## 2020-09-09 DIAGNOSIS — F900 Attention-deficit hyperactivity disorder, predominantly inattentive type: Secondary | ICD-10-CM

## 2020-09-09 NOTE — Telephone Encounter (Signed)
Ok to refill Xanax, Adderall and Zofran?

## 2020-09-14 NOTE — Telephone Encounter (Signed)
Is she completely out of these medications?  I do not mind giving courtesy refill if she is. Please inform her I prefer to do so at the office visit instead of after OV for the future.

## 2020-09-20 NOTE — Telephone Encounter (Signed)
Called pt, lm to see if she was out of the medication she is requesting

## 2020-09-21 ENCOUNTER — Encounter: Payer: Self-pay | Admitting: Nurse Practitioner

## 2020-09-22 NOTE — Telephone Encounter (Signed)
Ok to refill??  Last office visit 08/24/2020.  Last refill  03/08/2020, #1 refill on Xanax.   Last refill 09/12/2018 on Zofran.

## 2020-09-23 NOTE — Telephone Encounter (Signed)
LMFCB regarding medication

## 2020-09-29 ENCOUNTER — Other Ambulatory Visit: Payer: Self-pay

## 2020-09-30 MED ORDER — ONDANSETRON 4 MG PO TBDP: 4.0000 mg | ORAL_TABLET | Freq: Three times a day (TID) | ORAL | 2 refills | Status: DC | PRN

## 2020-09-30 MED ORDER — ALPRAZOLAM 0.5 MG PO TABS: ORAL_TABLET | ORAL | 0 refills | Status: DC

## 2020-09-30 NOTE — Telephone Encounter (Signed)
PDMP reviewed - last refill of alprazolam in December, patient does not use daily.  Will discuss with patient at upcoming visit in September.

## 2020-11-09 ENCOUNTER — Ambulatory Visit: Payer: No Typology Code available for payment source | Admitting: "Endocrinology

## 2020-11-14 ENCOUNTER — Other Ambulatory Visit: Payer: Self-pay | Admitting: "Endocrinology

## 2020-11-14 DIAGNOSIS — I10 Essential (primary) hypertension: Secondary | ICD-10-CM

## 2020-11-16 ENCOUNTER — Other Ambulatory Visit: Payer: Self-pay

## 2020-11-16 ENCOUNTER — Ambulatory Visit
Admission: RE | Admit: 2020-11-16 | Discharge: 2020-11-16 | Disposition: A | Discharge status: Home / Self Care | Payer: No Typology Code available for payment source | Source: Ambulatory Visit | Attending: Nurse Practitioner | Admitting: Nurse Practitioner

## 2020-11-16 DIAGNOSIS — Z1231 Encounter for screening mammogram for malignant neoplasm of breast: Secondary | ICD-10-CM

## 2020-11-23 ENCOUNTER — Encounter: Payer: Self-pay | Admitting: Family Medicine

## 2020-11-23 ENCOUNTER — Ambulatory Visit: Payer: No Typology Code available for payment source | Admitting: Family Medicine

## 2020-11-23 ENCOUNTER — Other Ambulatory Visit: Payer: Self-pay

## 2020-11-23 VITALS — BP 136/74 | HR 83 | Wt 263.4 lb

## 2020-11-23 DIAGNOSIS — Z5320 Procedure and treatment not carried out because of patient's decision for unspecified reasons: Secondary | ICD-10-CM

## 2020-11-23 NOTE — Progress Notes (Signed)
   Initially planned to establish care.  Has an appointment at [facility] next week and now plans to continue care with current PCP.   BP 136/74, HR 83, O2 93%, weight 263.4lbs  Carollee Leitz, MD [place]

## 2020-11-23 NOTE — Patient Instructions (Signed)
Thank you for coming to see me today. It was a pleasure.     Please follow-up with PCP as needed  If you have any questions or concerns, please do not hesitate to call the office at [phone].  Best,   Carollee Leitz, MD

## 2020-11-24 ENCOUNTER — Other Ambulatory Visit: Payer: Self-pay | Admitting: *Deleted

## 2020-11-24 ENCOUNTER — Encounter: Payer: Self-pay | Admitting: Nurse Practitioner

## 2020-11-24 DIAGNOSIS — N289 Disorder of kidney and ureter, unspecified: Secondary | ICD-10-CM

## 2020-11-24 DIAGNOSIS — I10 Essential (primary) hypertension: Secondary | ICD-10-CM

## 2020-11-24 DIAGNOSIS — R7303 Prediabetes: Secondary | ICD-10-CM

## 2020-11-24 DIAGNOSIS — E782 Mixed hyperlipidemia: Secondary | ICD-10-CM

## 2020-11-24 DIAGNOSIS — E890 Postprocedural hypothyroidism: Secondary | ICD-10-CM

## 2020-11-25 ENCOUNTER — Other Ambulatory Visit: Payer: No Typology Code available for payment source

## 2020-11-25 ENCOUNTER — Other Ambulatory Visit: Payer: Self-pay

## 2020-11-25 DIAGNOSIS — E890 Postprocedural hypothyroidism: Secondary | ICD-10-CM

## 2020-11-25 DIAGNOSIS — R7303 Prediabetes: Secondary | ICD-10-CM

## 2020-11-25 DIAGNOSIS — E782 Mixed hyperlipidemia: Secondary | ICD-10-CM

## 2020-11-25 DIAGNOSIS — E038 Other specified hypothyroidism: Secondary | ICD-10-CM

## 2020-11-25 DIAGNOSIS — I10 Essential (primary) hypertension: Secondary | ICD-10-CM

## 2020-11-25 DIAGNOSIS — Z13220 Encounter for screening for lipoid disorders: Secondary | ICD-10-CM

## 2020-11-26 LAB — LIPID PANEL
Cholesterol: 172 mg/dL (ref <200)
HDL: 78 mg/dL (ref >=50)
LDL Cholesterol (Calc): 74 mg/dL (calc)
Non-HDL Cholesterol (Calc): 94 mg/dL (calc) (ref <130)
Total CHOL/HDL Ratio: 2.2 (calc) (ref <5.0)
Triglycerides: 117 mg/dL (ref <150)

## 2020-11-26 LAB — COMPLETE METABOLIC PANEL WITH GFR
AG Ratio: 1.4 (calc) (ref 1.0–2.5)
ALT: 18 U/L (ref 6–29)
AST: 18 U/L (ref 10–35)
Albumin: 4 g/dL (ref 3.6–5.1)
Alkaline phosphatase (APISO): 68 U/L (ref 37–153)
BUN/Creatinine Ratio: 13 (calc) (ref 6–22)
BUN: 14 mg/dL (ref 7–25)
CO2: 26 mmol/L (ref 20–32)
Calcium: 9.2 mg/dL (ref 8.6–10.4)
Chloride: 107 mmol/L (ref 98–110)
Creat: 1.1 mg/dL — ABNORMAL HIGH (ref 0.50–1.05)
Globulin: 2.8 g/dL (calc) (ref 1.9–3.7)
Glucose, Bld: 106 mg/dL — ABNORMAL HIGH (ref 65–99)
Potassium: 4.6 mmol/L (ref 3.5–5.3)
Sodium: 141 mmol/L (ref 135–146)
Total Bilirubin: 0.4 mg/dL (ref 0.2–1.2)
Total Protein: 6.8 g/dL (ref 6.1–8.1)
eGFR: 56 mL/min/{1.73_m2} — ABNORMAL LOW (ref >=60)

## 2020-11-26 LAB — CBC WITH DIFFERENTIAL/PLATELET
Absolute Monocytes: 448 cells/uL (ref 200–950)
Basophils Absolute: 41 cells/uL (ref 0–200)
Basophils Relative: 0.7 %
Eosinophils Absolute: 100 cells/uL (ref 15–500)
Eosinophils Relative: 1.7 %
HCT: 41.1 % (ref 35.0–45.0)
Hemoglobin: 12.8 g/dL (ref 11.7–15.5)
Lymphs Abs: 1133 cells/uL (ref 850–3900)
MCH: 27.8 pg (ref 27.0–33.0)
MCHC: 31.1 g/dL — ABNORMAL LOW (ref 32.0–36.0)
MCV: 89.3 fL (ref 80.0–100.0)
MPV: 11.8 fL (ref 7.5–12.5)
Monocytes Relative: 7.6 %
Neutro Abs: 4177 cells/uL (ref 1500–7800)
Neutrophils Relative %: 70.8 %
Platelets: 220 10*3/uL (ref 140–400)
RBC: 4.6 10*6/uL (ref 3.80–5.10)
RDW: 13.5 % (ref 11.0–15.0)
Total Lymphocyte: 19.2 %
WBC: 5.9 10*3/uL (ref 3.8–10.8)

## 2020-11-26 LAB — HEMOGLOBIN A1C
Hgb A1c MFr Bld: 5.6 % of total Hgb (ref <5.7)
Mean Plasma Glucose: 114 mg/dL
eAG (mmol/L): 6.3 mmol/L

## 2020-11-26 LAB — TSH: TSH: 11.06 mIU/L — ABNORMAL HIGH (ref 0.40–4.50)

## 2020-11-28 ENCOUNTER — Other Ambulatory Visit: Payer: Self-pay | Admitting: Nurse Practitioner

## 2020-11-29 NOTE — Progress Notes (Signed)
Subjective:    Patient ID: Crystal Waters, female    DOB: 11-13-1955, 65 y.o.   MRN: [id number]  HPI: Crystal Waters is a 65 y.o. female presenting for follow up.    Chief Complaint  Patient presents with   Follow-up    Follow up , stomach pain , lower back pain going on for about like dull pain , urination freq.   BACK PAIN Duration: A couple weeks Mechanism of injury: Unknown Location: Right and low back Onset: sudden Severity: Moderate to severe Quality: Dull ache Frequency: Intermittent Radiation: none Aggravating factors: Certain movements Relief with NSAIDs?: No NSAIDs Taken Nighttime pain:  no Paresthesias / decreased sensation:  no Bowel / bladder incontinence:  no Fevers:  no Dysuria / urinary frequency:  yes  HYPERTENSION/HYPERLIPIDEMIA Currently taking lisinopril 5 mg daily.  Taking Crestor 5 mg daily for cholesterol. Hypertension status: chronic  Satisfied with current treatment? yes Duration of hypertension: chronic BP monitoring frequency:  rarely BP range: less than 130/80 BP medication side effects:  no Medication compliance: Excellent Aspirin: no Recurrent headaches: no Visual changes: no Palpitations: no Dyspnea: no Chest pain: no Lower extremity edema: no Dizzy/lightheaded: no  HYPOTHYROIDISM Currently taking namebrand Synthroid 137 mcg daily.  Referral to endocrinologist is pending-has upcoming appointment. Thyroid control status:uncontrolled Satisfied with current treatment? no Medication side effects: no Medication compliance: excellent compliance Etiology of hypothyroidism: Postsurgical Recent dose adjustment:no Fatigue: yes Cold intolerance: no Heat intolerance: no Weight gain: no Weight loss: no Constipation: yes Diarrhea/loose stools: no Palpitations: no Lower extremity edema: no Anxiety/depressed mood: no  INSOMNIA This is a chronic problem.  Patient takes alprazolam 0.5 mg extremely sparingly to help her sleep on top  days.  This does help significantly.  Attention deficit disorder Has trouble focusing all of the time, however does not feel she needs to take Adderall daily.  She takes Adderall XR 20 mg sparingly, she is requesting a refill today.  This does help her when she needs it.  DEPRESSION Patient reports her mood is stable on Wellbutrin XL 300 mg daily. Depression screen [facility] 2/9 11/30/2020 11/23/2020 08/24/2020 12/07/2019 05/11/2019  Decreased Interest 0 1 0 0 0  Down, Depressed, Hopeless 0 1 0 0 0  PHQ - 2 Score 0 2 0 0 0  Altered sleeping - '3 3 2 3  '$ Tired, decreased energy - '2 3 1 1  '$ Change in appetite - 2 1 0 0  Feeling bad or failure about yourself  - 0 0 0 0  Trouble concentrating - '2 1 2 1  '$ Moving slowly or fidgety/restless - 1 0 0 0  Suicidal thoughts - 0 0 0 0  PHQ-9 Score - '12 8 5 5  '$ Difficult doing work/chores - Somewhat difficult Somewhat difficult Not difficult at all Somewhat difficult  Some recent data might be hidden   PRE-DIABETES Last A1c was stable in June 2022-5.7%.  She has been working on diet, not really working on increasing physical activity. Hypoglycemic episodes:no Polydipsia/polyuria: no Visual disturbance: no Chest pain: no Paresthesias: no Physical activity: not very active Water: drinks at least 64 oz daily  Allergies  Allergen Reactions   Gluten Meal Rash   Levothyroxine Other (See Comments)    Rash, stomach "gnawing"   Zoloft [Sertraline Hcl] Other (See Comments)    STOMACH ACHES    Outpatient Encounter Medications as of 11/30/2020  Medication Sig Note   acetaminophen (TYLENOL) 500 MG tablet Take 500 mg every 8 (eight) hours as needed  by mouth.    buPROPion (WELLBUTRIN XL) 300 MG 24 hr tablet TAKE 1 TABLET BY MOUTH EVERY DAY    Cholecalciferol (VITAMIN D) 2000 UNITS tablet Take 2,000 Units by mouth daily.    clotrimazole-betamethasone (LOTRISONE) cream Apply 1 application topically 2 (two) times daily as needed.    diclofenac Sodium (VOLTAREN) 1 % GEL  APPLY AS IDRECTED    gabapentin (NEURONTIN) 300 MG capsule Take 300 mg by mouth daily.    ibuprofen (ADVIL,MOTRIN) 200 MG tablet Take 400 mg by mouth every 8 (eight) hours as needed for mild pain or moderate pain.    lisinopril (ZESTRIL) 5 MG tablet Take 1 tablet (5 mg total) by mouth daily.    omeprazole (PRILOSEC) 40 MG capsule TAKE 1 CAPSULE BY MOUTH EVERY DAY    ondansetron (ZOFRAN ODT) 4 MG disintegrating tablet Take 1 tablet (4 mg total) by mouth every 8 (eight) hours as needed for nausea or vomiting.    OVER THE COUNTER MEDICATION Nasal spay qhs    Probiotic Product (ALIGN) 4 MG CAPS Take 1 capsule by mouth daily.    rosuvastatin (CRESTOR) 10 MG tablet Take 0.5 tablets (5 mg total) by mouth daily.    SYNTHROID 150 MCG tablet Take 1 tablet (150 mcg total) by mouth daily before breakfast.    [DISCONTINUED] ALPRAZolam (XANAX) 0.5 MG tablet TAKE 1 TABLET BY MOUTH TWICE A DAY AS NEEDED FOR ANXIETY    [DISCONTINUED] amphetamine-dextroamphetamine (ADDERALL XR) 20 MG 24 hr capsule Take 1 capsule (20 mg total) by mouth daily. 08/24/2020: Not taking daily   [DISCONTINUED] SYNTHROID 137 MCG tablet Take 1 tablet (137 mcg total) by mouth daily before breakfast.    ALPRAZolam (XANAX) 0.5 MG tablet Take 1 tablet (0.5 mg total) by mouth daily as needed for anxiety.    amphetamine-dextroamphetamine (ADDERALL XR) 20 MG 24 hr capsule Take 1 capsule (20 mg total) by mouth daily as needed (ADHD).    No facility-administered encounter medications on file as of 11/30/2020.    Patient Active Problem List   Diagnosis Date Noted   Controlled substance agreement signed 08/25/2020   Mixed hyperlipidemia 05/11/2020   Stress at work 11/11/2018   Candidiasis of skin 09/12/2018   ADD (attention deficit disorder) 05/06/2018   Postsurgical hypothyroidism 09/18/2017   History of thyroid cancer 09/18/2017   GERD (gastroesophageal reflux disease) 12/24/2016   Incarcerated incisional hernia 04/18/2016   Left shoulder  pain 10/03/2015   Prediabetes 02/07/2015   Atypical nevi 10/29/2013   OA (osteoarthritis) of knee 01/15/2013   Class 2 obesity 01/15/2013   Essential hypertension, benign 01/15/2013   Chronic insomnia 01/15/2013   Major depressive disorder 01/15/2013   GAD (generalized anxiety disorder) 01/15/2013    Past Medical History:  Diagnosis Date   Allergy    gluten, seasonal   Anxiety    Arthritis    Asthma    seasonal    Cancer ([place])    Thyroid   Complication of anesthesia    Constipation    Depression    GERD (gastroesophageal reflux disease)    Gestational diabetes mellitus    with 1 child.     H/O nose injury    accidently hit in the nose, has a stuffy nose, can't lie flat, wears nasal strips at night   History of kidney stones      x 2 passed   Hypertension    Hypothyroidism    PONV (postoperative nausea and vomiting)    PTSD (post-traumatic stress disorder)  Thyroid cancer ([place])    Thyroid disease     Relevant past medical, surgical, family and social history reviewed and updated as indicated. Interim medical history since our last visit reviewed.  Review of Systems Per HPI unless specifically indicated above     Objective:    BP 138/82 (BP Location: Left Arm, Patient Position: Sitting, Cuff Size: Large)   Pulse 74   Ht '5\' 7"'$  (1.702 m)   Wt 264 lb 6.4 oz (119.9 kg)   SpO2 98%   BMI 41.41 kg/m   Wt Readings from Last 3 Encounters:  11/30/20 264 lb 6.4 oz (119.9 kg)  11/23/20 263 lb 6.4 oz (119.5 kg)  08/24/20 259 lb (117.5 kg)    Physical Exam Vitals and nursing note reviewed.  Constitutional:      General: She is not in acute distress.    Appearance: Normal appearance. She is obese. She is not toxic-appearing.  HENT:     Mouth/Throat:     Pharynx: Oropharynx is clear.  Eyes:     General: No scleral icterus.    Extraocular Movements: Extraocular movements intact.  Neck:     Vascular: No carotid bruit.  Cardiovascular:     Rate and Rhythm: Normal  rate and regular rhythm.     Heart sounds: Normal heart sounds. No murmur heard. Pulmonary:     Effort: Pulmonary effort is normal. No respiratory distress.     Breath sounds: Normal breath sounds. No wheezing, rhonchi or rales.  Abdominal:     General: Abdomen is flat. Bowel sounds are normal. There is no distension.     Palpations: Abdomen is soft. There is no mass.     Tenderness: There is no abdominal tenderness. There is no right CVA tenderness, left CVA tenderness or rebound.  Musculoskeletal:     Cervical back: Normal range of motion.     Lumbar back: Tenderness present. No swelling. Normal range of motion. Negative right straight leg raise test and negative left straight leg raise test.       Back:     Right lower leg: No edema.     Left lower leg: No edema.     Comments: Exquisite tenderness to palpation, no CVA tenderness  Skin:    General: Skin is warm and dry.     Capillary Refill: Capillary refill takes less than 2 seconds.     Coloration: Skin is not jaundiced or pale.     Findings: No erythema.  Neurological:     Mental Status: She is alert and oriented to person, place, and time.  Psychiatric:        Mood and Affect: Mood normal.        Behavior: Behavior normal.        Thought Content: Thought content normal.        Judgment: Judgment normal.      Assessment & Plan:   Problem List Items Addressed This Visit       Cardiovascular and Mediastinum   Essential hypertension, benign    Chronic.  Blood pressure is borderline elevated today.  However, given patient's back pain, I suspect this may be contributing.  Patient reports blood pressures consistently less than 130/80 at home, I advised her to continue checking her blood pressure and notify us if the readings are greater than 130/80.  Continue lisinopril 5 mg a day for now.  Follow-up in 3 months.        Endocrine   Postsurgical hypothyroidism  TSH remains elevated-referral is pending to endocrinology.   Since TSH has been elevated for more than a couple of months now, I will increase Synthroid to 150 mcg daily.  I would still like her to follow-up with endocrinologist next month.      Relevant Medications   SYNTHROID 150 MCG tablet     Other   Prediabetes    Discussed labs with patient-hemoglobin A1c is no longer in the prediabetes range.  Congratulated on this great success-continue closely watching and limiting simple sugar intake and carbohydrate intake.  I encouraged increasing physical activity-goal is 30 minutes 5 times weekly.      Mixed hyperlipidemia    Chronic.  Reviewed labs-lipid profile shows total cholesterol and LDL have improved from last check.  We will plan to continue rosuvastatin 5 mg daily.  Follow-up in 6 months.      Major depressive disorder - Primary    Chronic.  PHQ is not elevated today.  Reports mood is stable with Wellbutrin XL 300 mg daily.  We will continue current medication and follow-up in 6 months.      Relevant Medications   ALPRAZolam (XANAX) 0.5 MG tablet   GAD (generalized anxiety disorder)    Chronic.  PHQ is not elevated today.  Reports mood is stable with Wellbutrin XL 300 mg daily.  We will continue current medication and follow-up in 6 months.      Relevant Medications   ALPRAZolam (XANAX) 0.5 MG tablet   Chronic insomnia    Chronic.  Stable with as needed very seldom use of alprazolam.  PDMP reviewed and appropriate.  Controlled substance agreement has been signed in the past.  Needs UDS.  Refill given today.  Follow-up in 3 months.      ADD (attention deficit disorder)    Chronic, stable.  Takes Adderall XR 20 mg daily sparingly.  Requesting refill today-refill given.  Follow-up in 3 months.      Relevant Medications   amphetamine-dextroamphetamine (ADDERALL XR) 20 MG 24 hr capsule   Other Visit Diagnoses     Acute right-sided low back pain without sciatica       Acute. Check UA to check UTI vs. kidney stone.  Suspect  musculoskeletal given tenderness to palpation-start back exercises/stretches.  F/u if not improving.   Relevant Orders   Urinalysis, Routine w reflex microscopic (Completed)   Urine Culture (Completed)   Function kidney decreased       Need for pneumococcal vaccine       Relevant Orders   Pneumococcal conjugate vaccine 13-valent IM (Completed)   Screening for osteoporosis       Relevant Orders   DG Bone Density        Follow up plan: Return in about 3 months (around 03/01/2021) for follow up with pap smear.

## 2020-11-30 ENCOUNTER — Ambulatory Visit: Payer: No Typology Code available for payment source | Admitting: Nurse Practitioner

## 2020-11-30 ENCOUNTER — Other Ambulatory Visit: Payer: Self-pay

## 2020-11-30 ENCOUNTER — Encounter: Payer: Self-pay | Admitting: Nurse Practitioner

## 2020-11-30 VITALS — BP 138/82 | HR 74 | Ht 67.0 in | Wt 264.4 lb

## 2020-11-30 DIAGNOSIS — F3342 Major depressive disorder, recurrent, in full remission: Secondary | ICD-10-CM

## 2020-11-30 DIAGNOSIS — E782 Mixed hyperlipidemia: Secondary | ICD-10-CM

## 2020-11-30 DIAGNOSIS — F411 Generalized anxiety disorder: Secondary | ICD-10-CM

## 2020-11-30 DIAGNOSIS — M545 Low back pain, unspecified: Secondary | ICD-10-CM

## 2020-11-30 DIAGNOSIS — Z13820 Encounter for screening for osteoporosis: Secondary | ICD-10-CM

## 2020-11-30 DIAGNOSIS — I10 Essential (primary) hypertension: Secondary | ICD-10-CM | POA: Diagnosis not present

## 2020-11-30 DIAGNOSIS — E890 Postprocedural hypothyroidism: Secondary | ICD-10-CM

## 2020-11-30 DIAGNOSIS — Z23 Encounter for immunization: Secondary | ICD-10-CM

## 2020-11-30 DIAGNOSIS — N289 Disorder of kidney and ureter, unspecified: Secondary | ICD-10-CM

## 2020-11-30 DIAGNOSIS — F5104 Psychophysiologic insomnia: Secondary | ICD-10-CM

## 2020-11-30 DIAGNOSIS — R7303 Prediabetes: Secondary | ICD-10-CM

## 2020-11-30 DIAGNOSIS — F900 Attention-deficit hyperactivity disorder, predominantly inattentive type: Secondary | ICD-10-CM

## 2020-11-30 LAB — URINALYSIS, ROUTINE W REFLEX MICROSCOPIC
Bacteria, UA: NONE SEEN /HPF
Bilirubin Urine: NEGATIVE
Glucose, UA: NEGATIVE
Hyaline Cast: NONE SEEN /LPF
Ketones, ur: NEGATIVE
Nitrite: NEGATIVE
Specific Gravity, Urine: 1.01 (ref 1.001–1.035)
pH: 6.5 (ref 5.0–8.0)

## 2020-11-30 LAB — MICROSCOPIC MESSAGE

## 2020-11-30 MED ORDER — ALPRAZOLAM 0.5 MG PO TABS: 0.5000 mg | ORAL_TABLET | Freq: Every day | ORAL | 0 refills | Status: AC | PRN

## 2020-11-30 MED ORDER — SYNTHROID 150 MCG PO TABS: 150.0000 ug | ORAL_TABLET | Freq: Every day | ORAL | 0 refills | Status: AC

## 2020-11-30 MED ORDER — AMPHETAMINE-DEXTROAMPHET ER 20 MG PO CP24: 20.0000 mg | ORAL_CAPSULE | Freq: Every day | ORAL | 0 refills | Status: DC | PRN

## 2020-12-01 LAB — URINE CULTURE
MICRO NUMBER:: 12372875
Result:: NO GROWTH
SPECIMEN QUALITY:: ADEQUATE

## 2020-12-03 NOTE — Assessment & Plan Note (Signed)
TSH remains elevated-referral is pending to endocrinology.  Since TSH has been elevated for more than a couple of months now, I will increase Synthroid to 150 mcg daily.  I would still like her to follow-up with endocrinologist next month.

## 2020-12-03 NOTE — Assessment & Plan Note (Addendum)
Discussed labs with patient-hemoglobin A1c is no longer in the prediabetes range.  Congratulated on this great success-continue closely watching and limiting simple sugar intake and carbohydrate intake.  I encouraged increasing physical activity-goal is 30 minutes 5 times weekly.

## 2020-12-03 NOTE — Assessment & Plan Note (Signed)
Chronic.  PHQ is not elevated today.  Reports mood is stable with Wellbutrin XL 300 mg daily.  We will continue current medication and follow-up in 6 months.

## 2020-12-03 NOTE — Assessment & Plan Note (Signed)
Chronic, stable.  Takes Adderall XR 20 mg daily sparingly.  Requesting refill today-refill given.  Follow-up in 3 months.

## 2020-12-03 NOTE — Assessment & Plan Note (Signed)
Chronic.  Reviewed labs-lipid profile shows total cholesterol and LDL have improved from last check.  We will plan to continue rosuvastatin 5 mg daily.  Follow-up in 6 months.

## 2020-12-03 NOTE — Assessment & Plan Note (Signed)
Chronic.  Blood pressure is borderline elevated today.  However, given patient's back pain, I suspect this may be contributing.  Patient reports blood pressures consistently less than 130/80 at home, I advised her to continue checking her blood pressure and notify us if the readings are greater than 130/80.  Continue lisinopril 5 mg a day for now.  Follow-up in 3 months.

## 2020-12-03 NOTE — Assessment & Plan Note (Addendum)
Chronic.  PHQ is not elevated today.  Reports mood is stable with Wellbutrin XL 300 mg daily.  We will continue current medication and follow-up in 6 months.

## 2020-12-03 NOTE — Assessment & Plan Note (Signed)
Chronic.  Stable with as needed very seldom use of alprazolam.  PDMP reviewed and appropriate.  Controlled substance agreement has been signed in the past.  Needs UDS.  Refill given today.  Follow-up in 3 months.

## 2020-12-07 ENCOUNTER — Encounter: Payer: Self-pay | Admitting: Gastroenterology

## 2020-12-08 ENCOUNTER — Encounter: Payer: Self-pay | Admitting: Nurse Practitioner

## 2020-12-21 ENCOUNTER — Ambulatory Visit (INDEPENDENT_AMBULATORY_CARE_PROVIDER_SITE_OTHER): Payer: No Typology Code available for payment source | Admitting: Clinical

## 2020-12-21 DIAGNOSIS — F332 Major depressive disorder, recurrent severe without psychotic features: Secondary | ICD-10-CM | POA: Diagnosis not present

## 2021-01-04 ENCOUNTER — Ambulatory Visit (INDEPENDENT_AMBULATORY_CARE_PROVIDER_SITE_OTHER): Payer: No Typology Code available for payment source | Admitting: Clinical

## 2021-01-04 DIAGNOSIS — F332 Major depressive disorder, recurrent severe without psychotic features: Secondary | ICD-10-CM

## 2021-01-11 ENCOUNTER — Ambulatory Visit
Admission: RE | Admit: 2021-01-11 | Discharge: 2021-01-11 | Disposition: A | Discharge status: Home / Self Care | Payer: No Typology Code available for payment source | Source: Ambulatory Visit | Attending: Nurse Practitioner | Admitting: Nurse Practitioner

## 2021-01-11 ENCOUNTER — Other Ambulatory Visit: Payer: Self-pay

## 2021-01-11 DIAGNOSIS — Z13820 Encounter for screening for osteoporosis: Secondary | ICD-10-CM

## 2021-01-23 ENCOUNTER — Other Ambulatory Visit: Payer: Self-pay | Admitting: Nurse Practitioner

## 2021-01-23 DIAGNOSIS — F900 Attention-deficit hyperactivity disorder, predominantly inattentive type: Secondary | ICD-10-CM

## 2021-01-25 MED ORDER — AMPHETAMINE-DEXTROAMPHET ER 20 MG PO CP24: 20.0000 mg | ORAL_CAPSULE | Freq: Every day | ORAL | 0 refills | Status: DC | PRN

## 2021-01-25 NOTE — Telephone Encounter (Signed)
PDMP reviewed and appropriate.  Will discuss with her at upcoming appointment.

## 2021-01-27 ENCOUNTER — Other Ambulatory Visit: Payer: Self-pay

## 2021-01-27 MED ORDER — OMEPRAZOLE 40 MG PO CPDR: DELAYED_RELEASE_CAPSULE | ORAL | 3 refills | Status: AC

## 2021-02-01 ENCOUNTER — Ambulatory Visit (AMBULATORY_SURGERY_CENTER): Payer: No Typology Code available for payment source | Admitting: *Deleted

## 2021-02-01 ENCOUNTER — Other Ambulatory Visit: Payer: Self-pay

## 2021-02-01 VITALS — Ht 67.0 in | Wt 262.0 lb

## 2021-02-01 DIAGNOSIS — Z1211 Encounter for screening for malignant neoplasm of colon: Secondary | ICD-10-CM

## 2021-02-01 MED ORDER — PEG 3350-KCL-NA BICARB-NACL 420 G PO SOLR: 4000.0000 mL | Freq: Once | ORAL | 0 refills | Status: AC

## 2021-02-01 NOTE — Progress Notes (Signed)
Pre visit conducted over telephone Instructions forwarded through secure e-mail [email or web address]    No egg or soy allergy known to patient  No issues known to pt with past sedation with any surgeries or procedures Patient denies ever being told they had issues or difficulty with intubation  No FH of Malignant Hyperthermia Pt is not on diet pills Pt is not on  home 02  Pt is not on blood thinners  Pt denies issues with constipation  No A fib or A flutter  Patient is aware of co-payment associated with purchase of bowel prep.   Due to the COVID-19 pandemic we are asking patients to follow certain guidelines in [place] and the [place]   Pt aware of COVID protocols and [facility] guidelines

## 2021-02-06 ENCOUNTER — Encounter: Payer: Self-pay | Admitting: Gastroenterology

## 2021-02-13 ENCOUNTER — Other Ambulatory Visit: Payer: Self-pay | Admitting: "Endocrinology

## 2021-02-13 DIAGNOSIS — E782 Mixed hyperlipidemia: Secondary | ICD-10-CM

## 2021-02-13 DIAGNOSIS — I10 Essential (primary) hypertension: Secondary | ICD-10-CM

## 2021-02-14 ENCOUNTER — Other Ambulatory Visit: Payer: Self-pay | Admitting: *Deleted

## 2021-02-14 ENCOUNTER — Encounter: Payer: Self-pay | Admitting: Nurse Practitioner

## 2021-02-14 DIAGNOSIS — I10 Essential (primary) hypertension: Secondary | ICD-10-CM

## 2021-02-14 MED ORDER — LISINOPRIL 5 MG PO TABS: 5.0000 mg | ORAL_TABLET | Freq: Every day | ORAL | 1 refills | Status: DC

## 2021-02-15 ENCOUNTER — Other Ambulatory Visit: Payer: Self-pay | Admitting: *Deleted

## 2021-02-15 DIAGNOSIS — I10 Essential (primary) hypertension: Secondary | ICD-10-CM

## 2021-02-15 MED ORDER — LISINOPRIL 5 MG PO TABS: 5.0000 mg | ORAL_TABLET | Freq: Every day | ORAL | 1 refills | Status: AC

## 2021-02-20 ENCOUNTER — Other Ambulatory Visit: Payer: Self-pay

## 2021-02-20 ENCOUNTER — Encounter: Payer: Self-pay | Admitting: Gastroenterology

## 2021-02-20 ENCOUNTER — Ambulatory Visit (AMBULATORY_SURGERY_CENTER): Payer: No Typology Code available for payment source | Admitting: Gastroenterology

## 2021-02-20 VITALS — BP 134/76 | HR 77 | Temp 98.0°F | Resp 16 | Ht 67.0 in | Wt 252.0 lb

## 2021-02-20 DIAGNOSIS — Z1211 Encounter for screening for malignant neoplasm of colon: Secondary | ICD-10-CM

## 2021-02-20 MED ORDER — SODIUM CHLORIDE 0.9 % IV SOLN: 500.0000 mL | Freq: Once | INTRAVENOUS | Status: DC

## 2021-02-20 NOTE — Patient Instructions (Signed)
No polyps!!! Next colonoscopy in 10 years  Please read over handout about diverticulosis and high fiber diets  Continue your normal medications  YOU HAD AN ENDOSCOPIC PROCEDURE TODAY AT [facility]:   Refer to the procedure report that was given to you for any specific questions about what was found during the examination.  If the procedure report does not answer your questions, please call your gastroenterologist to clarify.  If you requested that your care partner not be given the details of your procedure findings, then the procedure report has been included in a sealed envelope for you to review at your convenience later.  YOU SHOULD EXPECT: Some feelings of bloating in the abdomen. Passage of more gas than usual.  Walking can help get rid of the air that was put into your GI tract during the procedure and reduce the bloating. If you had a lower endoscopy (such as a colonoscopy or flexible sigmoidoscopy) you may notice spotting of blood in your stool or on the toilet paper. If you underwent a bowel prep for your procedure, you may not have a normal bowel movement for a few days.  Please Note:  You might notice some irritation and congestion in your nose or some drainage.  This is from the oxygen used during your procedure.  There is no need for concern and it should clear up in a day or so.  SYMPTOMS TO REPORT IMMEDIATELY:  Following lower endoscopy (colonoscopy or flexible sigmoidoscopy):  Excessive amounts of blood in the stool  Significant tenderness or worsening of abdominal pains  Swelling of the abdomen that is new, acute  Fever of 100F or higher  For urgent or emergent issues, a gastroenterologist can be reached at any hour by calling [phone]. Do not use MyChart messaging for urgent concerns.    DIET:  We do recommend a small meal at first, but then you may proceed to your regular diet.  Drink plenty of fluids but you should avoid alcoholic beverages for 24  hours.  ACTIVITY:  You should plan to take it easy for the rest of today and you should NOT DRIVE or use heavy machinery until tomorrow (because of the sedation medicines used during the test).    FOLLOW UP: Our staff will call the number listed on your records 48-72 hours following your procedure to check on you and address any questions or concerns that you may have regarding the information given to you following your procedure. If we do not reach you, we will leave a message.  We will attempt to reach you two times.  During this call, we will ask if you have developed any symptoms of COVID 19. If you develop any symptoms (ie: fever, flu-like symptoms, shortness of breath, cough etc.) before then, please call [phone].  If you test positive for Covid 19 in the 2 weeks post procedure, please call and report this information to [place].     SIGNATURES/CONFIDENTIALITY: You and/or your care partner have signed paperwork which will be entered into your electronic medical record.  These signatures attest to the fact that that the information above on your After Visit Summary has been reviewed and is understood.  Full responsibility of the confidentiality of this discharge information lies with you and/or your care-partner.

## 2021-02-20 NOTE — Progress Notes (Signed)
VS completed by CW.   Pt's states no medical or surgical changes since previsit or office visit.  

## 2021-02-20 NOTE — Op Note (Signed)
[place] Patient Name: Crystal Waters Procedure Date: 02/20/2021 9:11 AM MRN: [id number] Endoscopist: Milus Banister , MD Age: 66 Referring MD:  Date of Birth: [birth year] Gender: Female Account #: [id number] Procedure:                Colonoscopy Indications:              Screening for colorectal malignant neoplasm Medicines:                Monitored Anesthesia Care Procedure:                Pre-Anesthesia Assessment:                           - Prior to the procedure, a History and Physical                            was performed, and patient medications and                            allergies were reviewed. The patient's tolerance of                            previous anesthesia was also reviewed. The risks                            and benefits of the procedure and the sedation                            options and risks were discussed with the patient.                            All questions were answered, and informed consent                            was obtained. Prior Anticoagulants: The patient has                            taken no previous anticoagulant or antiplatelet                            agents. ASA Grade Assessment: II - A patient with                            mild systemic disease. After reviewing the risks                            and benefits, the patient was deemed in                            satisfactory condition to undergo the procedure.                           After obtaining informed consent, the colonoscope  was passed under direct vision. Throughout the                            procedure, the patient's blood pressure, pulse, and                            oxygen saturations were monitored continuously. The                            Olympus CF-HQ190L [id number]) Colonoscope was                            introduced through the anus and advanced to the the                            cecum, identified  by appendiceal orifice and                            ileocecal valve. The colonoscopy was performed                            without difficulty. The patient tolerated the                            procedure well. The quality of the bowel                            preparation was good. The ileocecal valve,                            appendiceal orifice, and rectum were photographed. Scope In: 9:15:24 AM Scope Out: 9:28:03 AM Scope Withdrawal Time: 0 hours 9 minutes 18 seconds  Total Procedure Duration: 0 hours 12 minutes 39 seconds  Findings:                 Multiple small and large-mouthed diverticula were                            found in the left colon.                           The exam was otherwise without abnormality on                            direct and retroflexion views. Complications:            No immediate complications. Estimated blood loss:                            None. Estimated Blood Loss:     Estimated blood loss: none. Impression:               - Diverticulosis in the left colon.                           - The examination was otherwise normal on  direct                            and retroflexion views.                           - No polyps or cancers. Recommendation:           - Patient has a contact number available for                            emergencies. The signs and symptoms of potential                            delayed complications were discussed with the                            patient. Return to normal activities tomorrow.                            Written discharge instructions were provided to the                            patient.                           - Resume previous diet.                           - Continue present medications.                           - Repeat colonoscopy in 10 years for screening. Milus Banister, MD 02/20/2021 9:30:04 AM This report has been signed electronically.

## 2021-02-20 NOTE — Progress Notes (Signed)
PT taken to PACU. Monitors in place. VSS. Report given to RN. 

## 2021-02-20 NOTE — Progress Notes (Signed)
HPI: This is a woman at routine risk for CRC   ROS: complete GI ROS as described in HPI, all other review negative.  Constitutional:  No unintentional weight loss   Past Medical History:  Diagnosis Date   Allergy    gluten, seasonal   Anxiety    Arthritis    Asthma    seasonal    Cancer ([place])    Thyroid   Complication of anesthesia    Constipation    Depression    GERD (gastroesophageal reflux disease)    Gestational diabetes mellitus    with 1 child.     H/O nose injury    accidently hit in the nose, has a stuffy nose, can't lie flat, wears nasal strips at night   History of kidney stones      x 2 passed   Hyperlipidemia    Hypertension    Hypothyroidism    Kidney stones    PONV (postoperative nausea and vomiting)    PTSD (post-traumatic stress disorder)    Sleep apnea    Thyroid cancer ([place])    Thyroid disease     Past Surgical History:  Procedure Laterality Date   CHOLECYSTECTOMY N/A 04/11/2016   Procedure: LAPAROSCOPIC CHOLECYSTECTOMY WITH INTRAOPERATIVE CHOLANGIOGRAM;  Surgeon: Georganna Skeans, MD;  Location: [facility];  Service: General;  Laterality: N/A;   COLONOSCOPY     KNEE SURGERY Right 2009   LAPAROTOMY N/A 04/17/2016   Procedure: LAPAROTOMY REPAIR OF INCARCERATED [place];  Surgeon: Rolm Bookbinder, MD;  Location: [facility];  Service: General;  Laterality: N/A;   REPLACEMENT TOTAL KNEE Bilateral 2021   THYROID SURGERY     2001   TUBAL LIGATION      Current Outpatient Medications  Medication Sig Dispense Refill   amphetamine-dextroamphetamine (ADDERALL XR) 20 MG 24 hr capsule Take 1 capsule (20 mg total) by mouth daily as needed (ADHD). 30 capsule 0   buPROPion (WELLBUTRIN XL) 300 MG 24 hr tablet TAKE 1 TABLET BY MOUTH EVERY DAY 90 tablet 1   Cholecalciferol (VITAMIN D) 2000 UNITS tablet Take 2,000 Units by mouth daily.     gabapentin (NEURONTIN) 300 MG capsule Take 300 mg by mouth daily.     lisinopril (ZESTRIL) 5 MG tablet  Take 1 tablet (5 mg total) by mouth daily. 90 tablet 1   omeprazole (PRILOSEC) 40 MG capsule TAKE 1 CAPSULE BY MOUTH EVERY DAY 90 capsule 3   ondansetron (ZOFRAN ODT) 4 MG disintegrating tablet Take 1 tablet (4 mg total) by mouth every 8 (eight) hours as needed for nausea or vomiting. 20 tablet 2   Probiotic Product (ALIGN) 4 MG CAPS Take 1 capsule by mouth daily.     rosuvastatin (CRESTOR) 10 MG tablet Take 0.5 tablets (5 mg total) by mouth daily. 90 tablet 1   SYNTHROID 150 MCG tablet Take 1 tablet (150 mcg total) by mouth daily before breakfast. 90 tablet 0   acetaminophen (TYLENOL) 500 MG tablet Take 500 mg every 8 (eight) hours as needed by mouth.     ALPRAZolam (XANAX) 0.5 MG tablet Take 1 tablet (0.5 mg total) by mouth daily as needed for anxiety. 45 tablet 0   clotrimazole-betamethasone (LOTRISONE) cream Apply 1 application topically 2 (two) times daily as needed. 60 g 1   diclofenac Sodium (VOLTAREN) 1 % GEL APPLY AS IDRECTED 100 g 2   ibuprofen (ADVIL,MOTRIN) 200 MG tablet Take 400 mg by mouth every 8 (eight) hours as needed for mild pain  or moderate pain.     OVER THE COUNTER MEDICATION Nasal spay qhs     Current Facility-Administered Medications  Medication Dose Route Frequency Provider Last Rate Last Admin   0.9 %  sodium chloride infusion  500 mL Intravenous Once Milus Banister, MD        Allergies as of 02/20/2021 - Review Complete 02/20/2021  Allergen Reaction Noted   Gluten meal Rash 04/10/2016   Levothyroxine Other (See Comments) 03/23/2020   Morphine and related Nausea Only 02/01/2021   Zoloft [sertraline hcl] Other (See Comments) 01/13/2013    Family History  Problem Relation Age of Onset   Heart disease Mother    Diabetes Father    Heart disease Father    Hyperlipidemia Father    Hypertension Father    Diabetes Brother    Autism Son    Lupus Maternal Grandmother    Heart attack Maternal Grandfather    Cancer Paternal Grandmother    Kidney Stones Sister     Colon cancer Neg Hx    Rectal cancer Neg Hx    Esophageal cancer Neg Hx    Liver cancer Neg Hx    Stomach cancer Neg Hx     Social History   Socioeconomic History   Marital status: Divorced    Spouse name: Not on file   Number of children: 2   Years of education: Not on file   Highest education level: Not on file  Occupational History   Occupation: lead teller  Tobacco Use   Smoking status: Former   Smokeless tobacco: Never   Tobacco comments:    social smoker- never a habit  Scientific laboratory technician Use: Never used  Substance and Sexual Activity   Alcohol use: Yes    Comment: occasionally    Drug use: No   Sexual activity: Not Currently  Other Topics Concern   Not on file  Social History Narrative   Not on file   Social Determinants of Health   Financial Resource Strain: Not on file  Food Insecurity: Not on file  Transportation Needs: Not on file  Physical Activity: Not on file  Stress: Not on file  Social Connections: Not on file  Intimate Partner Violence: Not on file     Physical Exam: BP (!) 129/57   Pulse 82   Temp 98 F (36.7 C) (Temporal)   Ht 5\' 7"  (1.702 m)   Wt 252 lb (114.3 kg)   SpO2 98%   BMI 39.47 kg/m  Constitutional: generally well-appearing Psychiatric: alert and oriented x3 Lungs: CTA bilaterally Heart: no MCR  Assessment and plan: 65 y.o. female with routine risk for CRC  Colonoscoyp today  Care is appropriate for the ambulatory setting.  Owens Loffler, MD [facility] 02/20/2021, 9:04 AM

## 2021-02-22 ENCOUNTER — Encounter: Payer: Self-pay | Admitting: Nurse Practitioner

## 2021-02-22 ENCOUNTER — Telehealth: Payer: Self-pay | Admitting: *Deleted

## 2021-02-22 DIAGNOSIS — E890 Postprocedural hypothyroidism: Secondary | ICD-10-CM | POA: Diagnosis not present

## 2021-02-22 DIAGNOSIS — Z85850 Personal history of malignant neoplasm of thyroid: Secondary | ICD-10-CM | POA: Diagnosis not present

## 2021-02-22 NOTE — Telephone Encounter (Signed)
  Follow up Call-  Call back number 02/20/2021 06/09/2018  Post procedure Call Back phone  # [phone]  Permission to leave phone message Yes Yes  Some recent data might be hidden     Patient questions:  Do you have a fever, pain , or abdominal swelling? No. Pain Score  0 *  Have you tolerated food without any problems? Yes.    Have you been able to return to your normal activities? Yes.    Do you have any questions about your discharge instructions: Diet   No. Medications  No. Follow up visit  No.  Do you have questions or concerns about your Care? No.  Actions: * If pain score is 4 or above: No action needed, pain <4.

## 2021-02-22 NOTE — Telephone Encounter (Signed)
First follow up call attempt.  LVM. 

## 2021-02-23 ENCOUNTER — Other Ambulatory Visit: Payer: Self-pay

## 2021-02-23 DIAGNOSIS — Z1211 Encounter for screening for malignant neoplasm of colon: Secondary | ICD-10-CM

## 2021-03-01 ENCOUNTER — Ambulatory Visit: Payer: No Typology Code available for payment source | Admitting: Clinical

## 2021-03-01 ENCOUNTER — Ambulatory Visit: Payer: No Typology Code available for payment source | Admitting: Nurse Practitioner

## 2021-03-08 ENCOUNTER — Other Ambulatory Visit: Payer: Self-pay

## 2021-03-08 ENCOUNTER — Ambulatory Visit (INDEPENDENT_AMBULATORY_CARE_PROVIDER_SITE_OTHER): Payer: Medicare Other | Admitting: Nurse Practitioner

## 2021-03-08 VITALS — BP 138/78 | HR 81 | Ht 67.0 in | Wt 259.0 lb

## 2021-03-08 DIAGNOSIS — Z124 Encounter for screening for malignant neoplasm of cervix: Secondary | ICD-10-CM

## 2021-03-08 DIAGNOSIS — F331 Major depressive disorder, recurrent, moderate: Secondary | ICD-10-CM | POA: Diagnosis not present

## 2021-03-08 DIAGNOSIS — I10 Essential (primary) hypertension: Secondary | ICD-10-CM

## 2021-03-08 DIAGNOSIS — E890 Postprocedural hypothyroidism: Secondary | ICD-10-CM

## 2021-03-08 DIAGNOSIS — F900 Attention-deficit hyperactivity disorder, predominantly inattentive type: Secondary | ICD-10-CM

## 2021-03-08 DIAGNOSIS — E782 Mixed hyperlipidemia: Secondary | ICD-10-CM

## 2021-03-08 DIAGNOSIS — F411 Generalized anxiety disorder: Secondary | ICD-10-CM | POA: Diagnosis not present

## 2021-03-08 NOTE — Progress Notes (Signed)
Subjective:    Patient ID: Crystal Waters, female    DOB: November 23, 1955, 65 y.o.   MRN: [id number]  HPI: Crystal Waters is a 65 y.o. female presenting for follow-up.  Chief Complaint  Patient presents with   Depression   HYPERTENSION / HYPERLIPIDEMIA Currently taking lisinopril 5 mg daily.  Also taking Crestor 5 mg most days -about 5 out of 7.  BP monitoring frequency: not checking Aspirin: no Recent stressors: no Recurrent headaches: no Visual changes: no Palpitations: no Dyspnea: no Chest pain: no Lower extremity edema: no Dizzy/lightheaded: no Myalgias: no LDL goal: Less than 100 BP goal: Less than 140/90 The 10-year ASCVD risk score (Arnett DK, et al., 2019) is: 6.9%   Values used to calculate the score:     Age: 34 years     Sex: Female     Is Non-Hispanic African American: No     Diabetic: No     Tobacco smoker: No     Systolic Blood Pressure: 229 mmHg     Is BP treated: Yes     HDL Cholesterol: 78 mg/dL     Total Cholesterol: 172 mg/dL   HYPOTHYROIDISM Currently taking Synthyroid 137 mcg on Sunday and Wednesday and taking 150 mcg evry other day.  Follows with Dr. Buddy Duty, next appointment is next year.  Recently had labs done with endocrinology. Thyroid control status:stable Satisfied with current treatment? yes Medication side effects: no Medication compliance: excellent compliance Fatigue: no Cold intolerance: no Heat intolerance: no Weight gain: no Weight loss: no Constipation: no Diarrhea/loose stools: yes Palpitations: no Lower extremity edema: no Anxiety/depressed mood: no  DEPRESSION Currently taking bupropion 300 mg daily.  Reports mood is stable.  She has been stable on this medication for a while. Mood status: stable Satisfied with current treatment?: yes Symptom severity: mild  Duration of current treatment : chronic Side effects: no Medication compliance: excellent compliance  Depression screen [facility] 2/9 03/09/2021 11/30/2020 11/23/2020  08/24/2020 12/07/2019  Decreased Interest 1 0 1 0 0  Down, Depressed, Hopeless 1 0 1 0 0  PHQ - 2 Score 2 0 2 0 0  Altered sleeping 3 - 3 3 2   Tired, decreased energy 2 - 2 3 1   Change in appetite 2 - 2 1 0  Feeling bad or failure about yourself  0 - 0 0 0  Trouble concentrating 2 - 2 1 2   Moving slowly or fidgety/restless 0 - 1 0 0  Suicidal thoughts 0 - 0 0 0  PHQ-9 Score 11 - 12 8 5   Difficult doing work/chores Not difficult at all - Somewhat difficult Somewhat difficult Not difficult at all  Some recent data might be hidden    GAD 7 : Generalized Anxiety Score 03/09/2021 08/24/2020 12/09/2018 10/14/2018  Nervous, Anxious, on Edge 2 1 2 2   Control/stop worrying 1 0 2 2  Worry too much - different things 1 1 2 2   Trouble relaxing 1 1 1 2   Restless 0 1 1 2   Easily annoyed or irritable 1 1 1 2   Afraid - awful might happen 1 0 1 2  Total GAD 7 Score 7 5 10 14   Anxiety Difficulty Somewhat difficult Somewhat difficult Somewhat difficult Very difficult   ADHD Currently taking Adderall XR 20 mg daily as needed.  She takes this most days when she works.  This is about 5 out of 7 days/week.  She sometimes forgets to take it.  It does help with her symptoms  when she takes it. ADHD status: stable Satisfied with current therapy: yes Medication compliance:  good compliance Controlled substance contract: yes Previous psychiatry evaluation: no Taking meds on weekends/vacations: no Work/school performance:  good Difficulty sustaining attention/completing tasks: yes Distracted by extraneous stimuli: yes Does not listen when spoken to: no  Fidgets with hands or feet: no Unable to stay in seat: no Blurts out/interrupts others: yes ADHD Medication Side Effects: no    Decreased appetite: no    Headache: no    Sleeping disturbance pattern: no -had this before medication    Irritability: no    Rebound effects (worse than baseline) off medication: no    Anxiousness: no    Dizziness: no    Tics:  no  She is also due for cervical cancer screening.  Her last 2 Pap smears have been negative.  She denies any vaginal discharge or complaints today.  Allergies  Allergen Reactions   Gluten Meal Rash   Levothyroxine Other (See Comments)    Rash, stomach "gnawing"   Morphine And Related Nausea Only   Zoloft [Sertraline Hcl] Other (See Comments)    STOMACH ACHES    Outpatient Encounter Medications as of 03/08/2021  Medication Sig   acetaminophen (TYLENOL) 500 MG tablet Take 500 mg every 8 (eight) hours as needed by mouth.   ALPRAZolam (XANAX) 0.5 MG tablet Take 1 tablet (0.5 mg total) by mouth daily as needed for anxiety.   buPROPion (WELLBUTRIN XL) 300 MG 24 hr tablet TAKE 1 TABLET BY MOUTH EVERY DAY   Cholecalciferol (VITAMIN D) 2000 UNITS tablet Take 2,000 Units by mouth daily.   clotrimazole-betamethasone (LOTRISONE) cream Apply 1 application topically 2 (two) times daily as needed.   diclofenac Sodium (VOLTAREN) 1 % GEL APPLY AS IDRECTED   gabapentin (NEURONTIN) 300 MG capsule Take 300 mg by mouth daily.   ibuprofen (ADVIL,MOTRIN) 200 MG tablet Take 400 mg by mouth every 8 (eight) hours as needed for mild pain or moderate pain.   lisinopril (ZESTRIL) 5 MG tablet Take 1 tablet (5 mg total) by mouth daily.   omeprazole (PRILOSEC) 40 MG capsule TAKE 1 CAPSULE BY MOUTH EVERY DAY   ondansetron (ZOFRAN ODT) 4 MG disintegrating tablet Take 1 tablet (4 mg total) by mouth every 8 (eight) hours as needed for nausea or vomiting.   OVER THE COUNTER MEDICATION Nasal spay qhs   Probiotic Product (ALIGN) 4 MG CAPS Take 1 capsule by mouth daily.   rosuvastatin (CRESTOR) 10 MG tablet Take 0.5 tablets (5 mg total) by mouth daily.   SYNTHROID 150 MCG tablet Take 1 tablet (150 mcg total) by mouth daily before breakfast.   [DISCONTINUED] amphetamine-dextroamphetamine (ADDERALL XR) 20 MG 24 hr capsule Take 1 capsule (20 mg total) by mouth daily as needed (ADHD).   amphetamine-dextroamphetamine (ADDERALL  XR) 20 MG 24 hr capsule Take 1 capsule (20 mg total) by mouth daily as needed (ADHD).   [START ON 04/09/2021] amphetamine-dextroamphetamine (ADDERALL XR) 20 MG 24 hr capsule Take 1 capsule (20 mg total) by mouth daily as needed (ADHD).   No facility-administered encounter medications on file as of 03/08/2021.    Patient Active Problem List   Diagnosis Date Noted   Controlled substance agreement signed 08/25/2020   Mixed hyperlipidemia 05/11/2020   Stress at work 11/11/2018   Candidiasis of skin 09/12/2018   ADD (attention deficit disorder) 05/06/2018   Postsurgical hypothyroidism 09/18/2017   History of thyroid cancer 09/18/2017   GERD (gastroesophageal reflux disease) 12/24/2016   Incarcerated  incisional hernia 04/18/2016   Left shoulder pain 10/03/2015   Prediabetes 02/07/2015   Atypical nevi 10/29/2013   OA (osteoarthritis) of knee 01/15/2013   Class 2 obesity 01/15/2013   Essential hypertension, benign 01/15/2013   Chronic insomnia 01/15/2013   Major depressive disorder 01/15/2013   GAD (generalized anxiety disorder) 01/15/2013    Past Medical History:  Diagnosis Date   Allergy    gluten, seasonal   Anxiety    Arthritis    Asthma    seasonal    Cancer ([place])    Thyroid   Complication of anesthesia    Constipation    Depression    GERD (gastroesophageal reflux disease)    Gestational diabetes mellitus    with 1 child.     H/O nose injury    accidently hit in the nose, has a stuffy nose, can't lie flat, wears nasal strips at night   History of kidney stones      x 2 passed   Hyperlipidemia    Hypertension    Hypothyroidism    Kidney stones    PONV (postoperative nausea and vomiting)    PTSD (post-traumatic stress disorder)    Sleep apnea    Thyroid cancer ([place])    Thyroid disease     Relevant past medical, surgical, family and social history reviewed and updated as indicated. Interim medical history since our last visit reviewed.  Review of Systems Per  HPI unless specifically indicated above     Objective:    BP 138/78    Pulse 81    Ht 5\' 7"  (1.702 m)    Wt 259 lb (117.5 kg)    SpO2 98%    BMI 40.57 kg/m   Wt Readings from Last 3 Encounters:  03/08/21 259 lb (117.5 kg)  02/20/21 252 lb (114.3 kg)  02/01/21 262 lb (118.8 kg)    Physical Exam Vitals and nursing note reviewed. Exam conducted with a chaperone present Elizabeth Palau, CMA).  Constitutional:      General: She is not in acute distress.    Appearance: Normal appearance. She is obese. She is not ill-appearing or toxic-appearing.  HENT:     Head: Normocephalic and atraumatic.  Eyes:     General: No scleral icterus.    Extraocular Movements: Extraocular movements intact.     Pupils: Pupils are equal, round, and reactive to light.  Cardiovascular:     Rate and Rhythm: Normal rate and regular rhythm.     Pulses: Normal pulses.     Heart sounds: Normal heart sounds. No murmur heard. Pulmonary:     Effort: Pulmonary effort is normal. No respiratory distress.     Breath sounds: No wheezing or rhonchi.  Chest:  Breasts:    Right: Normal. No inverted nipple, mass, nipple discharge or skin change.     Left: Normal. No inverted nipple, mass, nipple discharge or skin change.  Abdominal:     General: Abdomen is flat. Bowel sounds are normal. There is no distension.     Palpations: Abdomen is soft.     Tenderness: There is no abdominal tenderness.  Genitourinary:    General: Normal vulva.     Exam position: Lithotomy position.     Vagina: Normal.     Cervix: Normal.     Uterus: Normal.      Adnexa: Right adnexa normal and left adnexa normal.  Musculoskeletal:        General: No swelling or tenderness. Normal range of motion.  Cervical back: Normal range of motion and neck supple. No rigidity or tenderness.     Right lower leg: No edema.     Left lower leg: No edema.  Lymphadenopathy:     Lower Body: No right inguinal adenopathy. No left inguinal adenopathy.   Skin:    General: Skin is warm and dry.     Capillary Refill: Capillary refill takes less than 2 seconds.     Coloration: Skin is not jaundiced or pale.  Neurological:     Mental Status: She is alert and oriented to person, place, and time.     Motor: No weakness.     Gait: Gait normal.  Psychiatric:        Mood and Affect: Mood normal.        Behavior: Behavior normal.        Thought Content: Thought content normal.        Judgment: Judgment normal.       Assessment & Plan:   Problem List Items Addressed This Visit       Cardiovascular and Mediastinum   Essential hypertension, benign    Chronic.  Blood pressure elevated above goal of less than 130/80 today in clinic.  I have asked the patient to monitor her blood pressure at home and report readings to me in 2 weeks.  I also gave her a blood pressure log.  Plan to continue lisinopril 5 mg daily for now, however if blood pressure remains elevated at home, we can increase lisinopril to 10 mg daily.  Recent kidney function with electrolytes are normal.  Follow-up 3 months.        Endocrine   Postsurgical hypothyroidism    Chronic.  Follows with endocrinology-continue collaboration.  She brought recent labs with her-I will make sure we scan these into her chart.        Other   Mixed hyperlipidemia    Chronic.  She is taking Crestor 5 mg about 5 days/week.  Last LDL was less than 100, which is at goal.  Plan to continue current medication.  Follow-up for fasting blood work in 3 months.      Major depressive disorder - Primary    Chronic.  PHQ-9 and GAD-7 are mildly elevated today, however patient reports mood is stable with Wellbutrin XL 300 mg daily.  No SI/HI.  Plan to continue current medication and follow-up 3 months or sooner if needed.      GAD (generalized anxiety disorder)    Chronic.  PHQ-9 and GAD-7 are mildly elevated today, however patient reports mood is stable with Wellbutrin XL 300 mg daily.  No SI/HI.  She has  plenty of alprazolam on hand that she uses extremely sparingly.  Plan to continue current medication and follow-up 3 months or sooner if needed.      ADD (attention deficit disorder)    Chronic, stable.  Continues on Adderall XR to 20 mg daily as needed.  We will give refill today.  She has signed a controlled substance agreement in the past.  UDS next visit.  Follow-up 3 months.      Relevant Medications   amphetamine-dextroamphetamine (ADDERALL XR) 20 MG 24 hr capsule   amphetamine-dextroamphetamine (ADDERALL XR) 20 MG 24 hr capsule (Start on 04/09/2021)   Other Visit Diagnoses     Screening for cervical cancer       If this Pap smear comes back normal, as her previous 2 have, we can discontinue cervical cancer screenings.  Relevant Orders   PAP,TP IMGw/HPV RNA,rflx [id number],38/10        Follow up plan: Return in about 3 months (around 06/06/2021) for Hypertension, hyperlipidemia, ADHD, mood.

## 2021-03-09 ENCOUNTER — Encounter: Payer: Self-pay | Admitting: Nurse Practitioner

## 2021-03-09 MED ORDER — AMPHETAMINE-DEXTROAMPHET ER 20 MG PO CP24: 20.0000 mg | ORAL_CAPSULE | Freq: Every day | ORAL | 0 refills | Status: AC | PRN

## 2021-03-09 NOTE — Assessment & Plan Note (Signed)
Chronic.  PHQ-9 and GAD-7 are mildly elevated today, however patient reports mood is stable with Wellbutrin XL 300 mg daily.  No SI/HI.  Plan to continue current medication and follow-up 3 months or sooner if needed.

## 2021-03-09 NOTE — Assessment & Plan Note (Signed)
Chronic, stable.  Continues on Adderall XR to 20 mg daily as needed.  We will give refill today.  She has signed a controlled substance agreement in the past.  UDS next visit.  Follow-up 3 months.

## 2021-03-09 NOTE — Assessment & Plan Note (Signed)
Chronic.  PHQ-9 and GAD-7 are mildly elevated today, however patient reports mood is stable with Wellbutrin XL 300 mg daily.  No SI/HI.  She has plenty of alprazolam on hand that she uses extremely sparingly.  Plan to continue current medication and follow-up 3 months or sooner if needed.

## 2021-03-09 NOTE — Assessment & Plan Note (Signed)
Chronic.  Blood pressure elevated above goal of less than 130/80 today in clinic.  I have asked the patient to monitor her blood pressure at home and report readings to me in 2 weeks.  I also gave her a blood pressure log.  Plan to continue lisinopril 5 mg daily for now, however if blood pressure remains elevated at home, we can increase lisinopril to 10 mg daily.  Recent kidney function with electrolytes are normal.  Follow-up 3 months.

## 2021-03-09 NOTE — Assessment & Plan Note (Addendum)
Chronic.  She is taking Crestor 5 mg about 5 days/week.  Last LDL was less than 100, which is at goal.  Plan to continue current medication.  Follow-up for fasting blood work in 3 months.

## 2021-03-09 NOTE — Assessment & Plan Note (Signed)
Chronic.  Follows with endocrinology-continue collaboration.  She brought recent labs with her-I will make sure we scan these into her chart.

## 2021-03-10 LAB — PAP, TP IMAGING W/ HPV RNA, RFLX HPV TYPE 16,18/45: HPV DNA High Risk: NOT DETECTED

## 2021-04-12 ENCOUNTER — Other Ambulatory Visit: Payer: Self-pay

## 2021-04-12 ENCOUNTER — Ambulatory Visit (INDEPENDENT_AMBULATORY_CARE_PROVIDER_SITE_OTHER): Payer: Medicare Other | Admitting: Nurse Practitioner

## 2021-04-12 ENCOUNTER — Encounter: Payer: Self-pay | Admitting: Nurse Practitioner

## 2021-04-12 VITALS — BP 142/82 | HR 75 | Ht 67.0 in | Wt 260.0 lb

## 2021-04-12 DIAGNOSIS — M170 Bilateral primary osteoarthritis of knee: Secondary | ICD-10-CM

## 2021-04-12 DIAGNOSIS — R42 Dizziness and giddiness: Secondary | ICD-10-CM | POA: Diagnosis not present

## 2021-04-12 MED ORDER — GABAPENTIN 300 MG PO CAPS: 300.0000 mg | ORAL_CAPSULE | Freq: Every day | ORAL | 1 refills | Status: DC | PRN

## 2021-04-12 MED ORDER — ONDANSETRON 4 MG PO TBDP: 4.0000 mg | ORAL_TABLET | Freq: Three times a day (TID) | ORAL | 0 refills | Status: AC | PRN

## 2021-04-12 NOTE — Progress Notes (Signed)
Subjective:    Patient ID: Crystal Waters, female    DOB: [birth year], 66 y.o.   MRN: [id number]  HPI: Crystal Waters is a 66 y.o. female presenting for dizziness.  Chief Complaint  Patient presents with   Dizziness   DIZZINESS Duration: less than 1 week  Description of symptoms: lightheaded, nauseous Duration of episode: minutes Dizziness frequency:  recurrent; happened in the past not every day Aggravating/provoking factors:  changing positions, looking for something under the bed Triggered by rolling over in bed: no Triggered by bending over: yes Aggravated by head movement: no Aggravated by exertion: no Aggravated by coughing: no Aggravated by loud noises: no Recent head injury: no Recent or current viral symptoms: no History of vasovagal episodes: no Nausea: yes Vomiting: no Tinnitus: no Hearing loss: no Aural fullness: no Headache: no Photophobia: no Phonophobia: no Unsteady gait: yes Postural instability: no Diplopia: no Dysarthria: no Dysphagia: no Weakness: no Related to exertion: no Pallor: no Diaphoresis: no Dyspnea: no Chest pain: no  Patient is asking for refill of ondansetron; she takes as needed for nausea.    Also requesting refill of gabapentin; she seldom takes this for knee pain at night time.  Allergies  Allergen Reactions   Gluten Meal Rash   Levothyroxine Other (See Comments)    Rash, stomach "gnawing"   Morphine And Related Nausea Only   Zoloft [Sertraline Hcl] Other (See Comments)    STOMACH ACHES    Outpatient Encounter Medications as of 04/12/2021  Medication Sig   acetaminophen (TYLENOL) 500 MG tablet Take 500 mg every 8 (eight) hours as needed by mouth.   ALPRAZolam (XANAX) 0.5 MG tablet Take 1 tablet (0.5 mg total) by mouth daily as needed for anxiety.   amphetamine-dextroamphetamine (ADDERALL XR) 20 MG 24 hr capsule Take 1 capsule (20 mg total) by mouth daily as needed (ADHD).   amphetamine-dextroamphetamine  (ADDERALL XR) 20 MG 24 hr capsule Take 1 capsule (20 mg total) by mouth daily as needed (ADHD).   buPROPion (WELLBUTRIN XL) 300 MG 24 hr tablet TAKE 1 TABLET BY MOUTH EVERY DAY   Cholecalciferol (VITAMIN D) 2000 UNITS tablet Take 2,000 Units by mouth daily.   clotrimazole-betamethasone (LOTRISONE) cream Apply 1 application topically 2 (two) times daily as needed.   diclofenac Sodium (VOLTAREN) 1 % GEL APPLY AS IDRECTED   ibuprofen (ADVIL,MOTRIN) 200 MG tablet Take 400 mg by mouth every 8 (eight) hours as needed for mild pain or moderate pain.   lisinopril (ZESTRIL) 5 MG tablet Take 1 tablet (5 mg total) by mouth daily.   omeprazole (PRILOSEC) 40 MG capsule TAKE 1 CAPSULE BY MOUTH EVERY DAY   OVER THE COUNTER MEDICATION Nasal spay qhs   Probiotic Product (ALIGN) 4 MG CAPS Take 1 capsule by mouth daily.   rosuvastatin (CRESTOR) 10 MG tablet Take 0.5 tablets (5 mg total) by mouth daily.   SYNTHROID 150 MCG tablet Take 1 tablet (150 mcg total) by mouth daily before breakfast.   [DISCONTINUED] gabapentin (NEURONTIN) 300 MG capsule Take 300 mg by mouth daily.   [DISCONTINUED] ondansetron (ZOFRAN ODT) 4 MG disintegrating tablet Take 1 tablet (4 mg total) by mouth every 8 (eight) hours as needed for nausea or vomiting.   gabapentin (NEURONTIN) 300 MG capsule Take 1 capsule (300 mg total) by mouth daily as needed.   ondansetron (ZOFRAN ODT) 4 MG disintegrating tablet Take 1 tablet (4 mg total) by mouth every 8 (eight) hours as needed for nausea or vomiting.  No facility-administered encounter medications on file as of 04/12/2021.    Patient Active Problem List   Diagnosis Date Noted   Controlled substance agreement signed 08/25/2020   Mixed hyperlipidemia 05/11/2020   Stress at work 11/11/2018   Candidiasis of skin 09/12/2018   ADD (attention deficit disorder) 05/06/2018   Postsurgical hypothyroidism 09/18/2017   History of thyroid cancer 09/18/2017   GERD (gastroesophageal reflux disease)  12/24/2016   Incarcerated incisional hernia 04/18/2016   Left shoulder pain 10/03/2015   Prediabetes 02/07/2015   Atypical nevi 10/29/2013   OA (osteoarthritis) of knee 01/15/2013   Class 2 obesity 01/15/2013   Essential hypertension, benign 01/15/2013   Chronic insomnia 01/15/2013   Major depressive disorder 01/15/2013   GAD (generalized anxiety disorder) 01/15/2013    Past Medical History:  Diagnosis Date   Allergy    gluten, seasonal   Anxiety    Arthritis    Asthma    seasonal    Cancer ([place])    Thyroid   Complication of anesthesia    Constipation    Depression    GERD (gastroesophageal reflux disease)    Gestational diabetes mellitus    with 1 child.     H/O nose injury    accidently hit in the nose, has a stuffy nose, can't lie flat, wears nasal strips at night   History of kidney stones      x 2 passed   Hyperlipidemia    Hypertension    Hypothyroidism    Kidney stones    PONV (postoperative nausea and vomiting)    PTSD (post-traumatic stress disorder)    Sleep apnea    Thyroid cancer ([place])    Thyroid disease     Relevant past medical, surgical, family and social history reviewed and updated as indicated. Interim medical history since our last visit reviewed.  Review of Systems Per HPI unless specifically indicated above     Objective:    BP (!) 142/82    Pulse 75    Ht 5\' 7"  (1.702 m)    Wt 260 lb (117.9 kg)    SpO2 97%    BMI 40.72 kg/m   Wt Readings from Last 3 Encounters:  04/12/21 260 lb (117.9 kg)  03/08/21 259 lb (117.5 kg)  02/20/21 252 lb (114.3 kg)    Orthostatic vital signs: Laying: 138/80; HR 75 Sitting: 150/82; HR 76 Standing: 154/84; HR 79  Physical Exam Vitals and nursing note reviewed.  Constitutional:      General: She is not in acute distress.    Appearance: Normal appearance. She is obese. She is not toxic-appearing.  HENT:     Head: Normocephalic and atraumatic.  Eyes:     General: No scleral icterus.    Extraocular  Movements: Extraocular movements intact.  Neck:     Vascular: No carotid bruit.  Cardiovascular:     Rate and Rhythm: Normal rate and regular rhythm.     Heart sounds: Normal heart sounds. No murmur heard. Pulmonary:     Effort: Pulmonary effort is normal. No respiratory distress.     Breath sounds: Normal breath sounds. No wheezing, rhonchi or rales.  Musculoskeletal:     Cervical back: Normal range of motion.     Right lower leg: No edema.     Left lower leg: No edema.  Skin:    General: Skin is warm and dry.     Capillary Refill: Capillary refill takes less than 2 seconds.     Coloration: Skin  is not jaundiced or pale.     Findings: No erythema.  Neurological:     Mental Status: She is alert and oriented to person, place, and time.     Motor: No weakness.     Gait: Gait normal.  Psychiatric:        Mood and Affect: Mood normal.        Behavior: Behavior normal.        Thought Content: Thought content normal.        Judgment: Judgment normal.      Assessment & Plan:  1. Dizziness Physical examination today is normal.  EKG remains largely unchanged from previous.  I suspect orthostatic hypotension as dizziness is only with position changes.  Orthostatic vital signs today are normal.  I encouraged her to cut back on caffeine and continue increasing water intake.  Changes positions slowly to prevent dizziness.  We discussed referral to Cardiology, however patient declines for now.    - EKG 12-Lead  2. Primary osteoarthritis of both knees Refill given for gabapentin.     Follow up plan: Return for with new PCP.

## 2021-05-10 ENCOUNTER — Ambulatory Visit (INDEPENDENT_AMBULATORY_CARE_PROVIDER_SITE_OTHER): Payer: Medicare Other | Admitting: Clinical

## 2021-05-10 DIAGNOSIS — F331 Major depressive disorder, recurrent, moderate: Secondary | ICD-10-CM

## 2021-05-10 NOTE — Progress Notes (Signed)
Time: 11:00am-12:00pm CPT Code: 01601U-93 Diagnosis Code: f33.2  Crystal Waters was seen remotely using secure video conferencing. She was in her home and the therapist was in her office at the time of the appointment. She shared that her mood has improved somewhat since her last session. She has taken up crocheting as a hobby, and is looking forward to reducing her hours at work. She also has several weekend trips planned with her son. Therapist encouraged her to continue considering coping strategies. She is scheduled to be seen again in April, and Therapist will reach out as cancellations happen.  Treatment Plan Client Treatment Preferences  Crystal Waters prefers virtual appointments that take place on Wednesdays, which are a day off from work.  Client Statement of Needs  Crystal Waters is seeking cognitive behavioral therapy (CBT) to address work and health related stress, as well as a history of depression.  Treatment Level  Biweekly  Symptoms  depression: depressed mood, difficulty focusing, feelings of exhaustion (Status: maintained).  Problems Addressed  New Description, New Description  Goals 1. Crystal Waters experiences a range of health challenges that cause physical discomfort and stress Objective Crystal Waters will develop a plan for her work life and eventual retirement that she feels good about Target Date: 2021-12-21 Frequency: Monthly  Progress: 0 Modality: individual  Related Interventions Therapist will work with Crystal Waters to consider her options and develop a rewarding plan for her future  Therapist will provide referrals for additional resources as appropriate  Objective Crystal Waters will develop coping strategies to lower her stress level and improve her overall mood  Target Date: 2021-12-21 Frequency: Monthly  Progress: 0 Modality: individual  Related Interventions Crystal Waters will be provided opportunities to process her experiences in session Therapist will help Crystal Waters to identify and  disengage from maladaptive behaviors using CBT based stragies Therapist will work with Crystal Waters to incorporate positive coping strategies into her day-to-day routine Therapist will incorporate behavior activation, including incorporation of structure, mastery events, and pleasant events into [name]'s routine 2. Crystal Waters has a history of recurrent depression. Diagnosis Axis none 296.31 (Major depressive affective disorder, recurrent episode, mild) - Open - [Signifier: n/a]    Conditions For Discharge Achievement of treatment goals and objectives Therapist Signature ___________________________ Patient Signature ___________________________               Crystal Cruise, PhD

## 2021-05-24 ENCOUNTER — Other Ambulatory Visit: Payer: Self-pay | Admitting: Nurse Practitioner

## 2021-05-31 DIAGNOSIS — M130 Polyarthritis, unspecified: Secondary | ICD-10-CM | POA: Diagnosis not present

## 2021-05-31 DIAGNOSIS — C73 Malignant neoplasm of thyroid gland: Secondary | ICD-10-CM | POA: Diagnosis not present

## 2021-05-31 DIAGNOSIS — G629 Polyneuropathy, unspecified: Secondary | ICD-10-CM | POA: Diagnosis not present

## 2021-05-31 DIAGNOSIS — I10 Essential (primary) hypertension: Secondary | ICD-10-CM | POA: Diagnosis not present

## 2021-06-07 ENCOUNTER — Ambulatory Visit: Payer: Medicare Other | Admitting: Nurse Practitioner

## 2021-06-13 ENCOUNTER — Other Ambulatory Visit: Payer: Self-pay | Admitting: Nurse Practitioner

## 2021-06-14 DIAGNOSIS — C73 Malignant neoplasm of thyroid gland: Secondary | ICD-10-CM | POA: Diagnosis not present

## 2021-06-14 DIAGNOSIS — M130 Polyarthritis, unspecified: Secondary | ICD-10-CM | POA: Diagnosis not present

## 2021-06-14 DIAGNOSIS — G629 Polyneuropathy, unspecified: Secondary | ICD-10-CM | POA: Diagnosis not present

## 2021-07-19 ENCOUNTER — Ambulatory Visit (INDEPENDENT_AMBULATORY_CARE_PROVIDER_SITE_OTHER): Payer: Medicare Other | Admitting: Clinical

## 2021-07-19 DIAGNOSIS — F331 Major depressive disorder, recurrent, moderate: Secondary | ICD-10-CM

## 2021-07-19 NOTE — Progress Notes (Signed)
Time: 10:00am-10:54am ?CPT Code: 81017P-10 ?Diagnosis Code: f33.2 ? ?Crystal Waters was seen remotely using secure video conferencing. She was in her home and the therapist was in her office at the time of the appointment. She shared that work related stress had worsened since her last session. To cope, she has tried to schedule outings with her son. Therapist encouraged her to explore options to see if she has any possibility of changing the situation. She is scheduled to be seen again in three weeks. ? ? ? ? ?Treatment Plan ?Client Treatment Preferences  ?Crystal Waters prefers virtual appointments that take place on Wednesdays, which are a day off from work.  ?Client Statement of Needs  ?Crystal Waters is seeking cognitive behavioral therapy (CBT) to address work and health related stress, as well as a history of depression.  ?Treatment Level  ?Biweekly  ?Symptoms  ?depression: depressed mood, difficulty focusing, feelings of exhaustion (Status: maintained).  ?Problems Addressed  ?New Description, New Description  ?Goals ?1. Crystal Waters experiences a range of health challenges that cause physical discomfort and stress ?Objective ?Crystal Waters will develop a plan for her work life and eventual retirement that she feels good about ?Target Date: 2021-12-21 Frequency: Monthly  ?Progress: 0 Modality: individual  ?Related Interventions ?Therapist will work with Crystal Waters to consider her options and develop a rewarding plan for her future  ?Therapist will provide referrals for additional resources as appropriate  ?Objective ?Crystal Waters will develop coping strategies to lower her stress level and improve her overall mood  ?Target Date: 2021-12-21 Frequency: Monthly  ?Progress: 0 Modality: individual  ?Related Interventions ?Crystal Waters will be provided opportunities to process her experiences in session ?Therapist will help Crystal Waters to identify and disengage from maladaptive behaviors using CBT based stragies ?Therapist will work with Crystal Waters to  incorporate positive coping strategies into her day-to-day routine ?Therapist will incorporate behavior activation, including incorporation of structure, mastery events, and pleasant events into Aarna's routine ?2. Crystal Waters has a history of recurrent depression. 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 ? ? ? ? ? ? ? ? ? ? ? ? ? ?Myrtie Cruise, PhD ? ? ? ? ? ? ? ? ? ? ? ? ? ? ?Myrtie Cruise, PhD ?

## 2021-08-16 ENCOUNTER — Ambulatory Visit (INDEPENDENT_AMBULATORY_CARE_PROVIDER_SITE_OTHER): Payer: Medicare Other | Admitting: Clinical

## 2021-08-16 DIAGNOSIS — E1169 Type 2 diabetes mellitus with other specified complication: Secondary | ICD-10-CM | POA: Diagnosis not present

## 2021-08-16 DIAGNOSIS — F331 Major depressive disorder, recurrent, moderate: Secondary | ICD-10-CM

## 2021-08-16 DIAGNOSIS — M130 Polyarthritis, unspecified: Secondary | ICD-10-CM | POA: Diagnosis not present

## 2021-08-16 NOTE — Progress Notes (Signed)
Time: [id number] CPT Code: [id number] Diagnosis Code: f33.2  Crystal Waters was seen remotely using secure video conferencing. She was in her home and the therapist was in her office at the time of the appointment. She shared ongoing frustrations with work and a sense of feeling stuck due to financial and time restrictions. 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 She expressed interest in this idea. She is scheduled to be seen again in three weeks.     Treatment Plan Client Treatment Preferences  Crystal Waters prefers virtual appointments that take place on Wednesdays, which are a day off from work.  Client Statement of Needs  Crystal Waters is seeking cognitive behavioral therapy (CBT) to address work and health related stress, as well as a history of depression.  Treatment Level  Biweekly  Symptoms  depression: depressed mood, difficulty focusing, feelings of exhaustion (Status: maintained).  Problems Addressed  New Description, New Description  Goals 1. Crystal Waters experiences a range of health challenges that cause physical discomfort and stress Objective Crystal Waters will develop a plan for her work life and eventual retirement that she feels good about Target Date: 2021-12-21 Frequency: Monthly  Progress: 0 Modality: individual  Related Interventions Therapist will work with Crystal Waters to consider her options and develop a rewarding plan for her future  Therapist will provide referrals for additional resources as appropriate  Objective Crystal Waters will develop coping strategies to lower her stress level and improve her overall mood  Target Date: 2021-12-21 Frequency: Monthly  Progress: 0 Modality: individual  Related Interventions Crystal Waters will be provided opportunities to process her  experiences in session Therapist will help Crystal Waters to identify and disengage from maladaptive behaviors using CBT based stragies Therapist will work with Crystal Waters to incorporate positive coping strategies into her day-to-day routine Therapist will incorporate behavior activation, including incorporation of structure, mastery events, and pleasant events into Crystal Waters's routine 2. Crystal Waters has a history of recurrent depression. 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

## 2021-08-23 DIAGNOSIS — G629 Polyneuropathy, unspecified: Secondary | ICD-10-CM | POA: Diagnosis not present

## 2021-08-23 DIAGNOSIS — Z0000 Encounter for general adult medical examination without abnormal findings: Secondary | ICD-10-CM | POA: Diagnosis not present

## 2021-08-23 DIAGNOSIS — C73 Malignant neoplasm of thyroid gland: Secondary | ICD-10-CM | POA: Diagnosis not present

## 2021-08-23 DIAGNOSIS — K5909 Other constipation: Secondary | ICD-10-CM | POA: Diagnosis not present

## 2021-08-23 DIAGNOSIS — G4730 Sleep apnea, unspecified: Secondary | ICD-10-CM | POA: Diagnosis not present

## 2021-09-13 ENCOUNTER — Ambulatory Visit: Payer: Medicare Other | Admitting: Clinical

## 2021-09-27 ENCOUNTER — Ambulatory Visit (INDEPENDENT_AMBULATORY_CARE_PROVIDER_SITE_OTHER): Payer: Medicare Other | Admitting: Clinical

## 2021-09-27 ENCOUNTER — Other Ambulatory Visit: Payer: Self-pay | Admitting: "Endocrinology

## 2021-09-27 DIAGNOSIS — E782 Mixed hyperlipidemia: Secondary | ICD-10-CM

## 2021-09-27 DIAGNOSIS — F331 Major depressive disorder, recurrent, moderate: Secondary | ICD-10-CM | POA: Diagnosis not present

## 2021-09-27 DIAGNOSIS — G629 Polyneuropathy, unspecified: Secondary | ICD-10-CM | POA: Diagnosis not present

## 2021-09-27 DIAGNOSIS — G4730 Sleep apnea, unspecified: Secondary | ICD-10-CM | POA: Diagnosis not present

## 2021-09-27 NOTE — Progress Notes (Signed)
Time: 11:04 am-11:58am CPT Code: 23536R-44 Diagnosis Code: f33.2  Aurilla was seen remotely using secure video conferencing. She was in her home and the therapist was in her office at the time of the appointment. Session focused on processing ways in which her current life is different from what she had envisioned. Therapist provided validation and support as she processed events surrounding her divorce and subsequent legal battle. Therapist encouraged her to consider actions she could take to improve her current life situation, and she expressed interest in dating. She is scheduled to be seen again in two weeks, with the plan to further discuss her patterns in relationships with men and to consider her options in terms of dating.     Treatment Plan Client Treatment Preferences  Obera prefers virtual appointments that take place on Wednesdays, which are a day off from work.  Client Statement of Needs  Meagen is seeking cognitive behavioral therapy (CBT) to address work and health related stress, as well as a history of depression.  Treatment Level  Biweekly  Symptoms  depression: depressed mood, difficulty focusing, feelings of exhaustion (Status: maintained).  Problems Addressed  New Description, New Description  Goals 1. Yui experiences a range of health challenges that cause physical discomfort and stress Objective Santia will develop a plan for her work life and eventual retirement that she feels good about Target Date: 2021-12-21 Frequency: Monthly  Progress: 0 Modality: individual  Related Interventions Therapist will work with Nunzio Cory to consider her options and develop a rewarding plan for her future  Therapist will provide referrals for additional resources as appropriate  Objective Ajanee will develop coping strategies to lower her stress level and improve her overall mood  Target Date: 2021-12-21 Frequency: Monthly  Progress: 0 Modality: individual  Related  Interventions Keyona will be provided opportunities to process her experiences in session Therapist will help Desia to identify and disengage from maladaptive behaviors using CBT based stragies Therapist will work with Nunzio Cory to incorporate positive coping strategies into her day-to-day routine Therapist will incorporate behavior activation, including incorporation of structure, mastery events, and pleasant events into Brandalynn's routine 2. Danira has a history of recurrent depression. 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

## 2021-10-11 ENCOUNTER — Ambulatory Visit (INDEPENDENT_AMBULATORY_CARE_PROVIDER_SITE_OTHER): Payer: Medicare Other | Admitting: Clinical

## 2021-10-11 DIAGNOSIS — F331 Major depressive disorder, recurrent, moderate: Secondary | ICD-10-CM | POA: Diagnosis not present

## 2021-10-11 NOTE — Progress Notes (Signed)
Time: 11:04 am-11:59 am CPT Code: 09811B-14 Diagnosis Code: f33.2  Crystal Waters was seen remotely using secure video conferencing. She was in her home and the therapist was in her office at the time of the appointment. Session focused on continuing to process events surrounding the end of her marriage, as well as considering plans for the future. She is scheduled to be seen again in one month.     Treatment Plan Client Treatment Preferences  Crystal Waters prefers virtual appointments that take place on Wednesdays, which are a day off from work.  Client Statement of Needs  Crystal Waters is seeking cognitive behavioral therapy (CBT) to address work and health related stress, as well as a history of depression.  Treatment Level  Biweekly  Symptoms  depression: depressed mood, difficulty focusing, feelings of exhaustion (Status: maintained).  Problems Addressed  New Description, New Description  Goals 1. Crystal Waters experiences a range of health challenges that cause physical discomfort and stress Objective Crystal Waters will develop a plan for her work life and eventual retirement that she feels good about Target Date: 2021-12-21 Frequency: Monthly  Progress: 0 Modality: individual  Related Interventions Therapist will work with Crystal Waters to consider her options and develop a rewarding plan for her future  Therapist will provide referrals for additional resources as appropriate  Objective Crystal Waters will develop coping strategies to lower her stress level and improve her overall mood  Target Date: 2021-12-21 Frequency: Monthly  Progress: 0 Modality: individual  Related Interventions Crystal Waters will be provided opportunities to process her experiences in session Therapist will help Crystal Waters to identify and disengage from maladaptive behaviors using CBT based stragies Therapist will work with Crystal Waters to incorporate positive coping strategies into her day-to-day routine Therapist will incorporate behavior  activation, including incorporation of structure, mastery events, and pleasant events into [name]'s routine 2. Crystal Waters has a history of recurrent depression. 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

## 2021-11-18 ENCOUNTER — Other Ambulatory Visit: Payer: Self-pay | Admitting: Family Medicine

## 2021-11-21 ENCOUNTER — Telehealth: Payer: Self-pay

## 2021-11-21 NOTE — Patient Outreach (Signed)
  Care Coordination   11/21/2021 Name: Crystal Waters MRN: [id number] DOB: [birth year]   Care Coordination Outreach Attempts:  An unsuccessful telephone outreach was attempted today to offer the patient information about available care coordination services as a benefit of their health plan.   Follow Up Plan:  Additional outreach attempts will be made to offer the patient care coordination information and services.   Encounter Outcome:  No Answer  Care Coordination Interventions Activated:  No   Care Coordination Interventions:  No, not indicated    [facility] [phone]

## 2021-11-22 ENCOUNTER — Ambulatory Visit: Payer: Medicare Other | Admitting: Clinical

## 2021-12-08 ENCOUNTER — Telehealth: Payer: Self-pay

## 2021-12-08 NOTE — Patient Outreach (Signed)
  Care Coordination   12/08/2021 Name: Crystal Waters MRN: [id number] DOB: 07/18/1955   Care Coordination Outreach Attempts:  A second unsuccessful outreach was attempted today to offer the patient with information about available care coordination services as a benefit of their health plan.     Follow Up Plan:  Additional outreach attempts will be made to offer the patient care coordination information and services.   Encounter Outcome:  No Answer  Care Coordination Interventions Activated:  No   Care Coordination Interventions:  No, not indicated    [facility] [phone]

## 2021-12-13 ENCOUNTER — Ambulatory Visit: Payer: Medicare Other | Admitting: Clinical

## 2021-12-13 DIAGNOSIS — K5909 Other constipation: Secondary | ICD-10-CM | POA: Diagnosis not present

## 2021-12-13 DIAGNOSIS — R7303 Prediabetes: Secondary | ICD-10-CM | POA: Diagnosis not present

## 2021-12-13 DIAGNOSIS — C73 Malignant neoplasm of thyroid gland: Secondary | ICD-10-CM | POA: Diagnosis not present

## 2021-12-13 DIAGNOSIS — Z7901 Long term (current) use of anticoagulants: Secondary | ICD-10-CM | POA: Diagnosis not present

## 2021-12-13 DIAGNOSIS — G629 Polyneuropathy, unspecified: Secondary | ICD-10-CM | POA: Diagnosis not present

## 2021-12-13 DIAGNOSIS — G4730 Sleep apnea, unspecified: Secondary | ICD-10-CM | POA: Diagnosis not present

## 2021-12-20 ENCOUNTER — Ambulatory Visit: Payer: Medicare Other | Admitting: Clinical

## 2021-12-21 ENCOUNTER — Telehealth: Payer: Self-pay

## 2021-12-21 NOTE — Patient Outreach (Signed)
  Care Coordination   12/21/2021 Name: Crystal Waters MRN: [id number] DOB: [birth year]   Care Coordination Outreach Attempts:  A third unsuccessful outreach was attempted today to offer the patient with information about available care coordination services as a benefit of their health plan.   Follow Up Plan:  No further outreach attempts will be made at this time. We have been unable to contact the patient to offer or enroll patient in care coordination services  Encounter Outcome:  No Answer  Care Coordination Interventions Activated:  No   Care Coordination Interventions:  No, not indicated    [facility] [phone]

## 2022-02-02 DIAGNOSIS — K900 Celiac disease: Secondary | ICD-10-CM | POA: Diagnosis not present

## 2022-02-02 DIAGNOSIS — L130 Dermatitis herpetiformis: Secondary | ICD-10-CM | POA: Diagnosis not present

## 2022-02-02 DIAGNOSIS — Z85850 Personal history of malignant neoplasm of thyroid: Secondary | ICD-10-CM | POA: Diagnosis not present

## 2022-02-02 DIAGNOSIS — E890 Postprocedural hypothyroidism: Secondary | ICD-10-CM | POA: Diagnosis not present

## 2022-03-02 ENCOUNTER — Other Ambulatory Visit: Payer: Self-pay | Admitting: Family Medicine

## 2022-03-02 DIAGNOSIS — Z1231 Encounter for screening mammogram for malignant neoplasm of breast: Secondary | ICD-10-CM

## 2022-04-13 DIAGNOSIS — K5909 Other constipation: Secondary | ICD-10-CM | POA: Diagnosis not present

## 2022-04-13 DIAGNOSIS — G629 Polyneuropathy, unspecified: Secondary | ICD-10-CM | POA: Diagnosis not present

## 2022-04-13 DIAGNOSIS — G4730 Sleep apnea, unspecified: Secondary | ICD-10-CM | POA: Diagnosis not present

## 2022-04-27 ENCOUNTER — Ambulatory Visit
Admission: RE | Admit: 2022-04-27 | Discharge: 2022-04-27 | Disposition: A | Discharge status: Home / Self Care | Payer: Medicare Other | Source: Ambulatory Visit | Attending: Family Medicine | Admitting: Family Medicine

## 2022-04-27 DIAGNOSIS — Z1231 Encounter for screening mammogram for malignant neoplasm of breast: Secondary | ICD-10-CM | POA: Diagnosis not present

## 2022-07-10 DIAGNOSIS — H8309 Labyrinthitis, unspecified ear: Secondary | ICD-10-CM | POA: Diagnosis not present

## 2022-08-03 DIAGNOSIS — C73 Malignant neoplasm of thyroid gland: Secondary | ICD-10-CM | POA: Diagnosis not present

## 2022-08-03 DIAGNOSIS — R7303 Prediabetes: Secondary | ICD-10-CM | POA: Diagnosis not present

## 2022-08-03 DIAGNOSIS — G629 Polyneuropathy, unspecified: Secondary | ICD-10-CM | POA: Diagnosis not present

## 2022-08-03 DIAGNOSIS — E785 Hyperlipidemia, unspecified: Secondary | ICD-10-CM | POA: Diagnosis not present

## 2022-08-03 DIAGNOSIS — R413 Other amnesia: Secondary | ICD-10-CM | POA: Diagnosis not present

## 2022-08-17 DIAGNOSIS — C73 Malignant neoplasm of thyroid gland: Secondary | ICD-10-CM | POA: Diagnosis not present

## 2022-08-17 DIAGNOSIS — G629 Polyneuropathy, unspecified: Secondary | ICD-10-CM | POA: Diagnosis not present

## 2022-09-25 DIAGNOSIS — I10 Essential (primary) hypertension: Secondary | ICD-10-CM | POA: Diagnosis not present

## 2022-09-25 DIAGNOSIS — M799 Soft tissue disorder, unspecified: Secondary | ICD-10-CM | POA: Diagnosis not present

## 2022-09-25 DIAGNOSIS — G629 Polyneuropathy, unspecified: Secondary | ICD-10-CM | POA: Diagnosis not present

## 2022-09-25 DIAGNOSIS — R7303 Prediabetes: Secondary | ICD-10-CM | POA: Diagnosis not present

## 2022-09-28 ENCOUNTER — Other Ambulatory Visit: Payer: Self-pay | Admitting: Family Medicine

## 2022-09-28 ENCOUNTER — Ambulatory Visit
Admission: RE | Admit: 2022-09-28 | Discharge: 2022-09-28 | Disposition: A | Discharge status: Home / Self Care | Payer: Medicare Other | Source: Ambulatory Visit | Attending: Family Medicine | Admitting: Family Medicine

## 2022-09-28 ENCOUNTER — Encounter: Payer: Self-pay | Admitting: Family Medicine

## 2022-09-28 DIAGNOSIS — D1721 Benign lipomatous neoplasm of skin and subcutaneous tissue of right arm: Secondary | ICD-10-CM | POA: Diagnosis not present

## 2022-09-28 DIAGNOSIS — G629 Polyneuropathy, unspecified: Secondary | ICD-10-CM | POA: Diagnosis not present

## 2022-09-28 DIAGNOSIS — M130 Polyarthritis, unspecified: Secondary | ICD-10-CM

## 2022-09-28 DIAGNOSIS — C73 Malignant neoplasm of thyroid gland: Secondary | ICD-10-CM | POA: Diagnosis not present

## 2022-09-28 DIAGNOSIS — M4312 Spondylolisthesis, cervical region: Secondary | ICD-10-CM | POA: Diagnosis not present

## 2022-09-28 DIAGNOSIS — M25512 Pain in left shoulder: Secondary | ICD-10-CM | POA: Diagnosis not present

## 2022-09-28 DIAGNOSIS — M47814 Spondylosis without myelopathy or radiculopathy, thoracic region: Secondary | ICD-10-CM | POA: Diagnosis not present

## 2022-09-28 DIAGNOSIS — M25511 Pain in right shoulder: Secondary | ICD-10-CM | POA: Diagnosis not present

## 2022-09-28 DIAGNOSIS — M47812 Spondylosis without myelopathy or radiculopathy, cervical region: Secondary | ICD-10-CM | POA: Diagnosis not present

## 2022-10-19 DIAGNOSIS — Z0001 Encounter for general adult medical examination with abnormal findings: Secondary | ICD-10-CM | POA: Diagnosis not present

## 2022-10-19 DIAGNOSIS — G629 Polyneuropathy, unspecified: Secondary | ICD-10-CM | POA: Diagnosis not present

## 2022-11-09 ENCOUNTER — Encounter: Payer: Self-pay | Admitting: Physical Therapy

## 2022-11-09 ENCOUNTER — Ambulatory Visit: Payer: Medicare Other | Admitting: Physical Therapy

## 2022-11-09 DIAGNOSIS — M25612 Stiffness of left shoulder, not elsewhere classified: Secondary | ICD-10-CM

## 2022-11-09 DIAGNOSIS — M25611 Stiffness of right shoulder, not elsewhere classified: Secondary | ICD-10-CM

## 2022-11-09 NOTE — Therapy (Signed)
OUTPATIENT PHYSICAL THERAPY NEURO EVALUATION - ARRIVED NO CHARGE   Patient Name: Crystal Waters MRN: [id number] DOB:1955/12/31, 67 y.o., female Today's Date: 11/09/2022   PCP: Renaye Rakers, MD  REFERRING PROVIDER: Renaye Rakers, MD   END OF SESSION:  PT End of Session - 11/09/22 1001     Visit Number 1   arrived no charge   PT Start Time 1000    PT Stop Time 1015    PT Time Calculation (min) 15 min             Past Medical History:  Diagnosis Date   Allergy    gluten, seasonal   Anxiety    Arthritis    Asthma    seasonal    Cancer (HCC)    Thyroid   Complication of anesthesia    Constipation    Depression    GERD (gastroesophageal reflux disease)    Gestational diabetes mellitus    with 1 child.     H/O nose injury    accidently hit in the nose, has a stuffy nose, can't lie flat, wears nasal strips at night   History of kidney stones      x 2 passed   Hyperlipidemia    Hypertension    Hypothyroidism    Kidney stones    PONV (postoperative nausea and vomiting)    PTSD (post-traumatic stress disorder)    Sleep apnea    Thyroid cancer (HCC)    Thyroid disease    Past Surgical History:  Procedure Laterality Date   CHOLECYSTECTOMY N/A 04/11/2016   Procedure: LAPAROSCOPIC CHOLECYSTECTOMY WITH INTRAOPERATIVE CHOLANGIOGRAM;  Surgeon: Violeta Gelinas, MD;  Location: [facility] OR;  Service: General;  Laterality: N/A;   COLONOSCOPY     KNEE SURGERY Right 2009   LAPAROTOMY N/A 04/17/2016   Procedure: LAPAROTOMY REPAIR OF INCARCERATED INCISIONAL HERNIA WITH VAC PLACEMENT;  Surgeon: Emelia Loron, MD;  Location: [facility] OR;  Service: General;  Laterality: N/A;   REPLACEMENT TOTAL KNEE Bilateral 2021   THYROID SURGERY     2001   TUBAL LIGATION     Patient Active Problem List   Diagnosis Date Noted   Controlled substance agreement signed 08/25/2020   Mixed hyperlipidemia 05/11/2020   Stress at work 11/11/2018   Candidiasis of skin 09/12/2018   ADD (attention deficit  disorder) 05/06/2018   Postsurgical hypothyroidism 09/18/2017   History of thyroid cancer 09/18/2017   GERD (gastroesophageal reflux disease) 12/24/2016   Incarcerated incisional hernia 04/18/2016   Left shoulder pain 10/03/2015   Prediabetes 02/07/2015   Atypical nevi 10/29/2013   OA (osteoarthritis) of knee 01/15/2013   Class 2 obesity 01/15/2013   Essential hypertension, benign 01/15/2013   Chronic insomnia 01/15/2013   Major depressive disorder 01/15/2013   GAD (generalized anxiety disorder) 01/15/2013    ONSET DATE: 10/22/2022   REFERRING DIAG: G62.9 (ICD-10-CM) - Polyneuropathy   THERAPY DIAG:  Stiffness of left shoulder, not elsewhere classified  Stiffness of right shoulder, not elsewhere classified  Rationale for Evaluation and Treatment: Rehabilitation  SUBJECTIVE:  SUBJECTIVE STATEMENT: Gets aches and pains. Reports her PCP thinks its muscular. Reports she has a very sedentary job. Sometimes has trouble lifting her arms and reports having trouble with her neck, but that has gotten better. That hasn't been bothersome in a least a month. Sometimes will raise her arms and has trouble getting a shirt on, does not have trouble with that today. Sometimes L wrist and thumb will get painful. Will wrap it with an ace bandage and that will help.  Pt accompanied by: self  PERTINENT HISTORY: PMH: Anxiety, Depression, HLD, HTN, PTSD, thyroid cancer, ADD   Pt unsure why she was referred for PT. Pt no longer having any neck pain. Pt reports just issues with bilateral shoulder and L wrist/thumb (pt reporting potential carpal tunnel?). Pt has an OT referral and eval scheduled and discussed she would be better served with OT at this clinic. Able to move pt's OT eval up a couple weeks. Also discussed  walking more for exercise and taking movement breaks as needed during the work day as pt has a sedentary job. Pt in agreement with plan.    Drake Leach, PT, DPT 11/09/2022, 10:28 AM

## 2022-12-03 ENCOUNTER — Ambulatory Visit: Payer: Medicare Other | Attending: Family Medicine | Admitting: Occupational Therapy

## 2022-12-03 DIAGNOSIS — R29818 Other symptoms and signs involving the nervous system: Secondary | ICD-10-CM | POA: Insufficient documentation

## 2022-12-03 DIAGNOSIS — R208 Other disturbances of skin sensation: Secondary | ICD-10-CM | POA: Insufficient documentation

## 2022-12-03 DIAGNOSIS — M79602 Pain in left arm: Secondary | ICD-10-CM | POA: Insufficient documentation

## 2022-12-03 DIAGNOSIS — M79601 Pain in right arm: Secondary | ICD-10-CM | POA: Insufficient documentation

## 2022-12-03 DIAGNOSIS — M6281 Muscle weakness (generalized): Secondary | ICD-10-CM | POA: Insufficient documentation

## 2022-12-03 NOTE — Therapy (Signed)
OUTPATIENT OCCUPATIONAL THERAPY ORTHO EVALUATION  Patient Name: Crystal Waters MRN: [id number] DOB:06/25/1955, 67 y.o., female Today's Date: 12/03/2022  PCP: Renaye Rakers, MD  REFERRING PROVIDER: Renaye Rakers, MD  END OF SESSION:  OT End of Session - 12/03/22 1536     Visit Number 1    Number of Visits 9    Date for OT Re-Evaluation 02/15/23    Authorization Type [employer]    Progress Note Due on Visit 9    OT Start Time 1536    OT Stop Time 1620    OT Time Calculation (min) 44 min    Activity Tolerance Patient tolerated treatment well    Behavior During Therapy WFL for tasks assessed/performed             Past Medical History:  Diagnosis Date   Allergy    gluten, seasonal   Anxiety    Arthritis    Asthma    seasonal    Cancer (HCC)    Thyroid   Complication of anesthesia    Constipation    Depression    GERD (gastroesophageal reflux disease)    Gestational diabetes mellitus    with 1 child.     H/O nose injury    accidently hit in the nose, has a stuffy nose, can't lie flat, wears nasal strips at night   History of kidney stones      x 2 passed   Hyperlipidemia    Hypertension    Hypothyroidism    Kidney stones    PONV (postoperative nausea and vomiting)    PTSD (post-traumatic stress disorder)    Sleep apnea    Thyroid cancer (HCC)    Thyroid disease    Past Surgical History:  Procedure Laterality Date   CHOLECYSTECTOMY N/A 04/11/2016   Procedure: LAPAROSCOPIC CHOLECYSTECTOMY WITH INTRAOPERATIVE CHOLANGIOGRAM;  Surgeon: Violeta Gelinas, MD;  Location: [facility];  Service: General;  Laterality: N/A;   COLONOSCOPY     KNEE SURGERY Right 2009   LAPAROTOMY N/A 04/17/2016   Procedure: LAPAROTOMY REPAIR OF INCARCERATED INCISIONAL HERNIA WITH VAC PLACEMENT;  Surgeon: Emelia Loron, MD;  Location: [facility];  Service: General;  Laterality: N/A;   REPLACEMENT TOTAL KNEE Bilateral 2021   THYROID SURGERY     2001   TUBAL LIGATION     Patient Active  Problem List   Diagnosis Date Noted   Controlled substance agreement signed 08/25/2020   Mixed hyperlipidemia 05/11/2020   Stress at work 11/11/2018   Candidiasis of skin 09/12/2018   ADD (attention deficit disorder) 05/06/2018   Postsurgical hypothyroidism 09/18/2017   History of thyroid cancer 09/18/2017   GERD (gastroesophageal reflux disease) 12/24/2016   Incarcerated incisional hernia 04/18/2016   Left shoulder pain 10/03/2015   Prediabetes 02/07/2015   Atypical nevi 10/29/2013   OA (osteoarthritis) of knee 01/15/2013   Class 2 obesity 01/15/2013   Essential hypertension, benign 01/15/2013   Chronic insomnia 01/15/2013   Major depressive disorder 01/15/2013   GAD (generalized anxiety disorder) 01/15/2013    ONSET DATE: 10/23/2022 (date of referral)  REFERRING DIAG: G62.9 (ICD-10-CM) - Polyneuropathy, unspecified  THERAPY DIAG:  No diagnosis found.  Rationale for Evaluation and Treatment: Rehabilitation  SUBJECTIVE:   SUBJECTIVE STATEMENT: She feels like her L hand freezes up a lot at work and has shooting pain at times. She thinks she is sleeping on her hands. Intermittent R shoulder pain. Uses tub and sink to help get on and off commode. Pain and sleeping in shoulders and  hips.  Pt accompanied by: self  PERTINENT HISTORY: PMH: Anxiety, Depression, HLD, HTN, PTSD, thyroid cancer, ADD   PRECAUTIONS: None  WEIGHT BEARING RESTRICTIONS: No  PAIN:  Are you having pain? No  FALLS: Has patient fallen in last 6 months? No  LIVING ENVIRONMENT: Lives with: lives with their son Lives in: House/apartment Stairs: Yes: External: 1 steps; none Has following equipment at home: Single point cane and Walker - 2 wheeled  PLOF: Independent; driving; working part-time in customer care  PATIENT GOALS: Improve pain and functional use of BUE  OBJECTIVE:   HAND DOMINANCE: Right  ADLs: Overall ADLs: mod I  FUNCTIONAL OUTCOME MEASURES: Quick Dash: 25.0 %   UPPER  EXTREMITY ROM:     Generally WFL though with pain/tightness in R shoulder  UPPER EXTREMITY MMT:     BUE WFL though reports R shoulder pain  HAND FUNCTION: Grip strength: Right: 58.2 lbs; Left: 50.4 lbs  COORDINATION: 9 Hole Peg test: Right: 22 sec; Left: 23 sec  SENSATION: Worse in L hand , reports numbness, but no tingling or pins and needles  EDEMA: none reported or observed  COGNITION: Overall cognitive status: Within functional limits for tasks assessed  OBSERVATIONS:  Fatty deposit R lateral forearm. Pt appears well-kept. Ambulates without AD or LOB.   TODAY'S TREATMENT:                                                                                                                               N/A this date  PATIENT EDUCATION: Education details: OT Role and POC Person educated: Patient Education method: Explanation Education comprehension: verbalized understanding  HOME EXERCISE PROGRAM: N/A this date  GOALS:  SHORT TERM GOALS: Target date: 01/04/2023    Patient will demonstrate independence with initial BUE HEP. Baseline: Goal status: INITIAL  2.  Pt will independently recall at least 2 pain reduction strategies for management of BUE pain.  Baseline: moderate BUE pain Goal status: INITIAL  3.  Pt will independently recall at least 3 joint protection, ergonomics, and body mechanic principles as noted in pt instructions.   Baseline:  Goal status: INITIAL  LONG TERM GOALS: Target date: 02/15/2023  Patient will demonstrate updated RUE and LUE HEP with 25% verbal cues or less for proper execution. Baseline:  Goal status: INITIAL  2.  Pt will report no more than moderate difficulty opening and tight or new jar using AD as needed.  Baseline: unable Goal status: INITIAL  3.  Pt will complete BUE shoulder ROM testing reporting improvement to pain and tightness.  Baseline: noted pain and tightness B with testing at eval Goal status:  INITIAL   ASSESSMENT:  CLINICAL IMPRESSION: Patient is a 67 y.o. female who was seen today for occupational therapy evaluation for polyneuropathy. Hx includes Anxiety, Depression, HLD, HTN, PTSD, thyroid cancer, ADD. Patient currently presents below baseline level of functioning demonstrating functional deficits and impairments as noted below. Pt would benefit from skilled  OT services in the outpatient setting to work on impairments as noted below to help pt return to PLOF as able.    PERFORMANCE DEFICITS: in functional skills including ADLs, IADLs, coordination, sensation, strength, pain, Fine motor control, endurance, decreased knowledge of use of DME, and UE functional use.   IMPAIRMENTS: are limiting patient from ADLs, IADLs, rest and sleep, work, and leisure.   COMORBIDITIES: may have co-morbidities  that affects occupational performance. Patient will benefit from skilled OT to address above impairments and improve overall function.  MODIFICATION OR ASSISTANCE TO COMPLETE EVALUATION: No modification of tasks or assist necessary to complete an evaluation.  OT OCCUPATIONAL PROFILE AND HISTORY: Problem focused assessment: Including review of records relating to presenting problem.  CLINICAL DECISION MAKING: LOW - limited treatment options, no task modification necessary  REHAB POTENTIAL: Fair given chronicity of symptoms  EVALUATION COMPLEXITY: Low     PLAN:  OT FREQUENCY: 1x/week  OT DURATION: 8 weeks  PLANNED INTERVENTIONS: self care/ADL training, therapeutic exercise, therapeutic activity, neuromuscular re-education, manual therapy, passive range of motion, splinting, electrical stimulation, ultrasound, paraffin, fluidotherapy, moist heat, contrast bath, patient/family education, DME and/or AE instructions, and Re-evaluation  RECOMMENDED OTHER SERVICES: none at this time  CONSULTED AND AGREED WITH PLAN OF CARE: Patient  PLAN FOR NEXT SESSION: Sleep positioning and ROM HEP;  modality use  Delana Meyer, OT 12/03/2022, 4:26 PM

## 2022-12-07 ENCOUNTER — Encounter: Payer: Medicare Other | Admitting: Occupational Therapy

## 2022-12-07 DIAGNOSIS — E88818 Other insulin resistance: Secondary | ICD-10-CM | POA: Diagnosis not present

## 2022-12-07 DIAGNOSIS — M130 Polyarthritis, unspecified: Secondary | ICD-10-CM | POA: Diagnosis not present

## 2022-12-07 DIAGNOSIS — G4730 Sleep apnea, unspecified: Secondary | ICD-10-CM | POA: Diagnosis not present

## 2022-12-14 ENCOUNTER — Encounter: Payer: Medicare Other | Admitting: Occupational Therapy

## 2022-12-18 ENCOUNTER — Ambulatory Visit: Payer: Medicare Other | Attending: Family Medicine | Admitting: Occupational Therapy

## 2022-12-18 DIAGNOSIS — M25611 Stiffness of right shoulder, not elsewhere classified: Secondary | ICD-10-CM | POA: Diagnosis not present

## 2022-12-18 DIAGNOSIS — M6281 Muscle weakness (generalized): Secondary | ICD-10-CM | POA: Diagnosis not present

## 2022-12-18 DIAGNOSIS — M25612 Stiffness of left shoulder, not elsewhere classified: Secondary | ICD-10-CM

## 2022-12-18 DIAGNOSIS — M79602 Pain in left arm: Secondary | ICD-10-CM | POA: Diagnosis not present

## 2022-12-18 DIAGNOSIS — R29818 Other symptoms and signs involving the nervous system: Secondary | ICD-10-CM | POA: Diagnosis not present

## 2022-12-18 DIAGNOSIS — R208 Other disturbances of skin sensation: Secondary | ICD-10-CM

## 2022-12-18 DIAGNOSIS — M79601 Pain in right arm: Secondary | ICD-10-CM

## 2022-12-18 NOTE — Therapy (Signed)
OUTPATIENT OCCUPATIONAL THERAPY ORTHO TREATMENT  Patient Name: Crystal Waters MRN: [id number] DOB:[birth year], 67 y.o., female Today's Date: 12/18/2022  PCP: Renaye Rakers, MD  REFERRING PROVIDER: Renaye Rakers, MD  END OF SESSION:  OT End of Session - 12/18/22 0935     Visit Number 2    Number of Visits 9    Date for OT Re-Evaluation 02/15/23    Authorization Type [employer]    Progress Note Due on Visit 9    OT Start Time [phone]    OT Stop Time 1014    OT Time Calculation (min) 40 min    Activity Tolerance Patient tolerated treatment well    Behavior During Therapy WFL for tasks assessed/performed             Past Medical History:  Diagnosis Date   Allergy    gluten, seasonal   Anxiety    Arthritis    Asthma    seasonal    Cancer (HCC)    Thyroid   Complication of anesthesia    Constipation    Depression    GERD (gastroesophageal reflux disease)    Gestational diabetes mellitus    with 1 child.     H/O nose injury    accidently hit in the nose, has a stuffy nose, can't lie flat, wears nasal strips at night   History of kidney stones      x 2 passed   Hyperlipidemia    Hypertension    Hypothyroidism    Kidney stones    PONV (postoperative nausea and vomiting)    PTSD (post-traumatic stress disorder)    Sleep apnea    Thyroid cancer (HCC)    Thyroid disease    Past Surgical History:  Procedure Laterality Date   CHOLECYSTECTOMY N/A 04/11/2016   Procedure: LAPAROSCOPIC CHOLECYSTECTOMY WITH INTRAOPERATIVE CHOLANGIOGRAM;  Surgeon: Violeta Gelinas, MD;  Location: [facility];  Service: General;  Laterality: N/A;   COLONOSCOPY     KNEE SURGERY Right 2009   LAPAROTOMY N/A 04/17/2016   Procedure: LAPAROTOMY REPAIR OF INCARCERATED INCISIONAL HERNIA WITH VAC PLACEMENT;  Surgeon: Emelia Loron, MD;  Location: [facility];  Service: General;  Laterality: N/A;   REPLACEMENT TOTAL KNEE Bilateral 2021   THYROID SURGERY     2001   TUBAL LIGATION     Patient Active  Problem List   Diagnosis Date Noted   Controlled substance agreement signed 08/25/2020   Mixed hyperlipidemia 05/11/2020   Stress at work 11/11/2018   Candidiasis of skin 09/12/2018   ADD (attention deficit disorder) 05/06/2018   Postsurgical hypothyroidism 09/18/2017   History of thyroid cancer 09/18/2017   GERD (gastroesophageal reflux disease) 12/24/2016   Incarcerated incisional hernia 04/18/2016   Left shoulder pain 10/03/2015   Prediabetes 02/07/2015   Atypical nevi 10/29/2013   OA (osteoarthritis) of knee 01/15/2013   Class 2 obesity 01/15/2013   Essential hypertension, benign 01/15/2013   Chronic insomnia 01/15/2013   Major depressive disorder 01/15/2013   GAD (generalized anxiety disorder) 01/15/2013    ONSET DATE: 10/23/2022 (date of referral)  REFERRING DIAG: G62.9 (ICD-10-CM) - Polyneuropathy, unspecified  THERAPY DIAG:  Other symptoms and signs involving the nervous system  Other disturbances of skin sensation  Muscle weakness (generalized)  Pain in right arm  Pain in left arm  Stiffness of left shoulder, not elsewhere classified  Stiffness of right shoulder, not elsewhere classified  Rationale for Evaluation and Treatment: Rehabilitation  SUBJECTIVE:   SUBJECTIVE STATEMENT: She has had trouble reaching up  to items.   Pt accompanied by: self  PERTINENT HISTORY: PMH: Anxiety, Depression, HLD, HTN, PTSD, thyroid cancer, ADD   PRECAUTIONS: None  WEIGHT BEARING RESTRICTIONS: No  PAIN:  Are you having pain? Yes: NPRS scale: 2/10 Pain location: R shoulder Pain description: sore Aggravating factors: Reaching overhead Relieving factors: rest  FALLS: Has patient fallen in last 6 months? No  LIVING ENVIRONMENT: Lives with: lives with their son Lives in: House/apartment Stairs: Yes: External: 1 steps; none Has following equipment at home: Single point cane and Walker - 2 wheeled  PLOF: Independent; driving; working part-time in customer  care  PATIENT GOALS: Improve pain and functional use of BUE  OBJECTIVE:   HAND DOMINANCE: Right  ADLs: Overall ADLs: mod I  FUNCTIONAL OUTCOME MEASURES: Quick Dash: 25.0 %   UPPER EXTREMITY ROM:     Generally WFL though with pain/tightness in R shoulder  UPPER EXTREMITY MMT:     BUE WFL though reports R shoulder pain  HAND FUNCTION: Grip strength: Right: 58.2 lbs; Left: 50.4 lbs  COORDINATION: 9 Hole Peg test: Right: 22 sec; Left: 23 sec  SENSATION: Worse in L hand , reports numbness, but no tingling or pins and needles  EDEMA: none reported or observed  COGNITION: Overall cognitive status: Within functional limits for tasks assessed  OBSERVATIONS:  Fatty deposit R lateral forearm. Pt appears well-kept. Ambulates without AD or LOB.   TODAY'S TREATMENT:                                                                                                                              - Therapeutic exercises completed for duration as noted below including:  OT initiated R shoulder flexion, abduction, and ER stretches as noted in pt instructions for improved ROM. Pt demonstrating at table and wall.   OT initiated L tendon glides as noted in pt instructions for improved ROM and pain management.   - Self-care/home management completed for duration as noted below including: OT educated patient on sleep positioning as noted in patient instructions to reduce stress to upper extremity nerves, which could be attributing to reported paresthesias and pain in affected extremity. Patient verbalized understanding. Handout provided.  PATIENT EDUCATION: Education details: L Tendon glides, R shoulder ROM; sleep positioning Person educated: Patient Education method: Explanation, Demonstration, and Handouts Education comprehension: verbalized understanding, returned demonstration, and needs further education  HOME EXERCISE PROGRAM: 12/18/2022: L Tendon glides, R shoulder ROM; sleep  positioning  GOALS:  SHORT TERM GOALS: Target date: 01/04/2023    Patient will demonstrate independence with initial BUE HEP. Baseline: Goal status: INITIAL  2.  Pt will independently recall at least 2 pain reduction strategies for management of BUE pain.  Baseline: moderate BUE pain Goal status: INITIAL  3.  Pt will independently recall at least 3 joint protection, ergonomics, and body mechanic principles as noted in pt instructions.   Baseline:  Goal status: INITIAL  LONG TERM GOALS: Target date: 02/15/2023  Patient will demonstrate updated RUE and LUE HEP with 25% verbal cues or less for proper execution. Baseline:  Goal status: INITIAL  2.  Pt will report no more than moderate difficulty opening and tight or new jar using AD as needed.  Baseline: unable Goal status: INITIAL  3.  Pt will complete BUE shoulder ROM testing reporting improvement to pain and tightness.  Baseline: noted pain and tightness B with testing at eval Goal status: INITIAL   ASSESSMENT:  CLINICAL IMPRESSION: Pt demonstrates good understanding of HEP as needed to progress towards goals. Will monitor R shoulder pain and use of L thumb brace and modify recommendations as needed.  PERFORMANCE DEFICITS: in functional skills including ADLs, IADLs, coordination, sensation, strength, pain, Fine motor control, endurance, decreased knowledge of use of DME, and UE functional use.   IMPAIRMENTS: are limiting patient from ADLs, IADLs, rest and sleep, work, and leisure.   COMORBIDITIES: may have co-morbidities  that affects occupational performance. Patient will benefit from skilled OT to address above impairments and improve overall function.  REHAB POTENTIAL: Fair given chronicity of symptoms  PLAN:  OT FREQUENCY: 1x/week  OT DURATION: 8 weeks  PLANNED INTERVENTIONS: self care/ADL training, therapeutic exercise, therapeutic activity, neuromuscular re-education, manual therapy, passive range of motion,  splinting, electrical stimulation, ultrasound, paraffin, fluidotherapy, moist heat, contrast bath, patient/family education, DME and/or AE instructions, and Re-evaluation  RECOMMENDED OTHER SERVICES: none at this time  CONSULTED AND AGREED WITH PLAN OF CARE: Patient  PLAN FOR NEXT SESSION: ergonomics, joint protection; review tendon glides and sleep positioning (how is L thumb brace doing?)  Delana Meyer, OT 12/18/2022, 10:54 AM

## 2022-12-28 ENCOUNTER — Ambulatory Visit: Payer: Medicare Other | Admitting: Occupational Therapy

## 2022-12-28 DIAGNOSIS — M79602 Pain in left arm: Secondary | ICD-10-CM

## 2022-12-28 DIAGNOSIS — M25612 Stiffness of left shoulder, not elsewhere classified: Secondary | ICD-10-CM | POA: Diagnosis not present

## 2022-12-28 DIAGNOSIS — R29818 Other symptoms and signs involving the nervous system: Secondary | ICD-10-CM | POA: Diagnosis not present

## 2022-12-28 DIAGNOSIS — M25611 Stiffness of right shoulder, not elsewhere classified: Secondary | ICD-10-CM | POA: Diagnosis not present

## 2022-12-28 DIAGNOSIS — R208 Other disturbances of skin sensation: Secondary | ICD-10-CM | POA: Diagnosis not present

## 2022-12-28 DIAGNOSIS — M6281 Muscle weakness (generalized): Secondary | ICD-10-CM | POA: Diagnosis not present

## 2022-12-28 DIAGNOSIS — M79601 Pain in right arm: Secondary | ICD-10-CM | POA: Diagnosis not present

## 2022-12-28 NOTE — Therapy (Addendum)
OUTPATIENT OCCUPATIONAL THERAPY ORTHO TREATMENT  Patient Name: Crystal Waters MRN: [id number] DOB:03/04/1956, 67 y.o., female Today's Date: 12/28/2022  PCP: Renaye Rakers, MD  REFERRING PROVIDER: Renaye Rakers, MD  END OF SESSION:  OT End of Session - 12/28/22 1011     Visit Number 3    Number of Visits 9    Date for OT Re-Evaluation 02/15/23    Authorization Type [employer]    Progress Note Due on Visit 9    OT Start Time 1017    OT Stop Time 1057    OT Time Calculation (min) 40 min    Activity Tolerance Patient tolerated treatment well    Behavior During Therapy WFL for tasks assessed/performed              Past Medical History:  Diagnosis Date   Allergy    gluten, seasonal   Anxiety    Arthritis    Asthma    seasonal    Cancer (HCC)    Thyroid   Complication of anesthesia    Constipation    Depression    GERD (gastroesophageal reflux disease)    Gestational diabetes mellitus    with 1 child.     H/O nose injury    accidently hit in the nose, has a stuffy nose, can't lie flat, wears nasal strips at night   History of kidney stones      x 2 passed   Hyperlipidemia    Hypertension    Hypothyroidism    Kidney stones    PONV (postoperative nausea and vomiting)    PTSD (post-traumatic stress disorder)    Sleep apnea    Thyroid cancer (HCC)    Thyroid disease    Past Surgical History:  Procedure Laterality Date   CHOLECYSTECTOMY N/A 04/11/2016   Procedure: LAPAROSCOPIC CHOLECYSTECTOMY WITH INTRAOPERATIVE CHOLANGIOGRAM;  Surgeon: Violeta Gelinas, MD;  Location: [facility] OR;  Service: General;  Laterality: N/A;   COLONOSCOPY     KNEE SURGERY Right 2009   LAPAROTOMY N/A 04/17/2016   Procedure: LAPAROTOMY REPAIR OF INCARCERATED INCISIONAL HERNIA WITH VAC PLACEMENT;  Surgeon: Emelia Loron, MD;  Location: [facility] OR;  Service: General;  Laterality: N/A;   REPLACEMENT TOTAL KNEE Bilateral 2021   THYROID SURGERY     2001   TUBAL LIGATION     Patient Active  Problem List   Diagnosis Date Noted   Controlled substance agreement signed 08/25/2020   Mixed hyperlipidemia 05/11/2020   Stress at work 11/11/2018   Candidiasis of skin 09/12/2018   ADD (attention deficit disorder) 05/06/2018   Postsurgical hypothyroidism 09/18/2017   History of thyroid cancer 09/18/2017   GERD (gastroesophageal reflux disease) 12/24/2016   Incarcerated incisional hernia 04/18/2016   Left shoulder pain 10/03/2015   Prediabetes 02/07/2015   Atypical nevi 10/29/2013   OA (osteoarthritis) of knee 01/15/2013   Class 2 obesity 01/15/2013   Essential hypertension, benign 01/15/2013   Chronic insomnia 01/15/2013   Major depressive disorder 01/15/2013   GAD (generalized anxiety disorder) 01/15/2013    ONSET DATE: 10/23/2022 (date of referral)  REFERRING DIAG: G62.9 (ICD-10-CM) - Polyneuropathy, unspecified  THERAPY DIAG:  Other symptoms and signs involving the nervous system  Other disturbances of skin sensation  Pain in right arm  Pain in left arm  Stiffness of left shoulder, not elsewhere classified  Stiffness of right shoulder, not elsewhere classified  Rationale for Evaluation and Treatment: Rehabilitation  SUBJECTIVE:   SUBJECTIVE STATEMENT: Pt reports no recent falls or changes to medications.  Pt reports BUE "have not bothered me much this week." Pt reports completing HEP tendon glides. Pt reports brace is helping and wears brace while sleeping.  Pt accompanied by: self  PERTINENT HISTORY: PMH: Anxiety, Depression, HLD, HTN, PTSD, thyroid cancer, ADD   PRECAUTIONS: None  WEIGHT BEARING RESTRICTIONS: No  PAIN:  Are you having pain? Yes: NPRS scale: 2/10 Pain location: L thumb Pain description: aching Aggravating factors: yardwork (pulling weeds) Relieving factors: rest, wearing brace at night  FALLS: Has patient fallen in last 6 months? No  LIVING ENVIRONMENT: Lives with: lives with their son Lives in: House/apartment Stairs: Yes:  External: 1 steps; none Has following equipment at home: Single point cane and Walker - 2 wheeled  PLOF: Independent; driving; working part-time in customer care  PATIENT GOALS: Improve pain and functional use of BUE  OBJECTIVE:   HAND DOMINANCE: Right  ADLs: Overall ADLs: mod I  FUNCTIONAL OUTCOME MEASURES: Quick Dash: 25.0 %   UPPER EXTREMITY ROM:     Generally WFL though with pain/tightness in R shoulder  UPPER EXTREMITY MMT:     BUE WFL though reports R shoulder pain  HAND FUNCTION: Grip strength: Right: 58.2 lbs; Left: 50.4 lbs  COORDINATION: 9 Hole Peg test: Right: 22 sec; Left: 23 sec  SENSATION: Worse in L hand , reports numbness, but no tingling or pins and needles  EDEMA: none reported or observed  COGNITION: Overall cognitive status: Within functional limits for tasks assessed  OBSERVATIONS:  Fatty deposit R lateral forearm. Pt appears well-kept. Ambulates without AD or LOB.   TODAY'S TREATMENT:                                                                                                                               Self-Care:   OT educated patient on joint protection strategies for ADLs/IADLs, sleep strategies (hand in another pillow's pillowcase to prevent placing hand under head), review of tendon glides, good standing/seated posture with core stability, body mechanics principles during tasks, avoiding pain during tasks, and updated shoulder ROM HEP. Pt acknowledged understanding. Handouts provided (see pt instructions).  Pt participated in simulated tasks with focus on seated and standing posture while carrying objects with proper body mechanics and using environmental objects (e.g. countertop) to decrease load. Pt demo'd understanding of ergonomic principles though would likely benefit from continuing education.   TherEx:   Initiated updated shoulder ROM HEP to increase BUE strengthening and ROM and decrease pain - Pt demo'd understanding.  Handout provided: Access Code: [email or web address] URL: [email or web address] Date: 12/28/2022 Prepared by: Carilyn Goodpasture  Exercises  - Seated Shoulder Flexion Towel Slide at Table Top  (4-5 reps, hold 10-15 s each rep) - Seated Shoulder Abduction Towel Slide at Table Top (4-5 reps, hold 10-15 s each rep) - Seated Shoulder Scaption Slide at Table Top with Forearm in Neutral (4-5 reps, hold 10-15 s each rep) - Seated Scapular Retraction   (10 reps) - Seated Shoulder  Shrug  (10 reps) - Seated Shoulder Shrug Circles AROM Backward (10 reps)   PATIENT EDUCATION: Education details: See tx note above Person educated: Patient Education method: Explanation, Demonstration, and Handouts Education comprehension: verbalized understanding, returned demonstration, and needs further education  HOME EXERCISE PROGRAM: 12/18/2022: L Tendon glides, R shoulder ROM; sleep positioning 12/28/2022: BUE Shoulder ROM HEP. Access code: [email or web address]  GOALS:  SHORT TERM GOALS: Target date: 01/04/2023    Patient will demonstrate independence with initial BUE HEP. Baseline: Goal status: INITIAL  2.  Pt will independently recall at least 2 pain reduction strategies for management of BUE pain.  Baseline: moderate BUE pain Goal status: INITIAL  3.  Pt will independently recall at least 3 joint protection, ergonomics, and body mechanic principles as noted in pt instructions.   Baseline:  Goal status: INITIAL  LONG TERM GOALS: Target date: 02/15/2023  Patient will demonstrate updated RUE and LUE HEP with 25% verbal cues or less for proper execution. Baseline:  Goal status: INITIAL  2.  Pt will report no more than moderate difficulty opening and tight or new jar using AD as needed.  Baseline: unable Goal status: INITIAL  3.  Pt will complete BUE shoulder ROM testing reporting improvement to pain and tightness.  Baseline: noted pain and tightness B with testing at eval Goal status:  INITIAL   ASSESSMENT:  CLINICAL IMPRESSION: Pt demonstrates good understanding of HEP as needed to progress towards goals. Pt tolerated OT session tasks well today and demo'd understanding of education. Will continue to monitor R shoulder pain and use of L thumb brace and modify recommendations as needed. Pt would benefit from skilled OT services to address deficits, increase overall independence, and return patient to PLOF as able.   PERFORMANCE DEFICITS: in functional skills including ADLs, IADLs, coordination, sensation, strength, pain, Fine motor control, endurance, decreased knowledge of use of DME, and UE functional use.   IMPAIRMENTS: are limiting patient from ADLs, IADLs, rest and sleep, work, and leisure.   COMORBIDITIES: may have co-morbidities  that affects occupational performance. Patient will benefit from skilled OT to address above impairments and improve overall function.  REHAB POTENTIAL: Fair given chronicity of symptoms  PLAN:  OT FREQUENCY: 1x/week  OT DURATION: 8 weeks  PLANNED INTERVENTIONS: self care/ADL training, therapeutic exercise, therapeutic activity, neuromuscular re-education, manual therapy, passive range of motion, splinting, electrical stimulation, ultrasound, paraffin, fluidotherapy, moist heat, contrast bath, patient/family education, DME and/or AE instructions, and Re-evaluation  RECOMMENDED OTHER SERVICES: none at this time  CONSULTED AND AGREED WITH PLAN OF CARE: Patient  PLAN FOR NEXT SESSION:  Review ergonomics, joint protection principles and incorporate ergonomics, joint protection principles in ADL/IADL skills practice  Review tendon glides and sleep positioning (how is L thumb brace doing?)  Wynetta Emery, OT 12/28/2022, 11:08 AM

## 2023-01-04 ENCOUNTER — Ambulatory Visit: Payer: Medicare Other | Admitting: Occupational Therapy

## 2023-01-04 DIAGNOSIS — M6281 Muscle weakness (generalized): Secondary | ICD-10-CM | POA: Diagnosis not present

## 2023-01-04 DIAGNOSIS — M25611 Stiffness of right shoulder, not elsewhere classified: Secondary | ICD-10-CM | POA: Diagnosis not present

## 2023-01-04 DIAGNOSIS — R29818 Other symptoms and signs involving the nervous system: Secondary | ICD-10-CM | POA: Diagnosis not present

## 2023-01-04 DIAGNOSIS — R208 Other disturbances of skin sensation: Secondary | ICD-10-CM | POA: Diagnosis not present

## 2023-01-04 DIAGNOSIS — M25612 Stiffness of left shoulder, not elsewhere classified: Secondary | ICD-10-CM

## 2023-01-04 DIAGNOSIS — M79602 Pain in left arm: Secondary | ICD-10-CM

## 2023-01-04 DIAGNOSIS — M79601 Pain in right arm: Secondary | ICD-10-CM

## 2023-01-04 NOTE — Patient Instructions (Addendum)

## 2023-01-04 NOTE — Therapy (Signed)
OUTPATIENT OCCUPATIONAL THERAPY ORTHO TREATMENT  Patient Name: Crystal Waters MRN: [id number] DOB:07-Mar-1956, 67 y.o., female Today's Date: 01/04/2023  PCP: Renaye Rakers, MD  REFERRING PROVIDER: Renaye Rakers, MD  END OF SESSION:  OT End of Session - 01/04/23 1104     Visit Number 4    Number of Visits 9    Date for OT Re-Evaluation 02/15/23    Authorization Type [employer] Medicare    Progress Note Due on Visit 9    OT Start Time 1105    OT Stop Time 1147    OT Time Calculation (min) 42 min    Activity Tolerance Patient tolerated treatment well    Behavior During Therapy WFL for tasks assessed/performed              Past Medical History:  Diagnosis Date   Allergy    gluten, seasonal   Anxiety    Arthritis    Asthma    seasonal    Cancer (HCC)    Thyroid   Complication of anesthesia    Constipation    Depression    GERD (gastroesophageal reflux disease)    Gestational diabetes mellitus    with 1 child.     H/O nose injury    accidently hit in the nose, has a stuffy nose, can't lie flat, wears nasal strips at night   History of kidney stones      x 2 passed   Hyperlipidemia    Hypertension    Hypothyroidism    Kidney stones    PONV (postoperative nausea and vomiting)    PTSD (post-traumatic stress disorder)    Sleep apnea    Thyroid cancer (HCC)    Thyroid disease    Past Surgical History:  Procedure Laterality Date   CHOLECYSTECTOMY N/A 04/11/2016   Procedure: LAPAROSCOPIC CHOLECYSTECTOMY WITH INTRAOPERATIVE CHOLANGIOGRAM;  Surgeon: Violeta Gelinas, MD;  Location: [facility] OR;  Service: General;  Laterality: N/A;   COLONOSCOPY     KNEE SURGERY Right 2009   LAPAROTOMY N/A 04/17/2016   Procedure: LAPAROTOMY REPAIR OF INCARCERATED INCISIONAL HERNIA WITH VAC PLACEMENT;  Surgeon: Emelia Loron, MD;  Location: [facility] OR;  Service: General;  Laterality: N/A;   REPLACEMENT TOTAL KNEE Bilateral 2021   THYROID SURGERY     2001   TUBAL LIGATION     Patient Active  Problem List   Diagnosis Date Noted   Controlled substance agreement signed 08/25/2020   Mixed hyperlipidemia 05/11/2020   Stress at work 11/11/2018   Candidiasis of skin 09/12/2018   ADD (attention deficit disorder) 05/06/2018   Postsurgical hypothyroidism 09/18/2017   History of thyroid cancer 09/18/2017   GERD (gastroesophageal reflux disease) 12/24/2016   Incarcerated incisional hernia 04/18/2016   Left shoulder pain 10/03/2015   Prediabetes 02/07/2015   Atypical nevi 10/29/2013   OA (osteoarthritis) of knee 01/15/2013   Class 2 obesity 01/15/2013   Essential hypertension, benign 01/15/2013   Chronic insomnia 01/15/2013   Major depressive disorder 01/15/2013   GAD (generalized anxiety disorder) 01/15/2013    ONSET DATE: 10/23/2022 (date of referral)  REFERRING DIAG: G62.9 (ICD-10-CM) - Polyneuropathy, unspecified  THERAPY DIAG:  Other symptoms and signs involving the nervous system  Other disturbances of skin sensation  Pain in right arm  Pain in left arm  Stiffness of left shoulder, not elsewhere classified  Stiffness of right shoulder, not elsewhere classified  Muscle weakness (generalized)  Rationale for Evaluation and Treatment: Rehabilitation  SUBJECTIVE:   SUBJECTIVE STATEMENT: Brace is still helping and  she completes her tendon glides she has difficulty remembering to do her shoulder exercises.   Pt accompanied by: self  PERTINENT HISTORY: PMH: Anxiety, Depression, HLD, HTN, PTSD, thyroid cancer, ADD   PRECAUTIONS: None  WEIGHT BEARING RESTRICTIONS: No  PAIN:  Are you having pain? No  FALLS: Has patient fallen in last 6 months? No  LIVING ENVIRONMENT: Lives with: lives with their son Lives in: House/apartment Stairs: Yes: External: 1 steps; none Has following equipment at home: Single point cane and Walker - 2 wheeled  PLOF: Independent; driving; working part-time in customer care  PATIENT GOALS: Improve pain and functional use of  BUE  OBJECTIVE:   HAND DOMINANCE: Right  ADLs: Overall ADLs: mod I  FUNCTIONAL OUTCOME MEASURES: Quick Dash: 25.0 %   UPPER EXTREMITY ROM:     Generally WFL though with pain/tightness in R shoulder  UPPER EXTREMITY MMT:     BUE WFL though reports R shoulder pain  HAND FUNCTION: Grip strength: Right: 58.2 lbs; Left: 50.4 lbs  COORDINATION: 9 Hole Peg test: Right: 22 sec; Left: 23 sec  SENSATION: Worse in L hand , reports numbness, but no tingling or pins and needles  EDEMA: none reported or observed  COGNITION: Overall cognitive status: Within functional limits for tasks assessed  OBSERVATIONS:  Fatty deposit R lateral forearm. Pt appears well-kept. Ambulates without AD or LOB.   TODAY'S TREATMENT:                                                                                                                              OT reviewed previously issued previously issued joint protection principles and updated goals.   OT educated pt on joint protection, ergonomics, and Optometrist principles as noted in pt instructions as needed to improve UE pain.   OT educated pt on use of heat for UE pain management as noted in pt instructions.   PATIENT EDUCATION: Education details: See tx note above Person educated: Patient Education method: Explanation, Demonstration, and Handouts Education comprehension: verbalized understanding, returned demonstration, and needs further education  HOME EXERCISE PROGRAM: 12/18/2022: L Tendon glides, R shoulder ROM; sleep positioning 12/28/2022: BUE Shoulder ROM HEP. Access code: [email or web address] 01/04/2023: joint protection, ergonomics, and Optometrist principles  GOALS:  SHORT TERM GOALS: Target date: 01/04/2023    Patient will demonstrate independence with initial BUE HEP. Baseline: Goal status: MET  2.  Pt will independently recall at least 2 pain reduction strategies for management of BUE pain.  Baseline: moderate BUE pain Goal  status: IN progress  3.  Pt will independently recall at least 3 joint protection, ergonomics, and body mechanic principles as noted in pt instructions.   Baseline:  Goal status: IN progress  LONG TERM GOALS: Target date: 02/15/2023  Patient will demonstrate updated RUE and LUE HEP with 25% verbal cues or less for proper execution. Baseline:  Goal status: INITIAL  2.  Pt will report no more than moderate difficulty opening  and tight or new jar using AD as needed.  Baseline: unable Goal status: INITIAL  3.  Pt will complete BUE shoulder ROM testing reporting improvement to pain and tightness.  Baseline: noted pain and tightness B with testing at eval Goal status: INITIAL   ASSESSMENT:  CLINICAL IMPRESSION: Pt has decent recall of HEP and joint protection strategies  as needed to improve UE pain. Pt progressing well towards goals and will likely be able to d/c early.  PERFORMANCE DEFICITS: in functional skills including ADLs, IADLs, coordination, sensation, strength, pain, Fine motor control, endurance, decreased knowledge of use of DME, and UE functional use.   IMPAIRMENTS: are limiting patient from ADLs, IADLs, rest and sleep, work, and leisure.   COMORBIDITIES: may have co-morbidities  that affects occupational performance. Patient will benefit from skilled OT to address above impairments and improve overall function.  REHAB POTENTIAL: Fair given chronicity of symptoms  PLAN:  OT FREQUENCY: 1x/week  OT DURATION: 8 weeks  PLANNED INTERVENTIONS: self care/ADL training, therapeutic exercise, therapeutic activity, neuromuscular re-education, manual therapy, passive range of motion, splinting, electrical stimulation, ultrasound, paraffin, fluidotherapy, moist heat, contrast bath, patient/family education, DME and/or AE instructions, and Re-evaluation  RECOMMENDED OTHER SERVICES: none at this time  CONSULTED AND AGREED WITH PLAN OF CARE: Patient  PLAN FOR NEXT SESSION:   Review ergonomics, joint protection principles and incorporate ergonomics, joint protection principles in ADL/IADL skills practice (UPDATE GOAL)  Light theraband HEP for UE as tolerated; UBE   Delana Meyer, OT 01/04/2023, 2:37 PM

## 2023-01-11 ENCOUNTER — Ambulatory Visit: Payer: Medicare Other | Admitting: Occupational Therapy

## 2023-01-11 NOTE — Therapy (Deleted)
OUTPATIENT OCCUPATIONAL THERAPY ORTHO TREATMENT  Patient Name: Crystal Waters MRN: [id number] DOB:Nov 28, 1955, 67 y.o., female Today's Date: 01/11/2023  PCP: Renaye Rakers, MD  REFERRING PROVIDER: Renaye Rakers, MD  END OF SESSION:     Past Medical History:  Diagnosis Date   Allergy    gluten, seasonal   Anxiety    Arthritis    Asthma    seasonal    Cancer [facility])    Thyroid   Complication of anesthesia    Constipation    Depression    GERD (gastroesophageal reflux disease)    Gestational diabetes mellitus    with 1 child.     H/O nose injury    accidently hit in the nose, has a stuffy nose, can't lie flat, wears nasal strips at night   History of kidney stones      x 2 passed   Hyperlipidemia    Hypertension    Hypothyroidism    Kidney stones    PONV (postoperative nausea and vomiting)    PTSD (post-traumatic stress disorder)    Sleep apnea    Thyroid cancer (HCC)    Thyroid disease    Past Surgical History:  Procedure Laterality Date   CHOLECYSTECTOMY N/A 04/11/2016   Procedure: LAPAROSCOPIC CHOLECYSTECTOMY WITH INTRAOPERATIVE CHOLANGIOGRAM;  Surgeon: Violeta Gelinas, MD;  Location: [facility] OR;  Service: General;  Laterality: N/A;   COLONOSCOPY     KNEE SURGERY Right 2009   LAPAROTOMY N/A 04/17/2016   Procedure: LAPAROTOMY REPAIR OF INCARCERATED INCISIONAL HERNIA WITH VAC PLACEMENT;  Surgeon: Emelia Loron, MD;  Location: [facility] OR;  Service: General;  Laterality: N/A;   REPLACEMENT TOTAL KNEE Bilateral 2021   THYROID SURGERY     2001   TUBAL LIGATION     Patient Active Problem List   Diagnosis Date Noted   Controlled substance agreement signed 08/25/2020   Mixed hyperlipidemia 05/11/2020   Stress at work 11/11/2018   Candidiasis of skin 09/12/2018   ADD (attention deficit disorder) 05/06/2018   Postsurgical hypothyroidism 09/18/2017   History of thyroid cancer 09/18/2017   GERD (gastroesophageal reflux disease) 12/24/2016   Incarcerated incisional hernia  04/18/2016   Left shoulder pain 10/03/2015   Prediabetes 02/07/2015   Atypical nevi 10/29/2013   OA (osteoarthritis) of knee 01/15/2013   Class 2 obesity 01/15/2013   Essential hypertension, benign 01/15/2013   Chronic insomnia 01/15/2013   Major depressive disorder 01/15/2013   GAD (generalized anxiety disorder) 01/15/2013    ONSET DATE: 10/23/2022 (date of referral)  REFERRING DIAG: G62.9 (ICD-10-CM) - Polyneuropathy, unspecified  THERAPY DIAG:  No diagnosis found.  Rationale for Evaluation and Treatment: Rehabilitation  SUBJECTIVE:   SUBJECTIVE STATEMENT: Brace is still helping and she completes her tendon glides she has difficulty remembering to do her shoulder exercises.   Pt accompanied by: self  PERTINENT HISTORY: PMH: Anxiety, Depression, HLD, HTN, PTSD, thyroid cancer, ADD   PRECAUTIONS: None  WEIGHT BEARING RESTRICTIONS: No  PAIN:  Are you having pain? No  FALLS: Has patient fallen in last 6 months? No  LIVING ENVIRONMENT: Lives with: lives with their son Lives in: House/apartment Stairs: Yes: External: 1 steps; none Has following equipment at home: Single point cane and Walker - 2 wheeled  PLOF: Independent; driving; working part-time in customer care  PATIENT GOALS: Improve pain and functional use of BUE  OBJECTIVE:   HAND DOMINANCE: Right  ADLs: Overall ADLs: mod I  FUNCTIONAL OUTCOME MEASURES: Quick Dash: 25.0 %   UPPER EXTREMITY ROM:  Generally WFL though with pain/tightness in R shoulder  UPPER EXTREMITY MMT:     BUE WFL though reports R shoulder pain  HAND FUNCTION: Grip strength: Right: 58.2 lbs; Left: 50.4 lbs  COORDINATION: 9 Hole Peg test: Right: 22 sec; Left: 23 sec  SENSATION: Worse in L hand , reports numbness, but no tingling or pins and needles  EDEMA: none reported or observed  COGNITION: Overall cognitive status: Within functional limits for tasks assessed  OBSERVATIONS:  Fatty deposit R lateral forearm. Pt  appears well-kept. Ambulates without AD or LOB.   TODAY'S TREATMENT:                                                                                                                              OT reviewed previously issued previously issued joint protection principles and updated goals.   OT educated pt on joint protection, ergonomics, and Optometrist principles as noted in pt instructions as needed to improve UE pain.   OT educated pt on use of heat for UE pain management as noted in pt instructions.   PATIENT EDUCATION: Education details: See tx note above Person educated: Patient Education method: Explanation, Demonstration, and Handouts Education comprehension: verbalized understanding, returned demonstration, and needs further education  HOME EXERCISE PROGRAM: 12/18/2022: L Tendon glides, R shoulder ROM; sleep positioning 12/28/2022: BUE Shoulder ROM HEP. Access code: [email or web address] 01/04/2023: joint protection, ergonomics, and Optometrist principles  GOALS:  SHORT TERM GOALS: Target date: 01/04/2023    Patient will demonstrate independence with initial BUE HEP. Baseline: Goal status: MET  2.  Pt will independently recall at least 2 pain reduction strategies for management of BUE pain.  Baseline: moderate BUE pain Goal status: IN progress  3.  Pt will independently recall at least 3 joint protection, ergonomics, and body mechanic principles as noted in pt instructions.   Baseline:  Goal status: IN progress  LONG TERM GOALS: Target date: 02/15/2023  Patient will demonstrate updated RUE and LUE HEP with 25% verbal cues or less for proper execution. Baseline:  Goal status: INITIAL  2.  Pt will report no more than moderate difficulty opening and tight or new jar using AD as needed.  Baseline: unable Goal status: INITIAL  3.  Pt will complete BUE shoulder ROM testing reporting improvement to pain and tightness.  Baseline: noted pain and tightness B with testing at  eval Goal status: INITIAL   ASSESSMENT:  CLINICAL IMPRESSION: Pt has decent recall of HEP and joint protection strategies  as needed to improve UE pain. Pt progressing well towards goals and will likely be able to d/c early.  PERFORMANCE DEFICITS: in functional skills including ADLs, IADLs, coordination, sensation, strength, pain, Fine motor control, endurance, decreased knowledge of use of DME, and UE functional use.   IMPAIRMENTS: are limiting patient from ADLs, IADLs, rest and sleep, work, and leisure.   COMORBIDITIES: may have co-morbidities  that affects occupational performance. Patient will benefit from skilled  OT to address above impairments and improve overall function.  REHAB POTENTIAL: Fair given chronicity of symptoms  PLAN:  OT FREQUENCY: 1x/week  OT DURATION: 8 weeks  PLANNED INTERVENTIONS: self care/ADL training, therapeutic exercise, therapeutic activity, neuromuscular re-education, manual therapy, passive range of motion, splinting, electrical stimulation, ultrasound, paraffin, fluidotherapy, moist heat, contrast bath, patient/family education, DME and/or AE instructions, and Re-evaluation  RECOMMENDED OTHER SERVICES: none at this time  CONSULTED AND AGREED WITH PLAN OF CARE: Patient  PLAN FOR NEXT SESSION:  Review ergonomics, joint protection principles and incorporate ergonomics, joint protection principles in ADL/IADL skills practice (UPDATE GOAL)  Light theraband HEP for UE as tolerated; UBE   Delana Meyer, OT 01/11/2023, 10:25 AM

## 2023-01-15 ENCOUNTER — Encounter: Payer: Self-pay | Admitting: Occupational Therapy

## 2023-01-15 NOTE — Therapy (Signed)
Pt's mother has passed away. Pt requesting OT d/c at this time.

## 2023-01-18 ENCOUNTER — Encounter: Payer: Medicare Other | Admitting: Occupational Therapy

## 2023-01-25 ENCOUNTER — Encounter: Payer: Medicare Other | Admitting: Occupational Therapy

## 2023-01-25 DIAGNOSIS — E890 Postprocedural hypothyroidism: Secondary | ICD-10-CM | POA: Diagnosis not present

## 2023-01-25 DIAGNOSIS — Z85850 Personal history of malignant neoplasm of thyroid: Secondary | ICD-10-CM | POA: Diagnosis not present

## 2023-02-01 ENCOUNTER — Encounter: Payer: Medicare Other | Admitting: Occupational Therapy

## 2023-02-01 DIAGNOSIS — L130 Dermatitis herpetiformis: Secondary | ICD-10-CM | POA: Diagnosis not present

## 2023-02-01 DIAGNOSIS — E890 Postprocedural hypothyroidism: Secondary | ICD-10-CM | POA: Diagnosis not present

## 2023-02-01 DIAGNOSIS — Z85850 Personal history of malignant neoplasm of thyroid: Secondary | ICD-10-CM | POA: Diagnosis not present

## 2023-02-01 DIAGNOSIS — K900 Celiac disease: Secondary | ICD-10-CM | POA: Diagnosis not present

## 2023-02-01 DIAGNOSIS — R7303 Prediabetes: Secondary | ICD-10-CM | POA: Diagnosis not present

## 2023-02-08 ENCOUNTER — Encounter: Payer: Medicare Other | Admitting: Occupational Therapy

## 2023-03-08 DIAGNOSIS — G629 Polyneuropathy, unspecified: Secondary | ICD-10-CM | POA: Diagnosis not present

## 2023-03-08 DIAGNOSIS — M130 Polyarthritis, unspecified: Secondary | ICD-10-CM | POA: Diagnosis not present

## 2023-03-08 DIAGNOSIS — G4730 Sleep apnea, unspecified: Secondary | ICD-10-CM | POA: Diagnosis not present

## 2023-04-12 ENCOUNTER — Encounter: Payer: Medicare Other | Admitting: Dietician

## 2023-07-25 DIAGNOSIS — R7303 Prediabetes: Secondary | ICD-10-CM | POA: Diagnosis not present

## 2023-07-25 DIAGNOSIS — I10 Essential (primary) hypertension: Secondary | ICD-10-CM | POA: Diagnosis not present

## 2023-11-06 ENCOUNTER — Other Ambulatory Visit: Payer: Self-pay | Admitting: Family Medicine

## 2023-11-06 DIAGNOSIS — Z1231 Encounter for screening mammogram for malignant neoplasm of breast: Secondary | ICD-10-CM

## 2023-11-22 ENCOUNTER — Ambulatory Visit
Admission: RE | Admit: 2023-11-22 | Discharge: 2023-11-22 | Disposition: A | Discharge status: Home / Self Care | Source: Ambulatory Visit | Attending: Family Medicine | Admitting: Family Medicine

## 2023-11-22 DIAGNOSIS — Z1231 Encounter for screening mammogram for malignant neoplasm of breast: Secondary | ICD-10-CM

## 2023-12-04 DIAGNOSIS — Z0000 Encounter for general adult medical examination without abnormal findings: Secondary | ICD-10-CM | POA: Diagnosis not present

## 2023-12-04 DIAGNOSIS — R5383 Other fatigue: Secondary | ICD-10-CM | POA: Diagnosis not present
